# Patient Record
Sex: Male | Born: 1955 | Hispanic: No | Smoking: Never smoker
Health system: Southern US, Community
[De-identification: ages and names within clinical notes are randomized; demographics above are authoritative.]

## PROBLEM LIST (undated history)

## (undated) DIAGNOSIS — E119 Type 2 diabetes mellitus without complications: Secondary | ICD-10-CM

## (undated) DIAGNOSIS — I10 Essential (primary) hypertension: Secondary | ICD-10-CM

## (undated) HISTORY — PX: HERNIA REPAIR: SHX51

---

## 2010-11-06 ENCOUNTER — Emergency Department: Payer: Self-pay | Admitting: Unknown Physician Specialty

## 2010-11-23 ENCOUNTER — Ambulatory Visit: Payer: Self-pay | Admitting: Family Medicine

## 2011-07-05 ENCOUNTER — Emergency Department: Payer: Self-pay | Admitting: *Deleted

## 2011-07-05 LAB — RAPID INFLUENZA A&B ANTIGENS

## 2011-07-07 ENCOUNTER — Emergency Department: Payer: Self-pay | Admitting: *Deleted

## 2011-07-07 LAB — CBC
HCT: 40.7 % (ref 40.0–52.0)
HGB: 14 g/dL (ref 13.0–18.0)
MCH: 29 pg (ref 26.0–34.0)
MCHC: 34.4 g/dL (ref 32.0–36.0)
MCV: 84 fL (ref 80–100)
Platelet: 203 10*3/uL (ref 150–440)
RBC: 4.82 10*6/uL (ref 4.40–5.90)
RDW: 13.1 % (ref 11.5–14.5)
WBC: 8.7 10*3/uL (ref 3.8–10.6)

## 2011-07-07 LAB — COMPREHENSIVE METABOLIC PANEL
Alkaline Phosphatase: 56 U/L (ref 50–136)
Anion Gap: 8 (ref 7–16)
Bilirubin,Total: 1.1 mg/dL — ABNORMAL HIGH (ref 0.2–1.0)
Calcium, Total: 9.2 mg/dL (ref 8.5–10.1)
Chloride: 96 mmol/L — ABNORMAL LOW (ref 98–107)
Co2: 31 mmol/L (ref 21–32)
Creatinine: 0.96 mg/dL (ref 0.60–1.30)
EGFR (African American): >60
Potassium: 4.6 mmol/L (ref 3.5–5.1)
SGOT(AST): 18 U/L (ref 15–37)
Sodium: 135 mmol/L — ABNORMAL LOW (ref 136–145)

## 2011-07-07 LAB — LIPASE, BLOOD: Lipase: 107 U/L (ref 73–393)

## 2011-07-08 ENCOUNTER — Emergency Department: Payer: Self-pay | Admitting: Emergency Medicine

## 2011-07-08 LAB — LIPASE, BLOOD: Lipase: 179 U/L (ref 73–393)

## 2011-07-08 LAB — COMPREHENSIVE METABOLIC PANEL
Alkaline Phosphatase: 52 U/L (ref 50–136)
Bilirubin,Total: 0.8 mg/dL (ref 0.2–1.0)
Calcium, Total: 9 mg/dL (ref 8.5–10.1)
Chloride: 100 mmol/L (ref 98–107)
Co2: 29 mmol/L (ref 21–32)
Osmolality: 288 (ref 275–301)
SGPT (ALT): 25 U/L
Sodium: 140 mmol/L (ref 136–145)

## 2011-07-08 LAB — CBC
HCT: 40.3 % (ref 40.0–52.0)
HGB: 13.8 g/dL (ref 13.0–18.0)
MCHC: 34.2 g/dL (ref 32.0–36.0)
MCV: 85 fL (ref 80–100)
RBC: 4.77 10*6/uL (ref 4.40–5.90)
WBC: 5.6 10*3/uL (ref 3.8–10.6)

## 2011-07-08 LAB — TROPONIN I: Troponin-I: <0.02 ng/mL

## 2011-07-25 ENCOUNTER — Emergency Department: Payer: Self-pay | Admitting: Emergency Medicine

## 2011-12-04 ENCOUNTER — Emergency Department: Payer: Self-pay | Admitting: Emergency Medicine

## 2011-12-06 ENCOUNTER — Emergency Department: Payer: Self-pay | Admitting: Emergency Medicine

## 2012-06-24 HISTORY — PX: BACK SURGERY: SHX140

## 2012-10-09 ENCOUNTER — Emergency Department: Payer: Self-pay | Admitting: Emergency Medicine

## 2012-10-09 LAB — COMPREHENSIVE METABOLIC PANEL
Alkaline Phosphatase: 70 U/L (ref 50–136)
Bilirubin,Total: 0.9 mg/dL (ref 0.2–1.0)
Calcium, Total: 9.3 mg/dL (ref 8.5–10.1)
Chloride: 102 mmol/L (ref 98–107)
Co2: 27 mmol/L (ref 21–32)
Creatinine: 0.95 mg/dL (ref 0.60–1.30)
Glucose: 131 mg/dL — ABNORMAL HIGH (ref 65–99)
Potassium: 4.2 mmol/L (ref 3.5–5.1)
SGPT (ALT): 24 U/L (ref 12–78)
Sodium: 136 mmol/L (ref 136–145)

## 2012-10-09 LAB — CBC
HCT: 45.9 % (ref 40.0–52.0)
HGB: 15.6 g/dL (ref 13.0–18.0)
MCH: 28.7 pg (ref 26.0–34.0)
MCHC: 34 g/dL (ref 32.0–36.0)
MCV: 84 fL (ref 80–100)
WBC: 10.3 10*3/uL (ref 3.8–10.6)

## 2012-10-09 LAB — URINALYSIS, COMPLETE
Bilirubin,UR: NEGATIVE
Glucose,UR: NEGATIVE mg/dL (ref 0–75)
Ketone: NEGATIVE
RBC,UR: 1 /HPF (ref 0–5)
Specific Gravity: 1.027 (ref 1.003–1.030)
WBC UR: <1 /HPF (ref 0–5)

## 2012-12-13 ENCOUNTER — Emergency Department: Payer: Self-pay | Admitting: Emergency Medicine

## 2012-12-13 LAB — CBC
HCT: 40 % (ref 40.0–52.0)
HGB: 13.7 g/dL (ref 13.0–18.0)
MCV: 83 fL (ref 80–100)
Platelet: 238 10*3/uL (ref 150–440)
RDW: 12.9 % (ref 11.5–14.5)
WBC: 7.1 10*3/uL (ref 3.8–10.6)

## 2012-12-13 LAB — COMPREHENSIVE METABOLIC PANEL
Albumin: 3.7 g/dL (ref 3.4–5.0)
Alkaline Phosphatase: 71 U/L (ref 50–136)
Anion Gap: 7 (ref 7–16)
BUN: 25 mg/dL — ABNORMAL HIGH (ref 7–18)
Bilirubin,Total: 0.5 mg/dL (ref 0.2–1.0)
Chloride: 102 mmol/L (ref 98–107)
Creatinine: 0.91 mg/dL (ref 0.60–1.30)
EGFR (Non-African Amer.): >60
Glucose: 243 mg/dL — ABNORMAL HIGH (ref 65–99)
Osmolality: 290 (ref 275–301)
Potassium: 3.9 mmol/L (ref 3.5–5.1)
SGOT(AST): 15 U/L (ref 15–37)
SGPT (ALT): 18 U/L (ref 12–78)
Total Protein: 7.6 g/dL (ref 6.4–8.2)

## 2012-12-13 LAB — URINALYSIS, COMPLETE
Bacteria: NONE SEEN
Bilirubin,UR: NEGATIVE
Blood: NEGATIVE
Glucose,UR: >=500 mg/dL (ref 0–75)
Nitrite: NEGATIVE
Ph: 5 (ref 4.5–8.0)
Protein: 30
RBC,UR: 3 /HPF (ref 0–5)
Specific Gravity: 1.026 (ref 1.003–1.030)
Squamous Epithelial: NONE SEEN
WBC UR: 1 /HPF (ref 0–5)

## 2012-12-13 LAB — LIPASE, BLOOD: Lipase: 216 U/L (ref 73–393)

## 2012-12-31 ENCOUNTER — Ambulatory Visit: Payer: Self-pay | Admitting: Surgery

## 2013-01-13 ENCOUNTER — Ambulatory Visit: Payer: Self-pay | Admitting: Surgery

## 2013-04-20 ENCOUNTER — Ambulatory Visit: Payer: Self-pay | Admitting: Family Medicine

## 2013-05-08 ENCOUNTER — Emergency Department (HOSPITAL_COMMUNITY)
Admission: EM | Admit: 2013-05-08 | Discharge: 2013-05-08 | Disposition: A | Discharge status: Home / Self Care | Payer: No Typology Code available for payment source | Attending: Emergency Medicine | Admitting: Emergency Medicine

## 2013-05-08 ENCOUNTER — Emergency Department (HOSPITAL_COMMUNITY): Payer: No Typology Code available for payment source

## 2013-05-08 ENCOUNTER — Encounter (HOSPITAL_COMMUNITY): Payer: Self-pay | Admitting: Emergency Medicine

## 2013-05-08 DIAGNOSIS — M51379 Other intervertebral disc degeneration, lumbosacral region without mention of lumbar back pain or lower extremity pain: Secondary | ICD-10-CM | POA: Insufficient documentation

## 2013-05-08 DIAGNOSIS — Y92410 Unspecified street and highway as the place of occurrence of the external cause: Secondary | ICD-10-CM | POA: Insufficient documentation

## 2013-05-08 DIAGNOSIS — M5136 Other intervertebral disc degeneration, lumbar region: Secondary | ICD-10-CM

## 2013-05-08 DIAGNOSIS — E119 Type 2 diabetes mellitus without complications: Secondary | ICD-10-CM | POA: Insufficient documentation

## 2013-05-08 DIAGNOSIS — S4980XA Other specified injuries of shoulder and upper arm, unspecified arm, initial encounter: Secondary | ICD-10-CM | POA: Insufficient documentation

## 2013-05-08 DIAGNOSIS — S46909A Unspecified injury of unspecified muscle, fascia and tendon at shoulder and upper arm level, unspecified arm, initial encounter: Secondary | ICD-10-CM | POA: Insufficient documentation

## 2013-05-08 DIAGNOSIS — M5030 Other cervical disc degeneration, unspecified cervical region: Secondary | ICD-10-CM

## 2013-05-08 DIAGNOSIS — M5137 Other intervertebral disc degeneration, lumbosacral region: Secondary | ICD-10-CM | POA: Insufficient documentation

## 2013-05-08 DIAGNOSIS — Y9389 Activity, other specified: Secondary | ICD-10-CM | POA: Insufficient documentation

## 2013-05-08 DIAGNOSIS — M51369 Other intervertebral disc degeneration, lumbar region without mention of lumbar back pain or lower extremity pain: Secondary | ICD-10-CM

## 2013-05-08 DIAGNOSIS — Z791 Long term (current) use of non-steroidal anti-inflammatories (NSAID): Secondary | ICD-10-CM | POA: Insufficient documentation

## 2013-05-08 HISTORY — DX: Type 2 diabetes mellitus without complications: E11.9

## 2013-05-08 MED ORDER — OXYCODONE-ACETAMINOPHEN 5-325 MG PO TABS
2.0000 | ORAL_TABLET | Freq: Once | ORAL | Status: AC
  Administered 2013-05-08: 2 via ORAL
  Filled 2013-05-08: qty 2

## 2013-05-08 MED ORDER — OXYCODONE-ACETAMINOPHEN 5-325 MG PO TABS: 1.0000 | ORAL_TABLET | Freq: Four times a day (QID) | ORAL | Status: DC | PRN

## 2013-05-08 MED ORDER — NAPROXEN 500 MG PO TABS: 500.0000 mg | ORAL_TABLET | Freq: Two times a day (BID) | ORAL | Status: DC
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
## 2013-05-08 NOTE — ED Notes (Signed)
Pt at xray. Family at bedside

## 2013-08-04 ENCOUNTER — Ambulatory Visit: Payer: Self-pay | Admitting: Family Medicine

## 2013-08-17 ENCOUNTER — Ambulatory Visit: Payer: Self-pay | Admitting: Neurosurgery

## 2013-11-05 DIAGNOSIS — S32009A Unspecified fracture of unspecified lumbar vertebra, initial encounter for closed fracture: Secondary | ICD-10-CM | POA: Insufficient documentation

## 2014-07-07 ENCOUNTER — Ambulatory Visit: Payer: Self-pay | Admitting: Family Medicine

## 2014-07-08 ENCOUNTER — Emergency Department: Payer: Self-pay | Admitting: Emergency Medicine

## 2014-07-08 LAB — COMPREHENSIVE METABOLIC PANEL
ALK PHOS: 68 U/L
AST: 13 U/L — AB (ref 15–37)
Albumin: 3.9 g/dL (ref 3.4–5.0)
Anion Gap: 9 (ref 7–16)
BILIRUBIN TOTAL: 1.1 mg/dL — AB (ref 0.2–1.0)
BUN: 26 mg/dL — AB (ref 7–18)
CREATININE: 0.94 mg/dL (ref 0.60–1.30)
Calcium, Total: 9.2 mg/dL (ref 8.5–10.1)
Chloride: 102 mmol/L (ref 98–107)
Co2: 28 mmol/L (ref 21–32)
EGFR (African American): >60
EGFR (Non-African Amer.): >60
Glucose: 252 mg/dL — ABNORMAL HIGH (ref 65–99)
Osmolality: 291 (ref 275–301)
Potassium: 4.1 mmol/L (ref 3.5–5.1)
SGPT (ALT): 17 U/L
Sodium: 139 mmol/L (ref 136–145)
Total Protein: 7.9 g/dL (ref 6.4–8.2)

## 2014-07-08 LAB — CBC WITH DIFFERENTIAL/PLATELET
BASOS ABS: 0 10*3/uL (ref 0.0–0.1)
Basophil %: 0.6 %
Eosinophil #: 0.2 10*3/uL (ref 0.0–0.7)
Eosinophil %: 2.6 %
HCT: 40.6 % (ref 40.0–52.0)
HGB: 13.6 g/dL (ref 13.0–18.0)
Lymphocyte #: 1.1 10*3/uL (ref 1.0–3.6)
Lymphocyte %: 17.3 %
MCH: 28.5 pg (ref 26.0–34.0)
MCHC: 33.4 g/dL (ref 32.0–36.0)
MCV: 85 fL (ref 80–100)
MONOS PCT: 7.6 %
Monocyte #: 0.5 x10 3/mm (ref 0.2–1.0)
Neutrophil #: 4.7 10*3/uL (ref 1.4–6.5)
Neutrophil %: 71.9 %
PLATELETS: 190 10*3/uL (ref 150–440)
RBC: 4.77 10*6/uL (ref 4.40–5.90)
RDW: 14.5 % (ref 11.5–14.5)
WBC: 6.5 10*3/uL (ref 3.8–10.6)

## 2014-07-08 LAB — URIC ACID: URIC ACID: 3.7 mg/dL (ref 3.5–7.2)

## 2014-07-08 LAB — SEDIMENTATION RATE: Erythrocyte Sed Rate: 55 mm/hr — ABNORMAL HIGH (ref 0–20)

## 2014-10-14 NOTE — Op Note (Signed)
PATIENT NAME:  Daniel Powell, Daniel Powell MR#:  [id number] DATE OF BIRTH:  March 07, 1956  DATE OF PROCEDURE:  01/13/2013  ATTENDING PHYSICIAN: Daniel Gave A. Warrick Llera, MD  ASSISTANT: Daniel Goldmann III, MD  PREOPERATIVE DIAGNOSES: Left inguinal hernia, umbilical hernia.  POSTOPERATIVE DIAGNOSES: Left indirect inguinal hernia, umbilical hernia.  PROCEDURE PERFORMED:  1. Robotic left inguinal hernia repair with mesh, transabdominal preperitoneal.  2. Umbilical hernia repair.   ESTIMATED BLOOD LOSS: 10 mL.  COMPLICATIONS: None.   SPECIMENS: None.   ANESTHESIA: General.   INDICATION FOR SURGERY: Daniel Powell is a pleasant 59 year old male with a long history of a left inguinal hernia which has currently gotten worse. Due to the size of it, I thought that he would benefit from robotic-assisted laparoscopic inguinal hernia repair.   DETAILS OF PROCEDURE: As follows: Informed consent was obtained. Daniel Powell was brought to the operating room suite. He was placed upon the operating room table. He was induced, endotracheal tube was placed, general anesthesia was administered. A timeout was then performed correctly identifying the patient's name, operative site and procedure to be performed. A supraumbilical incision was made. This was deepened down to the fascia. The fascia was incised. The peritoneum was entered. I did not go through the previous hernia. I later would return to fix this. I did place the laparoscopic 8 mm trocar through the umbilical incision. I then, under direct visualization, placed two 8 mm robotic trocars, on the left approximately 1 handbreadth left of the umbilicus and 1 handbreadth to the right of the umbilicus. I then placed a more superior 10 mm trocar on the left side. I then docked the robot and made an incision in the peritoneum above the hernia. There was a large indirect hernia with the large sac. I then carefully dissected the peritoneum off the abdominal wall and dissected out the indirect  sac. Once it was reduced, I dissected out Cooper's and then repaired the hernia defect with a single piece of 6 x 4 cm polypropylene mesh, and this was tacked medially to Cooper's ligament, superior to the abdominal wall to cover all 3 defects. I closed the peritoneal flap over the mesh and tacked those in place. I then removed the trocars and undocked the robot. I then went around Daniel Powell's umbilicus and isolated the umbilical hernia defect. It was quite small, and it did appear to be scarred down. It did not warrant mesh, and therefore I closed the umbilical defect as well as my umbilical port site with figure-of-eight 0 Vicryl. I then closed the left upper quadrant umbilical port site fascia with a single 0 Vicryl figure-of-eight. I then closed all incisions with 4-0 Monocryl deep dermal sutures and placed Dermabond over all incisions. The patient was then awoken, extubated and brought to the postanesthesia care unit. There were no immediate complications. Needle, sponge and instrument counts were correct at the end of the procedure.   ____________________________ Daniel Powell. Daniel Sing, MD cal:OSi D: 01/13/2013 12:07:31 ET T: 01/13/2013 13:39:20 ET JOB#: [id number]  cc: Daniel Gave A. Sang Blount, MD, <Dictator> Floyde Parkins MD ELECTRONICALLY SIGNED 01/16/2013 11:36

## 2014-10-21 ENCOUNTER — Other Ambulatory Visit: Payer: Self-pay | Admitting: Family Medicine

## 2014-10-21 DIAGNOSIS — R519 Headache, unspecified: Secondary | ICD-10-CM

## 2014-10-21 DIAGNOSIS — R51 Headache: Principal | ICD-10-CM

## 2014-10-28 ENCOUNTER — Ambulatory Visit
Admission: RE | Admit: 2014-10-28 | Discharge: 2014-10-28 | Disposition: A | Discharge status: Home / Self Care | Payer: BLUE CROSS/BLUE SHIELD | Source: Ambulatory Visit | Attending: Family Medicine | Admitting: Family Medicine

## 2014-10-28 ENCOUNTER — Encounter (INDEPENDENT_AMBULATORY_CARE_PROVIDER_SITE_OTHER): Payer: Self-pay

## 2014-10-28 DIAGNOSIS — R519 Headache, unspecified: Secondary | ICD-10-CM

## 2014-10-28 DIAGNOSIS — R51 Headache: Secondary | ICD-10-CM | POA: Diagnosis not present

## 2014-10-28 MED ORDER — GADOBENATE DIMEGLUMINE 529 MG/ML IV SOLN
15.0000 mL | Freq: Once | INTRAVENOUS | Status: AC | PRN
  Administered 2014-10-28: 14 mL via INTRAVENOUS

## 2014-11-25 ENCOUNTER — Ambulatory Visit: Payer: Self-pay

## 2014-11-30 ENCOUNTER — Encounter (INDEPENDENT_AMBULATORY_CARE_PROVIDER_SITE_OTHER): Payer: Self-pay | Admitting: Ophthalmology

## 2015-02-21 ENCOUNTER — Ambulatory Visit (INDEPENDENT_AMBULATORY_CARE_PROVIDER_SITE_OTHER): Payer: BLUE CROSS/BLUE SHIELD | Admitting: Family Medicine

## 2015-02-21 ENCOUNTER — Encounter: Payer: Self-pay | Admitting: Family Medicine

## 2015-02-21 VITALS — BP 128/66 | HR 74 | Temp 97.5°F | Resp 16 | Ht 66.0 in | Wt 155.5 lb

## 2015-02-21 DIAGNOSIS — L739 Follicular disorder, unspecified: Secondary | ICD-10-CM | POA: Diagnosis not present

## 2015-02-21 MED ORDER — CEPHALEXIN 500 MG PO CAPS: 500.0000 mg | ORAL_CAPSULE | Freq: Four times a day (QID) | ORAL | Status: DC

## 2015-02-21 NOTE — Progress Notes (Signed)
Name: Daniel Powell   MRN: [id number]    DOB: [birth year]   Date:02/21/2015       Progress Note  Subjective  Chief Complaint  Chief Complaint  Patient presents with  . Rash    patient presents with rash on the back of his scalp, on top of his scalp, bilateral arms. patient states the area is painful, itchy and it burns.  patient has tried some otc liquid and creams.  . Medication Refill    for diabetes and cholesterol. patient is requesting a 90 day supply.    Rash This is a new problem. The current episode started in the past 7 days. The affected locations include the head and scalp. The rash is characterized by blistering and redness. Pertinent negatives include no fever. Past treatments include nothing.    Past Medical History  Diagnosis Date  . Diabetes mellitus without complication     History reviewed. No pertinent past surgical history.  History reviewed. No pertinent family history.  Social History   Social History  . Marital Status: Married    Spouse Name: N/A  . Number of Children: N/A  . Years of Education: N/A   Occupational History  . Not on file.   Social History Main Topics  . Smoking status: Never Smoker   . Smokeless tobacco: Not on file  . Alcohol Use: No  . Drug Use: No  . Sexual Activity: Not on file   Other Topics Concern  . Not on file   Social History Narrative     Current outpatient prescriptions:  .  glimepiride (AMARYL) 4 MG tablet, Take 4 mg by mouth., Disp: , Rfl:  .  metFORMIN (GLUMETZA) 1000 MG (MOD) 24 hr tablet, Take 1,000 mg by mouth., Disp: , Rfl:  .  naproxen (NAPROSYN) 500 MG tablet, , Disp: , Rfl:  .  oxyCODONE (OXY IR/ROXICODONE) 5 MG immediate release tablet, Take 5 mg by mouth., Disp: , Rfl:  .  oxyCODONE-acetaminophen (PERCOCET/ROXICET) 5-325 MG per tablet, , Disp: , Rfl:  .  simvastatin (ZOCOR) 40 MG tablet, Take 40 mg by mouth., Disp: , Rfl:   Allergies  Allergen Reactions  . Gadolinium Derivatives Itching    Pt  began itching across ribs and back and neck s/p Multihance injection    Review of Systems  Constitutional: Negative for fever.  Skin: Positive for itching and rash.    Objective  Filed Vitals:   02/21/15 1231  BP: 128/66  Pulse: 74  Temp: 97.5 F (36.4 C)  TempSrc: Oral  Resp: 16  Height: 5\' 6"  (1.676 m)  Weight: 155 lb 8 oz (70.534 kg)  SpO2: 97%    Physical Exam  Constitutional: He is well-developed, well-nourished, and in no distress.  Skin: Rash noted. Rash is pustular and vesicular.  Non-tender indurated, pustular lesions over the occipital scalp. Some lesions are scabbed over, some have dried pus.  Nursing note and vitals reviewed.  Assessment & Plan  1. Folliculitis  - cephALEXin (KEFLEX) 500 MG capsule; Take 1 capsule (500 mg total) by mouth 4 (four) times daily.  Dispense: 40 capsule; Refill: 0   [name] [facility] 02/21/2015 1:30 PM

## 2015-02-28 ENCOUNTER — Ambulatory Visit: Payer: BLUE CROSS/BLUE SHIELD | Admitting: Family Medicine

## 2015-03-01 ENCOUNTER — Encounter: Payer: Self-pay | Admitting: Family Medicine

## 2015-03-01 ENCOUNTER — Ambulatory Visit: Payer: BLUE CROSS/BLUE SHIELD | Admitting: Family Medicine

## 2015-03-01 ENCOUNTER — Ambulatory Visit (INDEPENDENT_AMBULATORY_CARE_PROVIDER_SITE_OTHER): Payer: BLUE CROSS/BLUE SHIELD | Admitting: Family Medicine

## 2015-03-01 VITALS — BP 130/68 | HR 74 | Temp 97.6°F | Resp 18 | Ht 66.0 in | Wt 156.1 lb

## 2015-03-01 DIAGNOSIS — Z23 Encounter for immunization: Secondary | ICD-10-CM | POA: Diagnosis not present

## 2015-03-01 DIAGNOSIS — E1165 Type 2 diabetes mellitus with hyperglycemia: Secondary | ICD-10-CM | POA: Diagnosis not present

## 2015-03-01 DIAGNOSIS — E785 Hyperlipidemia, unspecified: Secondary | ICD-10-CM | POA: Insufficient documentation

## 2015-03-01 DIAGNOSIS — IMO0002 Reserved for concepts with insufficient information to code with codable children: Secondary | ICD-10-CM | POA: Insufficient documentation

## 2015-03-01 DIAGNOSIS — R51 Headache: Secondary | ICD-10-CM

## 2015-03-01 DIAGNOSIS — R519 Headache, unspecified: Secondary | ICD-10-CM | POA: Insufficient documentation

## 2015-03-01 DIAGNOSIS — M546 Pain in thoracic spine: Secondary | ICD-10-CM | POA: Insufficient documentation

## 2015-03-01 MED ORDER — SIMVASTATIN 40 MG PO TABS: 40.0000 mg | ORAL_TABLET | Freq: Every day | ORAL | Status: DC

## 2015-03-01 MED ORDER — GLIMEPIRIDE 4 MG PO TABS: 4.0000 mg | ORAL_TABLET | Freq: Every day | ORAL | Status: DC

## 2015-03-01 MED ORDER — LINAGLIPTIN 5 MG PO TABS: 5.0000 mg | ORAL_TABLET | Freq: Every day | ORAL | Status: DC

## 2015-03-01 MED ORDER — METFORMIN HCL 1000 MG PO TABS: 1000.0000 mg | ORAL_TABLET | Freq: Two times a day (BID) | ORAL | Status: DC

## 2015-03-01 NOTE — Progress Notes (Signed)
Name: Daniel Powell   MRN: [id number]    DOB: January 16, 1956   Date:03/01/2015       Progress Note  Subjective  Chief Complaint  Chief Complaint  Patient presents with  . Follow-up    1 wk  . Diabetes    Diabetes He presents for his follow-up diabetic visit. He has type 2 diabetes mellitus. His disease course has been stable. Pertinent negatives for diabetes include no weight loss. Current diabetic treatment includes oral agent (dual therapy). He is following a diabetic diet.  Hyperlipidemia This is a chronic problem. The problem is uncontrolled. Recent lipid tests were reviewed and are normal (elevated TG). Pertinent negatives include no leg pain, myalgias or shortness of breath. Current antihyperlipidemic treatment includes statins.  Back Pain This is a chronic problem. The pain is present in the lumbar spine and thoracic spine. The pain does not radiate. The pain is at a severity of 6/10. The pain is the same all the time. The symptoms are aggravated by sitting. Associated symptoms include numbness (numbness and tingling in his legs.). Pertinent negatives include no fever, leg pain or weight loss.  Headache Pt. Has persistent headaches and wishes to be referred to neurology.patient also claims to have 'dementia.' Of note, previous workup for headaches have included a normal MRI of the brain.    Past Medical History  Diagnosis Date  . Diabetes mellitus without complication     History reviewed. No pertinent past surgical history.  History reviewed. No pertinent family history.  Social History   Social History  . Marital Status: Married    Spouse Name: N/A  . Number of Children: N/A  . Years of Education: N/A   Occupational History  . Not on file.   Social History Main Topics  . Smoking status: Never Smoker   . Smokeless tobacco: Not on file  . Alcohol Use: No  . Drug Use: No  . Sexual Activity: Not on file   Other Topics Concern  . Not on file   Social History  Narrative     Current outpatient prescriptions:  .  cephALEXin (KEFLEX) 500 MG capsule, Take 1 capsule (500 mg total) by mouth 4 (four) times daily., Disp: 40 capsule, Rfl: 0 .  glimepiride (AMARYL) 4 MG tablet, Take 4 mg by mouth., Disp: , Rfl:  .  metFORMIN (GLUMETZA) 1000 MG (MOD) 24 hr tablet, Take 1,000 mg by mouth., Disp: , Rfl:  .  naproxen (NAPROSYN) 500 MG tablet, , Disp: , Rfl:  .  oxyCODONE (OXY IR/ROXICODONE) 5 MG immediate release tablet, Take 5 mg by mouth., Disp: , Rfl:  .  oxyCODONE-acetaminophen (PERCOCET/ROXICET) 5-325 MG per tablet, , Disp: , Rfl:  .  simvastatin (ZOCOR) 40 MG tablet, Take 40 mg by mouth., Disp: , Rfl:   Allergies  Allergen Reactions  . Gadolinium Derivatives Itching    Pt began itching across ribs and back and neck s/p Multihance injection     Review of Systems  Constitutional: Negative for fever, chills, weight loss and malaise/fatigue.  Respiratory: Negative for shortness of breath.   Musculoskeletal: Positive for back pain. Negative for myalgias.  Neurological: Positive for numbness (numbness and tingling in his legs.).   Objective  Filed Vitals:   03/01/15 1540  BP: 130/68  Pulse: 74  Temp: 97.6 F (36.4 C)  TempSrc: Oral  Resp: 18  Height: 5\' 6"  (1.676 m)  Weight: 156 lb 1.6 oz (70.806 kg)  SpO2: 98%    Physical Exam  Constitutional: He is oriented to person, place, and time and well-developed, well-nourished, and in no distress.  Cardiovascular: Normal rate and regular rhythm.   Pulmonary/Chest: Effort normal and breath sounds normal.  Musculoskeletal:       Thoracic back: He exhibits pain. He exhibits no spasm.       Lumbar back: He exhibits no pain.  Neurological: He is alert and oriented to person, place, and time.  Nursing note and vitals reviewed.   Assessment & Plan  1. Need for immunization against influenza Vaccine against influenza provided.  2. Diabetes mellitus type 2, uncontrolled  - HgB A1c -  glimepiride (AMARYL) 4 MG tablet; Take 1 tablet (4 mg total) by mouth daily with breakfast.  Dispense: 90 tablet; Refill: 0 - linagliptin (TRADJENTA) 5 MG TABS tablet; Take 1 tablet (5 mg total) by mouth daily.  Dispense: 90 tablet; Refill: 0 - metFORMIN (GLUCOPHAGE) 1000 MG tablet; Take 1 tablet (1,000 mg total) by mouth 2 (two) times daily with a meal.  Dispense: 180 tablet; Refill: 1  3. Dyslipidemia  - Lipid Profile - COMPLETE METABOLIC PANEL WITH GFR - simvastatin (ZOCOR) 40 MG tablet; Take 1 tablet (40 mg total) by mouth daily at 6 PM.  Dispense: 90 tablet; Refill: 0  4. Bilateral thoracic back pain Patient is requesting a referral to neurosurgery for further evaluation of thoracic low back pain.  - Ambulatory referral to Neurosurgery  5. Persistent headaches Referral to neurology for evaluation of persistent headaches.  - Ambulatory referral to Neurology  Services of Arabic interpreter were utilized during this office visit. Arabic interpreter ID [phone].     Daenerys Buttram [facility] 03/01/2015 4:07 PM

## 2015-03-03 LAB — COMPREHENSIVE METABOLIC PANEL
A/G RATIO: 1.5 (ref 1.1–2.5)
ALK PHOS: 66 IU/L (ref 39–117)
ALT: 15 IU/L (ref 0–44)
AST: 16 IU/L (ref 0–40)
Albumin: 4.3 g/dL (ref 3.5–5.5)
BUN/Creatinine Ratio: 21 — ABNORMAL HIGH (ref 9–20)
BUN: 19 mg/dL (ref 6–24)
Bilirubin Total: 0.5 mg/dL (ref 0.0–1.2)
CO2: 26 mmol/L (ref 18–29)
Calcium: 9.7 mg/dL (ref 8.7–10.2)
Chloride: 100 mmol/L (ref 97–108)
Creatinine, Ser: 0.9 mg/dL (ref 0.76–1.27)
GFR calc Af Amer: 109 mL/min/{1.73_m2} (ref >59)
GFR, EST NON AFRICAN AMERICAN: 94 mL/min/{1.73_m2} (ref >59)
GLOBULIN, TOTAL: 2.9 g/dL (ref 1.5–4.5)
Glucose: 234 mg/dL — ABNORMAL HIGH (ref 65–99)
Potassium: 4.8 mmol/L (ref 3.5–5.2)
SODIUM: 143 mmol/L (ref 134–144)
Total Protein: 7.2 g/dL (ref 6.0–8.5)

## 2015-03-03 LAB — LIPID PANEL
CHOL/HDL RATIO: 6.3 ratio — AB (ref 0.0–5.0)
Cholesterol, Total: 190 mg/dL (ref 100–199)
HDL: 30 mg/dL — AB (ref >39)
Triglycerides: 510 mg/dL — ABNORMAL HIGH (ref 0–149)

## 2015-03-03 LAB — HEMOGLOBIN A1C
Est. average glucose Bld gHb Est-mCnc: 192 mg/dL
HEMOGLOBIN A1C: 8.3 % — AB (ref 4.8–5.6)

## 2015-04-14 IMAGING — CR DG THORACIC SPINE 2-3V
1 series · 4 of 4 positions shown · non-contrast
Comparison: none

REASON FOR EXAM: low back pain
COMMENTS:

PROCEDURE:     KDR - KDXR THORACIC AP AND LATERAL  - April 20, 2013 [DATE]
RESULT:     Two-view thoracic spine

[Series 1: ap · 0.17mm/px · 4 of 4 slices shown]
[im 1/4]
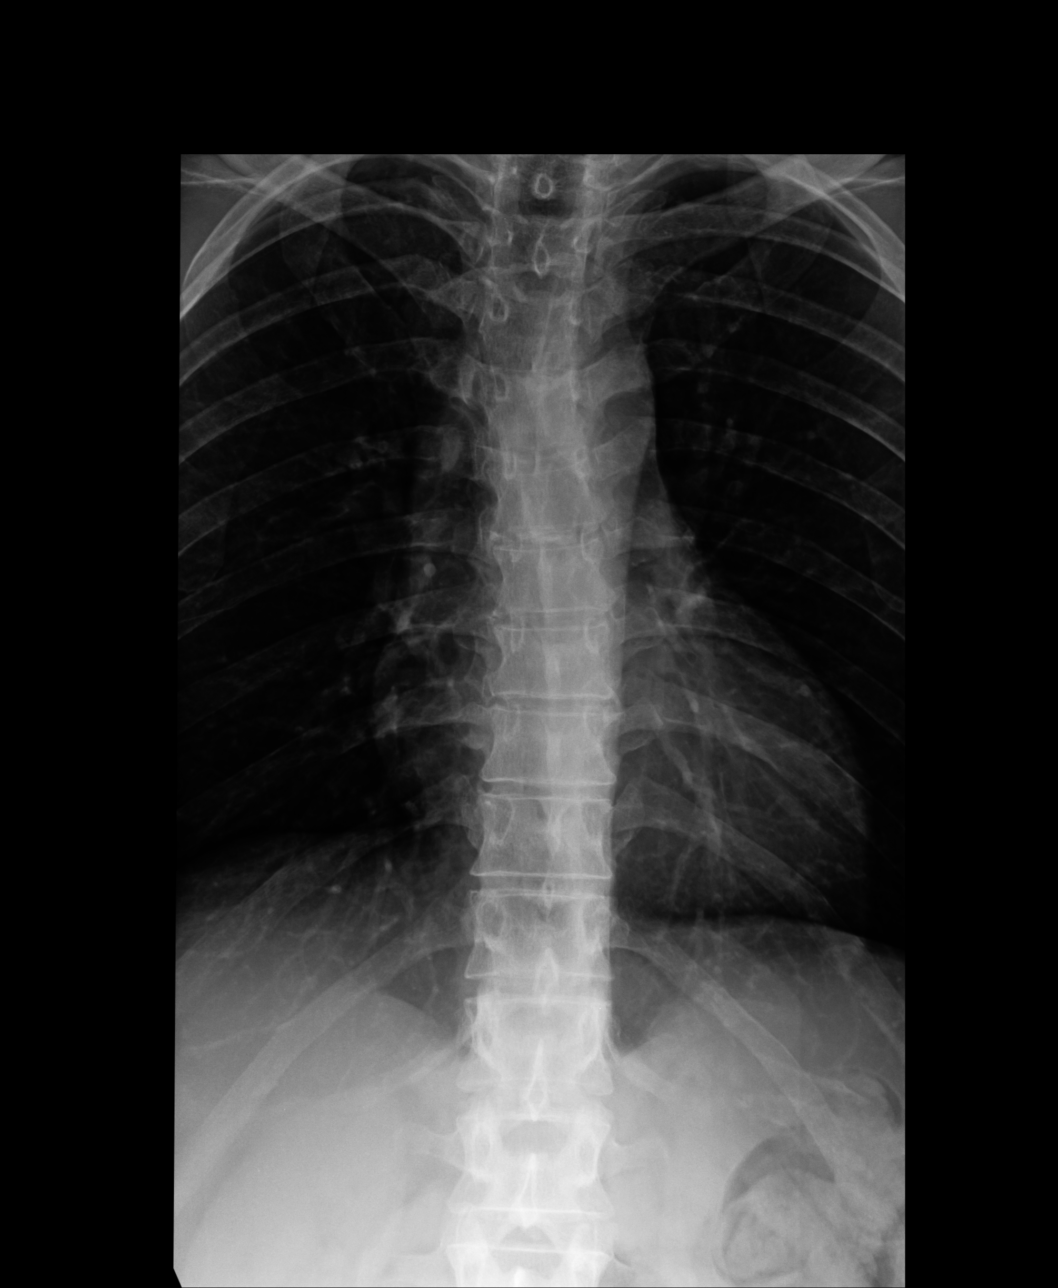
[im 2/4]
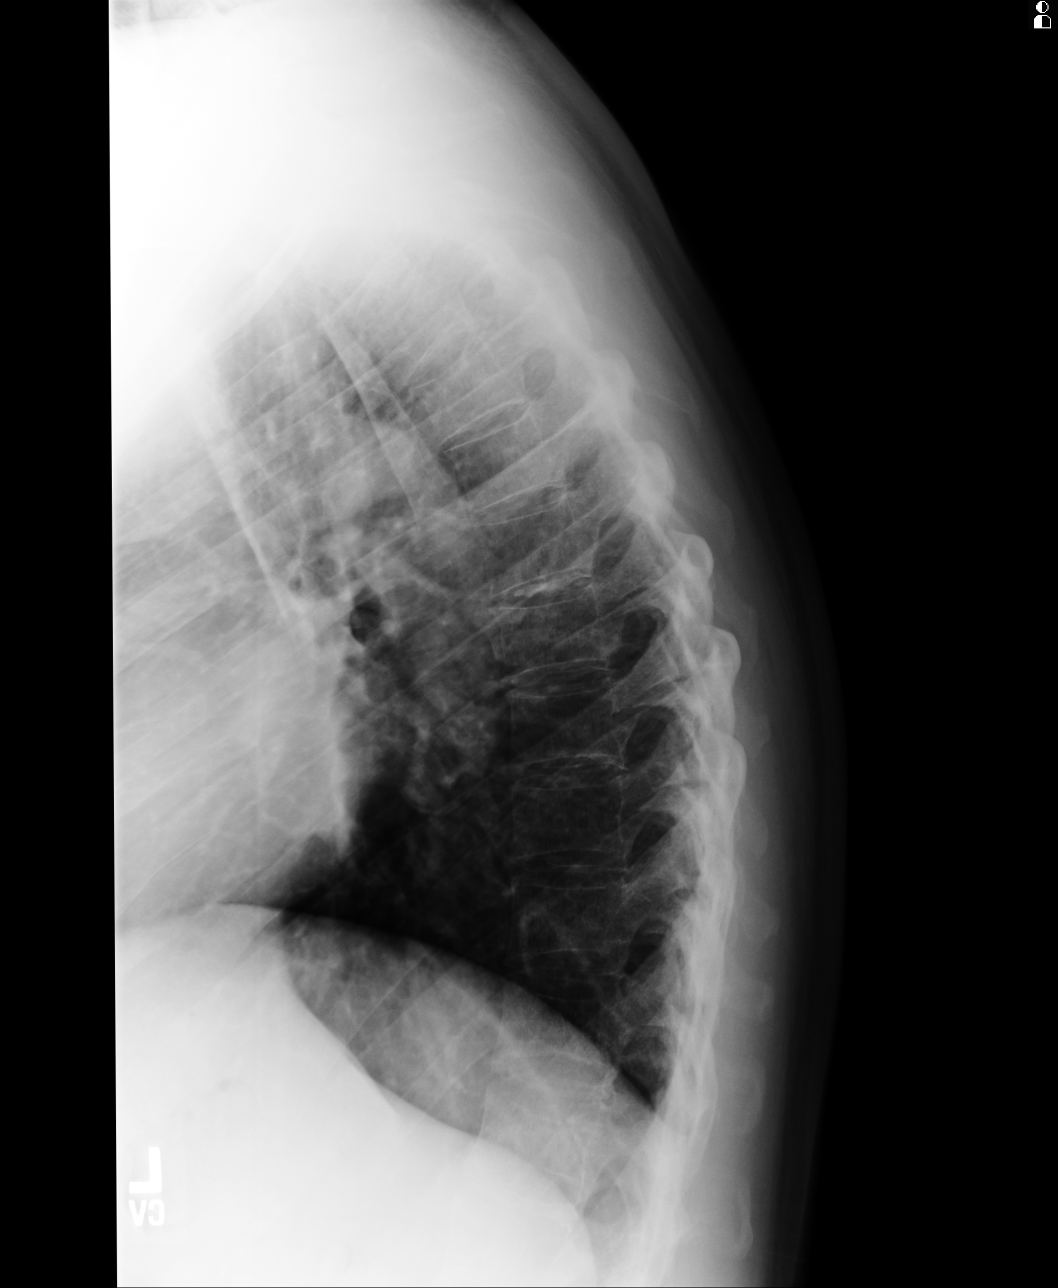
[im 3/4]
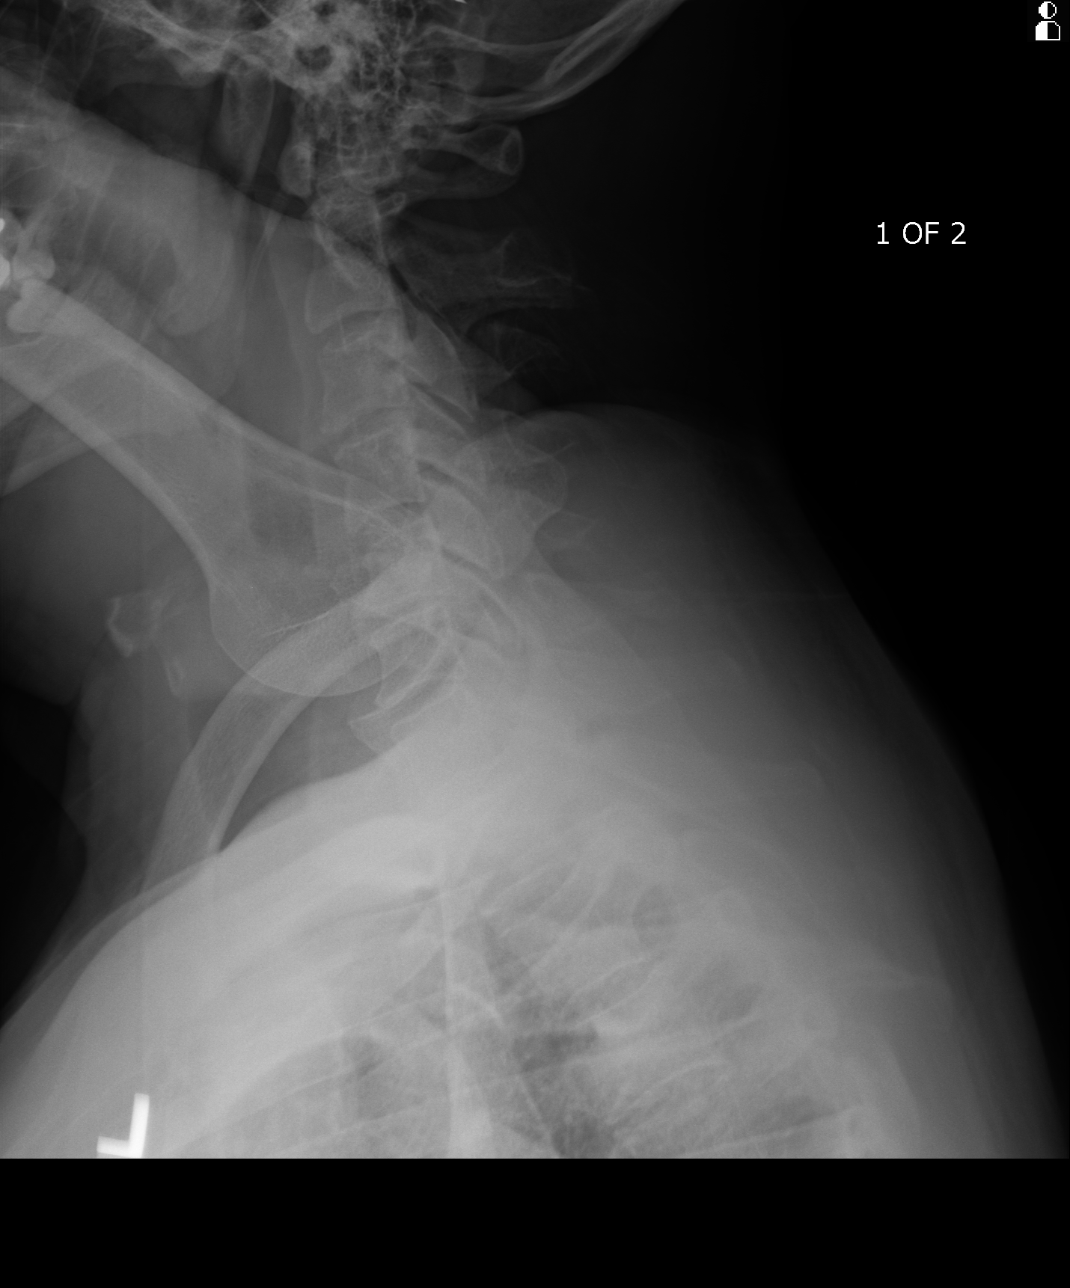
[im 4/4]
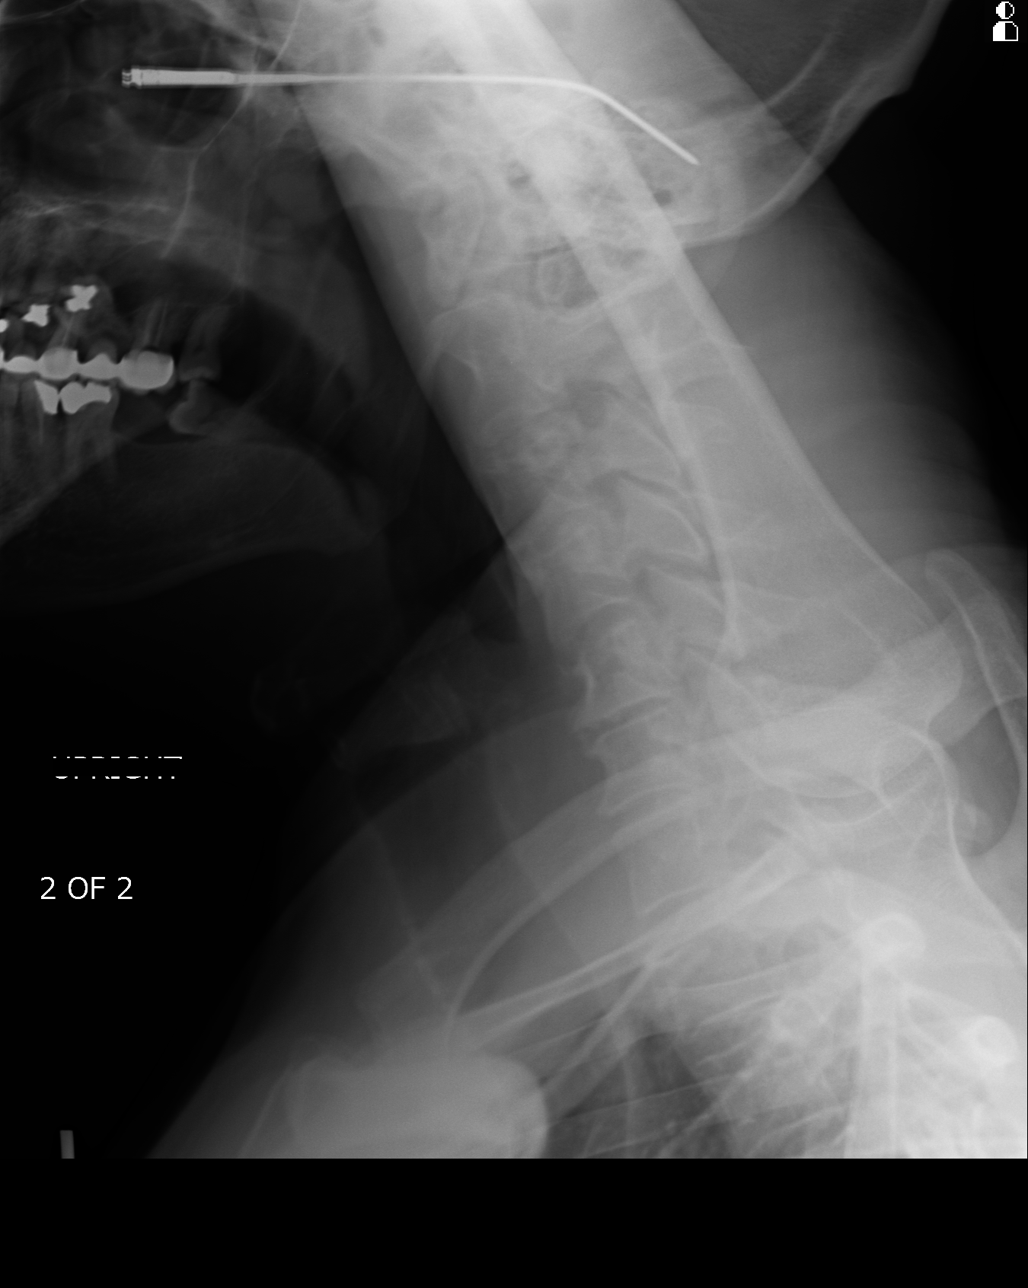

[4 of 4 positions shown; findings below may reference images not displayed]

FINDINGS: There is no evidence of acute fracture, dislocation, nor
malalignment. If there is persistent clinical concern further evaluation
with MRI is recommended.
IMPRESSION: No evidence of acute osseous abnormalities.

## 2015-06-21 ENCOUNTER — Telehealth: Payer: Self-pay | Admitting: Internal Medicine

## 2015-06-21 NOTE — Telephone Encounter (Signed)
Pt needs refills on simvastatin and Tradjenta to be sent to [facility]. Call pt at [phone]

## 2015-06-22 ENCOUNTER — Other Ambulatory Visit: Payer: Self-pay | Admitting: Family Medicine

## 2015-06-23 NOTE — Telephone Encounter (Signed)
Left voicemail asking patient to call and schedule medication refill appointment

## 2015-06-28 ENCOUNTER — Telehealth: Payer: Self-pay

## 2015-06-28 DIAGNOSIS — E1165 Type 2 diabetes mellitus with hyperglycemia: Principal | ICD-10-CM

## 2015-06-28 DIAGNOSIS — IMO0001 Reserved for inherently not codable concepts without codable children: Secondary | ICD-10-CM

## 2015-06-28 MED ORDER — LINAGLIPTIN 5 MG PO TABS: 5.0000 mg | ORAL_TABLET | Freq: Every day | ORAL | Status: DC

## 2015-06-28 NOTE — Telephone Encounter (Signed)
Tradjenta 5 mg has been refilled and sent to [facility]

## 2015-07-27 ENCOUNTER — Encounter: Payer: Self-pay | Admitting: Family Medicine

## 2015-07-27 ENCOUNTER — Ambulatory Visit (INDEPENDENT_AMBULATORY_CARE_PROVIDER_SITE_OTHER): Payer: Self-pay | Admitting: Family Medicine

## 2015-07-27 VITALS — BP 122/74 | HR 83 | Temp 97.9°F | Resp 16 | Ht 66.0 in | Wt 159.1 lb

## 2015-07-27 DIAGNOSIS — E1165 Type 2 diabetes mellitus with hyperglycemia: Secondary | ICD-10-CM

## 2015-07-27 DIAGNOSIS — L739 Follicular disorder, unspecified: Secondary | ICD-10-CM

## 2015-07-27 DIAGNOSIS — E781 Pure hyperglyceridemia: Secondary | ICD-10-CM | POA: Insufficient documentation

## 2015-07-27 DIAGNOSIS — IMO0001 Reserved for inherently not codable concepts without codable children: Secondary | ICD-10-CM

## 2015-07-27 DIAGNOSIS — E785 Hyperlipidemia, unspecified: Secondary | ICD-10-CM

## 2015-07-27 LAB — POCT GLYCOSYLATED HEMOGLOBIN (HGB A1C): HEMOGLOBIN A1C: 8.1

## 2015-07-27 LAB — POCT UA - MICROALBUMIN: Microalbumin Ur, POC: NEGATIVE mg/L

## 2015-07-27 LAB — GLUCOSE, POCT (MANUAL RESULT ENTRY): POC GLUCOSE: 171 mg/dL — AB (ref 70–99)

## 2015-07-27 MED ORDER — METFORMIN HCL 1000 MG PO TABS: 1000.0000 mg | ORAL_TABLET | Freq: Two times a day (BID) | ORAL | Status: DC

## 2015-07-27 MED ORDER — LINAGLIPTIN 5 MG PO TABS: 5.0000 mg | ORAL_TABLET | Freq: Every day | ORAL | Status: DC

## 2015-07-27 MED ORDER — CEPHALEXIN 500 MG PO CAPS: 500.0000 mg | ORAL_CAPSULE | Freq: Three times a day (TID) | ORAL | Status: DC

## 2015-07-27 MED ORDER — GLIMEPIRIDE 4 MG PO TABS: 4.0000 mg | ORAL_TABLET | Freq: Every day | ORAL | Status: DC

## 2015-07-27 MED ORDER — SIMVASTATIN 40 MG PO TABS: 40.0000 mg | ORAL_TABLET | Freq: Every day | ORAL | Status: DC

## 2015-07-27 NOTE — Progress Notes (Signed)
Name: Daniel Powell   MRN: [id number]    DOB: 1956/05/22   Date:07/27/2015       Progress Note  Subjective  Chief Complaint  Chief Complaint  Patient presents with  . Medication Refill    5 month follow-up  . Diabetes    Checks glucose 2x day high-255, low-171  . Hyperlipidemia  . Bite    all on head states they come and go.  onest 8+ years.  Patient describes as sore and itchy.    Diabetes He presents for his follow-up diabetic visit. He has type 2 diabetes mellitus. His disease course has been stable. Associated symptoms include chest pain (chest pain from coughing). Pertinent negatives for diabetes include no fatigue, no foot paresthesias, no polydipsia, no polyuria and no weight loss. Pertinent negatives for diabetic complications include no heart disease. Current diabetic treatment includes oral agent (triple therapy). He is following a diabetic diet. He rarely (does not do any exercise.) participates in exercise. He monitors blood glucose at home 1-2 x per day (once a day to once every 2 days). His dinner blood glucose is taken after 8 pm. His dinner blood glucose range is generally 180-200 mg/dl. An ACE inhibitor/angiotensin II receptor blocker is not being taken.  Hyperlipidemia This is a chronic problem. The problem is uncontrolled. Recent lipid tests were reviewed and are high (Elevated Triglycerides). Exacerbating diseases include diabetes. Associated symptoms include chest pain (chest pain from coughing). Pertinent negatives include no leg pain, myalgias or shortness of breath. Current antihyperlipidemic treatment includes statins. There are no compliance problems.  Risk factors for coronary artery disease include dyslipidemia, male sex and diabetes mellitus.  Rash This is a recurrent problem. The current episode started more than 1 month ago. The affected locations include the scalp. The rash is characterized by blistering and draining. Pertinent negatives include no fatigue, fever or  shortness of breath.    Past Medical History  Diagnosis Date  . Diabetes mellitus without complication ([place])     No past surgical history on file.  No family history on file.  Social History   Social History  . Marital Status: Married    Spouse Name: N/A  . Number of Children: N/A  . Years of Education: N/A   Occupational History  . Not on file.   Social History Main Topics  . Smoking status: Never Smoker   . Smokeless tobacco: Not on file  . Alcohol Use: No  . Drug Use: No  . Sexual Activity: Not on file   Other Topics Concern  . Not on file   Social History Narrative     Current outpatient prescriptions:  .  glimepiride (AMARYL) 4 MG tablet, Take 1 tablet (4 mg total) by mouth daily with breakfast., Disp: 90 tablet, Rfl: 0 .  linagliptin (TRADJENTA) 5 MG TABS tablet, Take 1 tablet (5 mg total) by mouth daily., Disp: 90 tablet, Rfl: 0 .  metFORMIN (GLUCOPHAGE) 1000 MG tablet, Take 1 tablet (1,000 mg total) by mouth 2 (two) times daily with a meal., Disp: 180 tablet, Rfl: 1 .  naproxen (NAPROSYN) 500 MG tablet, , Disp: , Rfl:  .  simvastatin (ZOCOR) 40 MG tablet, Take 1 tablet (40 mg total) by mouth daily at 6 PM., Disp: 90 tablet, Rfl: 0  Allergies  Allergen Reactions  . Gadolinium Derivatives Itching    Pt began itching across ribs and back and neck s/p Multihance injection     Review of Systems  Constitutional: Negative for  fever, chills, weight loss and fatigue.  Respiratory: Negative for shortness of breath.   Cardiovascular: Positive for chest pain (chest pain from coughing).  Musculoskeletal: Negative for myalgias.  Skin: Positive for itching and rash.  Endo/Heme/Allergies: Negative for polydipsia.    Objective  Filed Vitals:   07/27/15 1101  BP: 122/74  Pulse: 83  Temp: 97.9 F (36.6 C)  TempSrc: Oral  Resp: 16  Height: 5\' 6"  (1.676 m)  Weight: 159 lb 1.6 oz (72.167 kg)  SpO2: 97%    Physical Exam  Constitutional: He is oriented to  person, place, and time and well-developed, well-nourished, and in no distress.  Cardiovascular: Normal rate and regular rhythm.   Pulmonary/Chest: Effort normal and breath sounds normal.  Abdominal: Soft. Bowel sounds are normal.  Neurological: He is alert and oriented to person, place, and time.  Skin: Skin is warm and dry. Rash noted. Rash is maculopapular and pustular. There is erythema.  Macular, pruritic, erythematous scattered lesions on the scalp, no drainage visible.  Nursing note and vitals reviewed.     Recent Results (from the past 2160 hour(s))  POCT HgB A1C     Status: Abnormal   Collection Time: 07/27/15 11:11 AM  Result Value Ref Range   Hemoglobin A1C 8.1      Assessment & Plan  1. Uncontrolled type 2 diabetes mellitus without complication, without long-term current use of insulin (HCC)  - POCT UA - Microalbumin - POCT HgB A1C - POCT Glucose (CBG) - glimepiride (AMARYL) 4 MG tablet; Take 1 tablet (4 mg total) by mouth daily with breakfast.  Dispense: 90 tablet; Refill: 0 - metFORMIN (GLUCOPHAGE) 1000 MG tablet; Take 1 tablet (1,000 mg total) by mouth 2 (two) times daily with a meal.  Dispense: 180 tablet; Refill: 1 - linagliptin (TRADJENTA) 5 MG TABS tablet; Take 1 tablet (5 mg total) by mouth daily.  Dispense: 90 tablet; Refill: 0  2. Folliculitis  - cephALEXin (KEFLEX) 500 MG capsule; Take 1 capsule (500 mg total) by mouth 3 (three) times daily.  Dispense: 30 capsule; Refill: 0  3. Dyslipidemia  - Lipid Profile - Comprehensive Metabolic Panel (CMET) - simvastatin (ZOCOR) 40 MG tablet; Take 1 tablet (40 mg total) by mouth daily at 6 PM.  Dispense: 90 tablet; Refill: 0  4. Hypertriglyceridemia Repeat triglycerides and consider starting on EPA therapy. - Lipid Profile  Interpreter ID [id number].   [name] [facility] 07/27/2015 11:16 AM

## 2015-08-15 DIAGNOSIS — Z79899 Other long term (current) drug therapy: Secondary | ICD-10-CM | POA: Insufficient documentation

## 2015-08-15 DIAGNOSIS — J209 Acute bronchitis, unspecified: Secondary | ICD-10-CM | POA: Insufficient documentation

## 2015-08-15 DIAGNOSIS — E119 Type 2 diabetes mellitus without complications: Secondary | ICD-10-CM | POA: Insufficient documentation

## 2015-08-15 DIAGNOSIS — Z7984 Long term (current) use of oral hypoglycemic drugs: Secondary | ICD-10-CM | POA: Insufficient documentation

## 2015-08-15 DIAGNOSIS — Z792 Long term (current) use of antibiotics: Secondary | ICD-10-CM | POA: Insufficient documentation

## 2015-08-15 LAB — CBC WITH DIFFERENTIAL/PLATELET
BASOS ABS: 0 10*3/uL (ref 0–0.1)
BASOS PCT: 1 %
EOS ABS: 0.2 10*3/uL (ref 0–0.7)
Eosinophils Relative: 3 %
HCT: 40.6 % (ref 40.0–52.0)
Hemoglobin: 13.9 g/dL (ref 13.0–18.0)
Lymphocytes Relative: 23 %
Lymphs Abs: 1.6 10*3/uL (ref 1.0–3.6)
MCH: 28.1 pg (ref 26.0–34.0)
MCHC: 34.3 g/dL (ref 32.0–36.0)
MCV: 81.9 fL (ref 80.0–100.0)
MONO ABS: 0.6 10*3/uL (ref 0.2–1.0)
MONOS PCT: 9 %
NEUTROS ABS: 4.5 10*3/uL (ref 1.4–6.5)
NEUTROS PCT: 64 %
Platelets: 217 10*3/uL (ref 150–440)
RBC: 4.96 MIL/uL (ref 4.40–5.90)
RDW: 14.1 % (ref 11.5–14.5)
WBC: 6.9 10*3/uL (ref 3.8–10.6)

## 2015-08-15 NOTE — ED Notes (Signed)
Pt arrived to ED with c/o cough with shortness of breath. Pt reports cough started 3 weeks ago. Pt c/o "burning with coughing started tonight".  [employer], [employer] used.

## 2015-08-16 ENCOUNTER — Emergency Department
Admission: EM | Admit: 2015-08-16 | Discharge: 2015-08-16 | Disposition: A | Discharge status: Home / Self Care | Payer: Self-pay | Attending: Emergency Medicine | Admitting: Emergency Medicine

## 2015-08-16 ENCOUNTER — Emergency Department: Payer: Self-pay

## 2015-08-16 DIAGNOSIS — R059 Cough, unspecified: Secondary | ICD-10-CM

## 2015-08-16 DIAGNOSIS — J40 Bronchitis, not specified as acute or chronic: Secondary | ICD-10-CM

## 2015-08-16 DIAGNOSIS — R05 Cough: Secondary | ICD-10-CM

## 2015-08-16 LAB — COMPREHENSIVE METABOLIC PANEL
ALK PHOS: 55 U/L (ref 38–126)
ALT: 16 U/L — AB (ref 17–63)
ANION GAP: 8 (ref 5–15)
AST: 17 U/L (ref 15–41)
Albumin: 4.5 g/dL (ref 3.5–5.0)
BUN: 23 mg/dL — ABNORMAL HIGH (ref 6–20)
CALCIUM: 9.5 mg/dL (ref 8.9–10.3)
CO2: 29 mmol/L (ref 22–32)
CREATININE: 0.92 mg/dL (ref 0.61–1.24)
Chloride: 101 mmol/L (ref 101–111)
Glucose, Bld: 188 mg/dL — ABNORMAL HIGH (ref 65–99)
Potassium: 4.1 mmol/L (ref 3.5–5.1)
Sodium: 138 mmol/L (ref 135–145)
TOTAL PROTEIN: 7.7 g/dL (ref 6.5–8.1)
Total Bilirubin: 0.8 mg/dL (ref 0.3–1.2)

## 2015-08-16 LAB — TROPONIN I

## 2015-08-16 MED ORDER — PREDNISONE 20 MG PO TABS: 60.0000 mg | ORAL_TABLET | Freq: Every day | ORAL | Status: DC

## 2015-08-16 MED ORDER — BENZONATATE 100 MG PO CAPS: 100.0000 mg | ORAL_CAPSULE | Freq: Four times a day (QID) | ORAL | Status: DC | PRN

## 2015-08-16 MED ORDER — AZITHROMYCIN 250 MG PO TABS: ORAL_TABLET | ORAL | Status: AC

## 2015-08-16 MED ORDER — BENZONATATE 100 MG PO CAPS
100.0000 mg | ORAL_CAPSULE | Freq: Once | ORAL | Status: AC
  Administered 2015-08-16: 100 mg via ORAL
  Filled 2015-08-16: qty 1

## 2015-08-16 MED ORDER — IPRATROPIUM-ALBUTEROL 0.5-2.5 (3) MG/3ML IN SOLN
3.0000 mL | Freq: Once | RESPIRATORY_TRACT | Status: AC
  Administered 2015-08-16: 3 mL via RESPIRATORY_TRACT
  Filled 2015-08-16: qty 3

## 2015-08-16 MED ORDER — PREDNISONE 20 MG PO TABS
60.0000 mg | ORAL_TABLET | Freq: Once | ORAL | Status: AC
  Administered 2015-08-16: 60 mg via ORAL
  Filled 2015-08-16: qty 3

## 2015-08-16 MED ORDER — HYDROCOD POLST-CPM POLST ER 10-8 MG/5ML PO SUER
5.0000 mL | Freq: Once | ORAL | Status: DC
  Filled 2015-08-16: qty 5

## 2015-08-16 MED ORDER — ALBUTEROL SULFATE HFA 108 (90 BASE) MCG/ACT IN AERS: 2.0000 | INHALATION_SPRAY | Freq: Four times a day (QID) | RESPIRATORY_TRACT | Status: DC | PRN

## 2015-08-16 NOTE — ED Notes (Signed)
MD at bedside. 

## 2015-08-16 NOTE — ED Notes (Signed)
Assessment done via interpreter

## 2015-08-16 NOTE — ED Notes (Signed)
MD at bedside using interpreter  

## 2015-08-16 NOTE — ED Notes (Signed)
Waiting for interpreter via [place]

## 2015-08-16 NOTE — Discharge Instructions (Signed)
Cough, Adult °Coughing is a reflex that clears your throat and your airways. Coughing helps to heal and protect your lungs. It is normal to cough occasionally, but a cough that happens with other symptoms or lasts a long time may be a sign of a condition that needs treatment. A cough may last only 2-3 weeks (acute), or it may last longer than 8 weeks (chronic). °CAUSES °Coughing is commonly caused by: °· Breathing in substances that irritate your lungs. °· A viral or bacterial respiratory infection. °· Allergies. °· Asthma. °· Postnasal drip. °· Smoking. °· Acid backing up from the stomach into the esophagus (gastroesophageal reflux). °· Certain medicines. °· Chronic lung problems, including COPD (or rarely, lung cancer). °· Other medical conditions such as heart failure. °HOME CARE INSTRUCTIONS  °Pay attention to any changes in your symptoms. Take these actions to help with your discomfort: °· Take medicines only as told by your health care provider. °· If you were prescribed an antibiotic medicine, take it as told by your health care provider. Do not stop taking the antibiotic even if you start to feel better. °· Talk with your health care provider before you take a cough suppressant medicine. °· Drink enough fluid to keep your urine clear or pale yellow. °· If the air is dry, use a cold steam vaporizer or humidifier in your bedroom or your home to help loosen secretions. °· Avoid anything that causes you to cough at work or at home. °· If your cough is worse at night, try sleeping in a semi-upright position. °· Avoid cigarette smoke. If you smoke, quit smoking. If you need help quitting, ask your health care provider. °· Avoid caffeine. °· Avoid alcohol. °· Rest as needed. °SEEK MEDICAL CARE IF:  °· You have new symptoms. °· You cough up pus. °· Your cough does not get better after 2-3 weeks, or your cough gets worse. °· You cannot control your cough with suppressant medicines and you are losing sleep. °· You  develop pain that is getting worse or pain that is not controlled with pain medicines. °· You have a fever. °· You have unexplained weight loss. °· You have night sweats. °SEEK IMMEDIATE MEDICAL CARE IF: °· You cough up blood. °· You have difficulty breathing. °· Your heartbeat is very fast. °  °This information is not intended to replace advice given to you by your health care provider. Make sure you discuss any questions you have with your health care provider. °  °Document Released: 12/07/2010 Document Revised: 03/01/2015 Document Reviewed: 08/17/2014 °Elsevier Interactive Patient Education ©2016 Elsevier Inc[employer] ° °Upper Respiratory Infection, Adult °Most upper respiratory infections (URIs) are a viral infection of the air passages leading to the lungs. A URI affects the nose, throat, and upper air passages. The most common type of URI is nasopharyngitis and is typically referred to as "the common cold." °URIs run their course and usually go away on their own. Most of the time, a URI does not require medical attention, but sometimes a bacterial infection in the upper airways can follow a viral infection. This is called a secondary infection. Sinus and middle ear infections are common types of secondary upper respiratory infections. °Bacterial pneumonia can also complicate a URI. A URI can worsen asthma and chronic obstructive pulmonary disease (COPD). Sometimes, these complications can require emergency medical care and may be life threatening.  °CAUSES °Almost all URIs are caused by viruses. A virus is a type of germ and can spread from one   person to another.  °RISKS FACTORS °You may be at risk for a URI if:  °· You smoke.   °· You have chronic heart or lung disease. °· You have a weakened defense (immune) system.   °· You are very young or very old.   °· You have nasal allergies or asthma. °· You work in crowded or poorly ventilated areas. °· You work in health care facilities or schools. °SIGNS AND SYMPTOMS    °Symptoms typically develop 2-3 days after you come in contact with a cold virus. Most viral URIs last 7-10 days. However, viral URIs from the influenza virus (flu virus) can last 14-18 days and are typically more severe. Symptoms may include:  °· Runny or stuffy (congested) nose.   °· Sneezing.   °· Cough.   °· Sore throat.   °· Headache.   °· Fatigue.   °· Fever.   °· Loss of appetite.   °· Pain in your forehead, behind your eyes, and over your cheekbones (sinus pain). °· Muscle aches.   °DIAGNOSIS  °Your health care provider may diagnose a URI by: °· Physical exam. °· Tests to check that your symptoms are not due to another condition such as: °¨ Strep throat. °¨ Sinusitis. °¨ Pneumonia. °¨ Asthma. °TREATMENT  °A URI goes away on its own with time. It cannot be cured with medicines, but medicines may be prescribed or recommended to relieve symptoms. Medicines may help: °· Reduce your fever. °· Reduce your cough. °· Relieve nasal congestion. °HOME CARE INSTRUCTIONS  °· Take medicines only as directed by your health care provider.   °· Gargle warm saltwater or take cough drops to comfort your throat as directed by your health care provider. °· Use a warm mist humidifier or inhale steam from a shower to increase air moisture. This may make it easier to breathe. °· Drink enough fluid to keep your urine clear or pale yellow.   °· Eat soups and other clear broths and maintain good nutrition.   °· Rest as needed.   °· Return to work when your temperature has returned to normal or as your health care provider advises. You may need to stay home longer to avoid infecting others. You can also use a face mask and careful hand washing to prevent spread of the virus. °· Increase the usage of your inhaler if you have asthma.   °· Do not use any tobacco products, including cigarettes, chewing tobacco, or electronic cigarettes. If you need help quitting, ask your health care provider. °PREVENTION  °The best way to protect  yourself from getting a cold is to practice good hygiene.  °· Avoid oral or hand contact with people with cold symptoms.   °· Wash your hands often if contact occurs.   °There is no clear evidence that vitamin C, vitamin E, echinacea, or exercise reduces the chance of developing a cold. However, it is always recommended to get plenty of rest, exercise, and practice good nutrition.  °SEEK MEDICAL CARE IF:  °· You are getting worse rather than better.   °· Your symptoms are not controlled by medicine.   °· You have chills. °· You have worsening shortness of breath. °· You have brown or red mucus. °· You have yellow or brown nasal discharge. °· You have pain in your face, especially when you bend forward. °· You have a fever. °· You have swollen neck glands. °· You have pain while swallowing. °· You have white areas in the back of your throat. °SEEK IMMEDIATE MEDICAL CARE IF:  °· You have severe or persistent: °¨ Headache. °¨ Ear pain. °¨   Sinus pain. °¨ Chest pain. °· You have chronic lung disease and any of the following: °¨ Wheezing. °¨ Prolonged cough. °¨ Coughing up blood. °¨ A change in your usual mucus. °· You have a stiff neck. °· You have changes in your: °¨ Vision. °¨ Hearing. °¨ Thinking. °¨ Mood. °MAKE SURE YOU:  °· Understand these instructions. °· Will watch your condition. °· Will get help right away if you are not doing well or get worse. °  °This information is not intended to replace advice given to you by your health care provider. Make sure you discuss any questions you have with your health care provider. °  °Document Released: 12/04/2000 Document Revised: 10/25/2014 Document Reviewed: 09/15/2013 °Elsevier Interactive Patient Education ©2016 Elsevier Inc. ° °

## 2015-08-16 NOTE — ED Provider Notes (Signed)
[facility] Emergency Department Provider Note  ____________________________________________  Time seen: Approximately 0055 AM  I have reviewed the triage vital signs and the nursing notes.   HISTORY  Chief Complaint Cough  Patient Arabic and history obtained through language line  HPI Daniel Powell is a 60 y.o. male who comes into the hospital stay with a severe cough. The patient reports that the cough started 3 weeks ago. Within the last few days it seems to be getting worse. The patient reports that he didn't have any strong symptoms initially. Every time he coughs he has some chest tightness and pain with difficulty breathing. He has been taking some over-the-counter medicine but he does not know the name of it. He reports it has not been helping. The symptoms started with the patient's daughter and they went to his brother and now he has the symptoms. The patient's daughter and brother recovered but he is still having the symptoms. He does not know he's had any fevers but he's had some occasional warm sensation in his mouth and chest. He is asking for breathing treatment at this time. He does not smoke and he's never had asthma before. He reports that the cough is dry and nonproductive but he occasionally has a bitter taste in the back of his throat.   Past Medical History  Diagnosis Date  . Diabetes mellitus without complication [facility])     Patient Active Problem List   Diagnosis Date Noted  . Hypertriglyceridemia 07/27/2015  . Need for immunization against influenza 03/01/2015  . Diabetes mellitus type 2, uncontrolled ([place]) 03/01/2015  . Dyslipidemia 03/01/2015  . Back pain, thoracic 03/01/2015  . Persistent headaches 03/01/2015  . Folliculitis [id number]  . Fracture of lumbar spine ([place]) 11/05/2013    History reviewed. No pertinent past surgical history.  Current Outpatient Rx  Name  Route  Sig  Dispense  Refill  . cephALEXin (KEFLEX) 500 MG  capsule   Oral   Take 1 capsule (500 mg total) by mouth 3 (three) times daily.   30 capsule   0   . glimepiride (AMARYL) 4 MG tablet   Oral   Take 1 tablet (4 mg total) by mouth daily with breakfast.   90 tablet   0   . linagliptin (TRADJENTA) 5 MG TABS tablet   Oral   Take 1 tablet (5 mg total) by mouth daily.   90 tablet   0   . metFORMIN (GLUCOPHAGE) 1000 MG tablet   Oral   Take 1 tablet (1,000 mg total) by mouth 2 (two) times daily with a meal.   180 tablet   1   . naproxen (NAPROSYN) 500 MG tablet               . simvastatin (ZOCOR) 40 MG tablet   Oral   Take 1 tablet (40 mg total) by mouth daily at 6 PM.   90 tablet   0     Allergies Gadolinium derivatives  History reviewed. No pertinent family history.  Social History Social History  Substance Use Topics  . Smoking status: Never Smoker   . Smokeless tobacco: None  . Alcohol Use: No    Review of Systems Constitutional: No fever/chills Eyes: No visual changes. ENT: No sore throat. Cardiovascular:  chest pain. Respiratory: Cough and shortness of breath Gastrointestinal: No abdominal pain.  No nausea, no vomiting.  No diarrhea.  No constipation. Genitourinary: Negative for dysuria. Musculoskeletal: Negative for back pain. Skin: Negative for  rash. Neurological: Negative for headaches, focal weakness or numbness.  10-point ROS otherwise negative.  ____________________________________________   PHYSICAL EXAM:  VITAL SIGNS: ED Triage Vitals  Enc Vitals Group     BP 08/15/15 2324 162/62 mmHg     Pulse Rate 08/15/15 2324 90     Resp 08/15/15 2324 17     Temp 08/15/15 2324 97.9 F (36.6 C)     Temp Source 08/15/15 2324 Oral     SpO2 08/15/15 2324 98 %     Weight 08/15/15 2324 150 lb (68.04 kg)     Height 08/15/15 2324 5\' 7"  (1.702 m)     Head Cir --      Peak Flow --      Pain Score --      Pain Loc --      Pain Edu? --      Excl. in [place]? --     Constitutional: Alert and oriented.  Well appearing and in moderate distress. Eyes: Conjunctivae are normal. PERRL. EOMI. Head: Atraumatic. Nose: No congestion/rhinnorhea. Mouth/Throat: Mucous membranes are moist.  Oropharynx non-erythematous. Cardiovascular: Normal rate, regular rhythm. Grossly normal heart sounds.  Good peripheral circulation. Respiratory: Normal respiratory effort.  No retractions. Occasional expiratory wheezes Gastrointestinal: Soft and nontender. No distention. Positive bowel sounds Musculoskeletal: No lower extremity tenderness nor edema.   Neurologic:  Normal speech and language.  Skin:  Skin is warm, dry and intact.  Psychiatric: Mood and affect are normal.   ____________________________________________   LABS (all labs ordered are listed, but only abnormal results are displayed)  Labs Reviewed  COMPREHENSIVE METABOLIC PANEL - Abnormal; Notable for the following:    Glucose, Bld 188 (*)    BUN 23 (*)    ALT 16 (*)    All other components within normal limits  CBC WITH DIFFERENTIAL/PLATELET  TROPONIN I   ____________________________________________  EKG  ED ECG REPORT I, Loney Hering, the attending physician, personally viewed and interpreted this ECG.   Date: 08/15/2015  EKG Time: 2347  Rate: 90  Rhythm: normal sinus rhythm  Axis: normal  Intervals:none  ST&T Change: none  ____________________________________________  RADIOLOGY  CXR: Borderline cardiomegaly, minimal right basilar atelectasis noted. ____________________________________________   PROCEDURES  Procedure(s) performed: None  Critical Care performed: No  ____________________________________________   INITIAL IMPRESSION / ASSESSMENT AND PLAN / ED COURSE  Pertinent labs & imaging results that were available during my care of the patient were reviewed by me and considered in my medical decision making (see chart for details).  The patient is 60 year old male who comes into the hospital today with some  cough. The patient has been having this cough for multiple weeks but it has not been getting better. I will give the patient a DuoNeb treatment as well as some prednisone and Tussionex. I will reassess the patient once he's had the treatment and I received the results of the chest x-ray.  After 2 breathing treatments and prednisone as well as benzonatate the patient's cough was improved. He will be discharged home with some prednisone and albuterol as well as a Z-Pak. The patient will follow up with his primary care physician. The patient has no further concerns or complaints at this time. ____________________________________________   FINAL CLINICAL IMPRESSION(S) / ED DIAGNOSES  Final diagnoses:  Bronchitis  Cough      Loney Hering, MD 08/16/15 [phone]

## 2016-01-17 ENCOUNTER — Emergency Department
Admission: EM | Admit: 2016-01-17 | Discharge: 2016-01-17 | Disposition: A | Discharge status: Home / Self Care | Payer: Self-pay | Attending: Emergency Medicine | Admitting: Emergency Medicine

## 2016-01-17 DIAGNOSIS — E1165 Type 2 diabetes mellitus with hyperglycemia: Secondary | ICD-10-CM | POA: Insufficient documentation

## 2016-01-17 DIAGNOSIS — L0201 Cutaneous abscess of face: Secondary | ICD-10-CM | POA: Insufficient documentation

## 2016-01-17 LAB — GLUCOSE, CAPILLARY: GLUCOSE-CAPILLARY: 326 mg/dL — AB (ref 65–99)

## 2016-01-17 MED ORDER — CEPHALEXIN 500 MG PO CAPS: 500.0000 mg | ORAL_CAPSULE | Freq: Two times a day (BID) | ORAL | 0 refills | Status: AC

## 2016-01-17 MED ORDER — IBUPROFEN 800 MG PO TABS
800.0000 mg | ORAL_TABLET | Freq: Once | ORAL | Status: AC
  Administered 2016-01-17: 800 mg via ORAL
  Filled 2016-01-17: qty 1

## 2016-01-17 MED ORDER — METFORMIN HCL 1000 MG PO TABS: 1000.0000 mg | ORAL_TABLET | Freq: Two times a day (BID) | ORAL | 0 refills | Status: DC

## 2016-01-17 MED ORDER — CEPHALEXIN 500 MG PO CAPS
500.0000 mg | ORAL_CAPSULE | Freq: Once | ORAL | Status: AC
  Administered 2016-01-17: 500 mg via ORAL
  Filled 2016-01-17: qty 1

## 2016-01-17 MED ORDER — METFORMIN HCL 500 MG PO TABS
1000.0000 mg | ORAL_TABLET | Freq: Once | ORAL | Status: AC
  Administered 2016-01-17: 1000 mg via ORAL
  Filled 2016-01-17: qty 2

## 2016-01-17 MED ORDER — SIMVASTATIN 40 MG PO TABS: 40.0000 mg | ORAL_TABLET | Freq: Every day | ORAL | 0 refills | Status: DC

## 2016-01-17 MED ORDER — GLIMEPIRIDE 2 MG PO TABS
4.0000 mg | ORAL_TABLET | Freq: Every day | ORAL | Status: DC
  Administered 2016-01-17: 4 mg via ORAL
  Filled 2016-01-17: qty 2

## 2016-01-17 MED ORDER — GLIMEPIRIDE 4 MG PO TABS: 4.0000 mg | ORAL_TABLET | Freq: Every day | ORAL | 0 refills | Status: DC

## 2016-01-17 NOTE — ED Notes (Signed)
Pt. Family Verbalizes understanding of d/c instructions, prescriptions, and follow-up. VS stable.  Pt. In NAD at time of d/c and denies concerns. Pt. Ambulatory Out of the unit with family displaying a steady gait

## 2016-01-17 NOTE — ED Notes (Signed)
Called pharmacy again requesting pt. Medication.

## 2016-01-17 NOTE — ED Triage Notes (Addendum)
Pt in with co left nasal pain with no injury x 3 days, now radiates to left side of face and head. Hx of the same and states was given antibiotics to abscess to nose. Pt speaks arabic has family as Optometrist.

## 2016-01-17 NOTE — ED Provider Notes (Signed)
[facility] Emergency Department Provider Note  ____________________________________________  Time seen: 4:30AM  I have reviewed the triage vital signs and the nursing notes.   HISTORY  Chief Complaint Facial Pain      HPI Daniel Powell is a 60 y.o. male with history of diabetes mellitus presents to the emergency department with "bump in his left nostril and left facial discomfort as well as "bump on his scalp". Patient denies any fever patient admits to previous facial abscess with spontaneous resolution. Patient also admits to being out of his metformin glimepiride and simvastatin for approximately 3 months and is requesting a refill at this time     Past medical history Diabetes mellitus type 2 Hypercholesterolemia There are no active problems to display for this patient.   Past surgical history None  Allergies No known drug allergies No family history on file.  Social History Social History  Substance Use Topics  . Smoking status: Not on file  . Smokeless tobacco: Not on file  . Alcohol use Not on file    Review of Systems  Constitutional: Negative for fever. Eyes: Negative for visual changes. ENT: Negative for sore throat. Cardiovascular: Negative for chest pain. Respiratory: Negative for shortness of breath. Gastrointestinal: Negative for abdominal pain, vomiting and diarrhea. Genitourinary: Negative for dysuria. Musculoskeletal: Negative for back pain. Skin: Positive for abscess. Neurological: Negative for headaches, focal weakness or numbness.   10-point ROS otherwise negative.  ____________________________________________   PHYSICAL EXAM:  VITAL SIGNS: ED Triage Vitals  Enc Vitals Group     BP 01/17/16 0315 (!) 160/73     Pulse Rate 01/17/16 0315 87     Resp 01/17/16 0315 18     Temp 01/17/16 0315 98.2 F (36.8 C)     Temp Source 01/17/16 0315 Oral     SpO2 01/17/16 0315 96 %     Weight 01/17/16 0315 160 lb (72.6  kg)     Height 01/17/16 0315 5\' 8"  (1.727 m)     Head Circumference --      Peak Flow --      Pain Score 01/17/16 0317 10     Pain Loc --      Pain Edu? --      Excl. in [place]? --     Constitutional: Alert and oriented. Well appearing and in no distress. Eyes: Conjunctivae are normal. PERRL. Normal extraocular movements. ENT   Head: Normocephalic and atraumatic.   Nose: No congestion/rhinnorhea.   Mouth/Throat: Mucous membranes are moist.   Neck: No stridor. Hematological/Lymphatic/Immunilogical: No cervical lymphadenopathy. Cardiovascular: Normal rate, regular rhythm. Normal and symmetric distal pulses are present in all extremities. No murmurs, rubs, or gallops. Respiratory: Normal respiratory effort without tachypnea nor retractions. Breath sounds are clear and equal bilaterally. No wheezes/rales/rhonchi. Gastrointestinal: Soft and nontender. No distention. There is no CVA tenderness. Genitourinary: deferred Musculoskeletal: Nontender with normal range of motion in all extremities. No joint effusions.  No lower extremity tenderness nor edema. Neurologic:  Normal speech and language. No gross focal neurologic deficits are appreciated. Speech is normal.  Skin:  Left naris draining pustule noted with surrounding erythema, subcentimeter pustule noted on the patient's scalp Psychiatric: Mood and affect are normal. Speech and behavior are normal. Patient exhibits appropriate insight and judgment.  ____________________________________________    LABS (pertinent positives/negatives)  Labs Reviewed  GLUCOSE, CAPILLARY - Abnormal; Notable for the following:       Result Value   Glucose-Capillary 326 (*)    All other components  within normal limits       Procedures    INITIAL IMPRESSION / ASSESSMENT AND PLAN / ED COURSE  Pertinent labs & imaging results that were available during my care of the patient were reviewed by me and considered in my medical decision making  (see chart for details).  Patient given Keflex 500 mg, number per eye 4 mg and metformin 1000 g in the emergency department will be prescribed same for home.  ____________________________________________   FINAL CLINICAL IMPRESSION(S) / ED DIAGNOSES  Final diagnoses:  Abscess of face  Type 2 diabetes mellitus with hyperglycemia, without long-term current use of insulin ([place])      Gregor Hams, MD 01/17/16 [phone]

## 2016-01-17 NOTE — ED Notes (Signed)
MD notified of pt. CBG.

## 2016-01-17 NOTE — ED Notes (Signed)
Called Pharmacy to send ordered medication, sending.

## 2016-01-17 NOTE — ED Notes (Signed)
MD at bedside. 

## 2016-06-26 ENCOUNTER — Encounter: Payer: Self-pay | Admitting: *Deleted

## 2016-06-26 ENCOUNTER — Emergency Department: Payer: Self-pay

## 2016-06-26 ENCOUNTER — Emergency Department
Admission: EM | Admit: 2016-06-26 | Discharge: 2016-06-26 | Disposition: A | Discharge status: Home / Self Care | Payer: Self-pay | Attending: Emergency Medicine | Admitting: Emergency Medicine

## 2016-06-26 DIAGNOSIS — G8929 Other chronic pain: Secondary | ICD-10-CM | POA: Insufficient documentation

## 2016-06-26 DIAGNOSIS — M545 Low back pain: Secondary | ICD-10-CM | POA: Insufficient documentation

## 2016-06-26 DIAGNOSIS — T464X5A Adverse effect of angiotensin-converting-enzyme inhibitors, initial encounter: Secondary | ICD-10-CM

## 2016-06-26 DIAGNOSIS — Z79899 Other long term (current) drug therapy: Secondary | ICD-10-CM | POA: Insufficient documentation

## 2016-06-26 DIAGNOSIS — R05 Cough: Secondary | ICD-10-CM | POA: Insufficient documentation

## 2016-06-26 DIAGNOSIS — E119 Type 2 diabetes mellitus without complications: Secondary | ICD-10-CM | POA: Insufficient documentation

## 2016-06-26 DIAGNOSIS — Z7984 Long term (current) use of oral hypoglycemic drugs: Secondary | ICD-10-CM | POA: Insufficient documentation

## 2016-06-26 DIAGNOSIS — Z791 Long term (current) use of non-steroidal anti-inflammatories (NSAID): Secondary | ICD-10-CM | POA: Insufficient documentation

## 2016-06-26 LAB — BASIC METABOLIC PANEL
ANION GAP: 6 (ref 5–15)
BUN: 26 mg/dL — ABNORMAL HIGH (ref 6–20)
CHLORIDE: 100 mmol/L — AB (ref 101–111)
CO2: 30 mmol/L (ref 22–32)
CREATININE: 0.92 mg/dL (ref 0.61–1.24)
Calcium: 9.3 mg/dL (ref 8.9–10.3)
GFR calc non Af Amer: >60 mL/min (ref >60)
Glucose, Bld: 294 mg/dL — ABNORMAL HIGH (ref 65–99)
POTASSIUM: 3.9 mmol/L (ref 3.5–5.1)
SODIUM: 136 mmol/L (ref 135–145)

## 2016-06-26 LAB — CBC
HCT: 39.3 % — ABNORMAL LOW (ref 40.0–52.0)
HEMOGLOBIN: 13.7 g/dL (ref 13.0–18.0)
MCH: 28.9 pg (ref 26.0–34.0)
MCHC: 34.9 g/dL (ref 32.0–36.0)
MCV: 82.8 fL (ref 80.0–100.0)
PLATELETS: 178 10*3/uL (ref 150–440)
RBC: 4.74 MIL/uL (ref 4.40–5.90)
RDW: 13.8 % (ref 11.5–14.5)
WBC: 6.7 10*3/uL (ref 3.8–10.6)

## 2016-06-26 LAB — TROPONIN I: Troponin I: <0.03 ng/mL (ref <0.03)

## 2016-06-26 MED ORDER — LOSARTAN POTASSIUM-HCTZ 50-12.5 MG PO TABS: 1.0000 | ORAL_TABLET | Freq: Every day | ORAL | 0 refills | Status: DC

## 2016-06-26 MED ORDER — HYDROCOD POLST-CPM POLST ER 10-8 MG/5ML PO SUER
ORAL | Status: AC
  Administered 2016-06-26: 5 mL via ORAL
  Filled 2016-06-26: qty 5

## 2016-06-26 MED ORDER — HYDROCOD POLST-CPM POLST ER 10-8 MG/5ML PO SUER
5.0000 mL | Freq: Once | ORAL | Status: AC
  Administered 2016-06-26: 5 mL via ORAL

## 2016-06-26 NOTE — ED Triage Notes (Addendum)
Pt reports he has a cough for 4 weeks.  Nonsmoker. Pt reports chest pain at times.  Pt reports it hurts to take a deep breath.  Pt also reports low back pain.  Hx back problems.  Denies urinary sx.  Pt alert.
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
## 2017-01-01 ENCOUNTER — Encounter: Payer: Self-pay | Admitting: Family Medicine

## 2017-01-01 ENCOUNTER — Ambulatory Visit (INDEPENDENT_AMBULATORY_CARE_PROVIDER_SITE_OTHER): Payer: Medicaid Other | Admitting: Family Medicine

## 2017-01-01 VITALS — BP 139/75 | HR 83 | Temp 98.0°F | Resp 17 | Ht 68.0 in | Wt 162.3 lb

## 2017-01-01 DIAGNOSIS — IMO0001 Reserved for inherently not codable concepts without codable children: Secondary | ICD-10-CM

## 2017-01-01 DIAGNOSIS — L739 Follicular disorder, unspecified: Secondary | ICD-10-CM | POA: Diagnosis not present

## 2017-01-01 DIAGNOSIS — E1165 Type 2 diabetes mellitus with hyperglycemia: Secondary | ICD-10-CM | POA: Diagnosis not present

## 2017-01-01 DIAGNOSIS — E781 Pure hyperglyceridemia: Secondary | ICD-10-CM

## 2017-01-01 LAB — GLUCOSE, POCT (MANUAL RESULT ENTRY): POC Glucose: 175 mg/dl — AB (ref 70–99)

## 2017-01-01 LAB — POCT GLYCOSYLATED HEMOGLOBIN (HGB A1C): Hemoglobin A1C: 8.6

## 2017-01-01 MED ORDER — GLIMEPIRIDE 4 MG PO TABS: 4.0000 mg | ORAL_TABLET | Freq: Two times a day (BID) | ORAL | 0 refills | Status: DC

## 2017-01-01 MED ORDER — MUPIROCIN 2 % EX OINT: 1.0000 "application " | TOPICAL_OINTMENT | Freq: Two times a day (BID) | CUTANEOUS | 0 refills | Status: DC

## 2017-01-01 MED ORDER — DOXYCYCLINE HYCLATE 100 MG PO TABS: 100.0000 mg | ORAL_TABLET | Freq: Two times a day (BID) | ORAL | 0 refills | Status: AC

## 2017-01-01 NOTE — Progress Notes (Signed)
Name: Daniel Powell   MRN: [id number]    DOB: Mar 20, 1956   Date:01/01/2017       Progress Note  Subjective  Chief Complaint  Chief Complaint  Patient presents with  . Follow-up    bump on back of head x2 weeks    Diabetes  He presents for his follow-up diabetic visit. He has type 2 diabetes mellitus. His disease course has been worsening. Pertinent negatives for diabetic complications include no CVA or heart disease. Current diabetic treatment includes oral agent (dual therapy). An ACE inhibitor/angiotensin II receptor blocker is being taken.  Hyperlipidemia  This is a chronic problem. The problem is uncontrolled. Recent lipid tests were reviewed and are high. Pertinent negatives include no leg pain, myalgias or shortness of breath. Current antihyperlipidemic treatment includes statins.    Pt. Presents to evaluate 'warts' on his head, noticed 2 weeks ago, these are described as itchy, scratchy, some have scant bleeding, initial lesion resolved and now more have appeared. He has not applied any cream or treatment. He had similar lesions on his knees and neck before.   Past Medical History:  Diagnosis Date  . Diabetes mellitus without complication ([place])     History reviewed. No pertinent surgical history.  History reviewed. No pertinent family history.  Social History   Social History  . Marital status: Legally Separated    Spouse name: N/A  . Number of children: N/A  . Years of education: N/A   Occupational History  . Not on file.   Social History Main Topics  . Smoking status: Never Smoker  . Smokeless tobacco: Never Used  . Alcohol use No  . Drug use: No  . Sexual activity: Not on file   Other Topics Concern  . Not on file   Social History Narrative  . No narrative on file     Current Outpatient Prescriptions:  .  glimepiride (AMARYL) 4 MG tablet, Take 1 tablet (4 mg total) by mouth daily with breakfast., Disp: 90 tablet, Rfl: 0 .  linagliptin (TRADJENTA) 5 MG  TABS tablet, Take 1 tablet (5 mg total) by mouth daily., Disp: 90 tablet, Rfl: 0 .  losartan-hydrochlorothiazide (HYZAAR) 50-12.5 MG tablet, Take 1 tablet by mouth daily., Disp: 30 tablet, Rfl: 0 .  metFORMIN (GLUCOPHAGE) 1000 MG tablet, Take 1 tablet (1,000 mg total) by mouth 2 (two) times daily with a meal., Disp: 180 tablet, Rfl: 1 .  naproxen (NAPROSYN) 500 MG tablet, , Disp: , Rfl:  .  simvastatin (ZOCOR) 40 MG tablet, Take 1 tablet (40 mg total) by mouth daily at 6 PM., Disp: 90 tablet, Rfl: 0 .  albuterol (PROVENTIL HFA;VENTOLIN HFA) 108 (90 Base) MCG/ACT inhaler, Inhale 2 puffs into the lungs every 6 (six) hours as needed. (Patient not taking: Reported on 01/01/2017), Disp: 1 Inhaler, Rfl: 0 .  benzonatate (TESSALON PERLES) 100 MG capsule, Take 1 capsule (100 mg total) by mouth every 6 (six) hours as needed for cough. (Patient not taking: Reported on 01/01/2017), Disp: 15 capsule, Rfl: 0 .  cephALEXin (KEFLEX) 500 MG capsule, Take 1 capsule (500 mg total) by mouth 3 (three) times daily. (Patient not taking: Reported on 01/01/2017), Disp: 30 capsule, Rfl: 0 .  predniSONE (DELTASONE) 20 MG tablet, Take 3 tablets (60 mg total) by mouth daily. (Patient not taking: Reported on 01/01/2017), Disp: 12 tablet, Rfl: 0  Allergies  Allergen Reactions  . Gadolinium Derivatives Itching    Pt began itching across ribs and back and neck s/p  Multihance injection     Review of Systems  Respiratory: Negative for shortness of breath.   Musculoskeletal: Negative for myalgias.     Objective  Vitals:   01/01/17 1355  BP: 139/75  Pulse: 83  Resp: 17  Temp: 98 F (36.7 C)  TempSrc: Oral  SpO2: 96%  Weight: 162 lb 4.8 oz (73.6 kg)  Height: 5\' 8"  (1.727 m)    Physical Exam  Constitutional: He is oriented to person, place, and time and well-developed, well-nourished, and in no distress.  HENT:  Head:    Multiple papulo-pustular erythematous pruritic lesions on the scalp, some with dried blood  visible,   Cardiovascular: Normal rate, regular rhythm and normal heart sounds.   No murmur heard. Pulmonary/Chest: Effort normal and breath sounds normal. He has no wheezes.  Neurological: He is alert and oriented to person, place, and time.  Psychiatric: Mood, memory, affect and judgment normal.  Nursing note and vitals reviewed.    Recent Results (from the past 2160 hour(s))  POCT Glucose (CBG)     Status: Abnormal   Collection Time: 01/01/17  2:03 PM  Result Value Ref Range   POC Glucose 175 (A) 70 - 99 mg/dl  POCT HgB A1C     Status: Abnormal   Collection Time: 01/01/17  2:04 PM  Result Value Ref Range   Hemoglobin A1C 8.6      Assessment & Plan  1. Folliculitis By history and exam, will start on doxycycline for treatment, may apply mupirocin ointment for topical relief - doxycycline (VIBRA-TABS) 100 MG tablet; Take 1 tablet (100 mg total) by mouth 2 (two) times daily.  Dispense: 14 tablet; Refill: 0 - mupirocin ointment (BACTROBAN) 2 %; Apply 1 application topically 2 (two) times daily.  Dispense: 22 g; Refill: 0  2. Uncontrolled type 2 diabetes mellitus without complication, without long-term current use of insulin ([facility]) A1c is 8.6%, patient has been seen at [facility] clinic, records are not available. We'll increase glimepiride to 4 mg twice a day, continue metformin and Levaquin and glipizide and unchanged, reassess in 3 months - POCT HgB A1C - POCT Glucose (CBG) - glimepiride (AMARYL) 4 MG tablet; Take 1 tablet (4 mg total) by mouth 2 (two) times daily after a meal.  Dispense: 180 tablet; Refill: 0 - Urine Microalbumin w/creat. ratio  3. Hypertriglyceridemia On statin, obtain FLP and adjust as appropriate - Lipid panel - COMPLETE METABOLIC PANEL WITH GFR  Note: Total face-to-face time 40 minutes, greater than 50% was spent in counseling and coordination of care with the patient. This included interpretation services as patient does not speak fluent English and Arabic  is his first language, interpreter ID [id number], management of diabetes and high cholesterol along with treatment of folliculitis. Kumari Sculley [facility] 01/01/2017 2:14 PM

## 2017-01-02 LAB — COMPLETE METABOLIC PANEL WITH GFR
ALBUMIN: 4.5 g/dL (ref 3.6–5.1)
ALK PHOS: 57 U/L (ref 40–115)
ALT: 15 U/L (ref 9–46)
AST: 15 U/L (ref 10–35)
BILIRUBIN TOTAL: 0.9 mg/dL (ref 0.2–1.2)
BUN: 18 mg/dL (ref 7–25)
CALCIUM: 9.3 mg/dL (ref 8.6–10.3)
CO2: 27 mmol/L (ref 20–31)
CREATININE: 0.86 mg/dL (ref 0.70–1.33)
Chloride: 98 mmol/L (ref 98–110)
GFR, Est African American: >89 mL/min (ref >=60)
GFR, Est Non African American: >89 mL/min (ref >=60)
GLUCOSE: 160 mg/dL — AB (ref 65–99)
POTASSIUM: 3.9 mmol/L (ref 3.5–5.3)
SODIUM: 137 mmol/L (ref 135–146)
TOTAL PROTEIN: 7.2 g/dL (ref 6.1–8.1)

## 2017-01-02 LAB — MICROALBUMIN / CREATININE URINE RATIO
Creatinine, Urine: 188 mg/dL (ref 20–370)
MICROALB UR: 47.9 mg/dL
MICROALB/CREAT RATIO: 255 ug/mg{creat} — AB (ref <30)

## 2017-01-02 LAB — LIPID PANEL
CHOL/HDL RATIO: 4.4 ratio (ref <5.0)
CHOLESTEROL: 145 mg/dL (ref <200)
HDL: 33 mg/dL — ABNORMAL LOW (ref >40)
LDL Cholesterol: 64 mg/dL (ref <100)
Triglycerides: 239 mg/dL — ABNORMAL HIGH (ref <150)
VLDL: 48 mg/dL — ABNORMAL HIGH (ref <30)

## 2017-01-15 ENCOUNTER — Ambulatory Visit (INDEPENDENT_AMBULATORY_CARE_PROVIDER_SITE_OTHER): Payer: Medicaid Other | Admitting: Family Medicine

## 2017-01-21 ENCOUNTER — Ambulatory Visit (INDEPENDENT_AMBULATORY_CARE_PROVIDER_SITE_OTHER): Payer: Medicaid Other | Admitting: Family Medicine

## 2017-01-21 ENCOUNTER — Encounter: Payer: Self-pay | Admitting: Family Medicine

## 2017-01-21 VITALS — BP 136/75 | HR 104 | Temp 97.6°F | Resp 17 | Ht 68.0 in | Wt 164.0 lb

## 2017-01-21 DIAGNOSIS — Z1331 Encounter for screening for depression: Secondary | ICD-10-CM

## 2017-01-21 DIAGNOSIS — Z1389 Encounter for screening for other disorder: Secondary | ICD-10-CM

## 2017-01-21 DIAGNOSIS — R413 Other amnesia: Secondary | ICD-10-CM | POA: Diagnosis not present

## 2017-01-21 LAB — TSH: TSH: 1.15 m[IU]/L (ref 0.40–4.50)

## 2017-01-21 NOTE — Progress Notes (Signed)
Name: Daniel Powell   MRN: [id number]    DOB: 15-Jun-1956   Date:01/21/2017       Progress Note  Subjective  Chief Complaint  Chief Complaint  Patient presents with  . Follow-up    Memory    HPI  Pt. Presents with concerns about memory impairment.  The main concern here is that he forgets where he put an item afterwards putting it there, also concerned about driving where he intends to drive to one place but ends up at another. He also has some difficulty with remembering names of family members who are young but not old.  He lives with his son and brother, he does not work.  He is otherwise able to take care of himself with bathing dressing and cooking etc.     Past Medical History:  Diagnosis Date  . Diabetes mellitus without complication ([place])     History reviewed. No pertinent surgical history.  History reviewed. No pertinent family history.  Social History   Social History  . Marital status: Legally Separated    Spouse name: N/A  . Number of children: N/A  . Years of education: N/A   Occupational History  . Not on file.   Social History Main Topics  . Smoking status: Never Smoker  . Smokeless tobacco: Never Used  . Alcohol use No  . Drug use: No  . Sexual activity: Not on file   Other Topics Concern  . Not on file   Social History Narrative  . No narrative on file     Current Outpatient Prescriptions:  .  albuterol (PROVENTIL HFA;VENTOLIN HFA) 108 (90 Base) MCG/ACT inhaler, Inhale 2 puffs into the lungs every 6 (six) hours as needed., Disp: 1 Inhaler, Rfl: 0 .  benzonatate (TESSALON PERLES) 100 MG capsule, Take 1 capsule (100 mg total) by mouth every 6 (six) hours as needed for cough., Disp: 15 capsule, Rfl: 0 .  cephALEXin (KEFLEX) 500 MG capsule, Take 1 capsule (500 mg total) by mouth 3 (three) times daily., Disp: 30 capsule, Rfl: 0 .  gabapentin (NEURONTIN) 300 MG capsule, Take 300 mg by mouth at bedtime., Disp: , Rfl:  .  glimepiride (AMARYL) 4 MG  tablet, Take 1 tablet (4 mg total) by mouth daily., Disp: 90 tablet, Rfl: 0 .  glimepiride (AMARYL) 4 MG tablet, Take 1 tablet (4 mg total) by mouth 2 (two) times daily after a meal., Disp: 180 tablet, Rfl: 0 .  linagliptin (TRADJENTA) 5 MG TABS tablet, Take 1 tablet (5 mg total) by mouth daily., Disp: 90 tablet, Rfl: 0 .  losartan-hydrochlorothiazide (HYZAAR) 50-12.5 MG tablet, Take 1 tablet by mouth daily., Disp: 30 tablet, Rfl: 0 .  metFORMIN (GLUCOPHAGE) 1000 MG tablet, Take 1 tablet (1,000 mg total) by mouth 2 (two) times daily with a meal., Disp: 180 tablet, Rfl: 1 .  metFORMIN (GLUCOPHAGE) 1000 MG tablet, Take 1 tablet (1,000 mg total) by mouth 2 (two) times daily with a meal., Disp: 180 tablet, Rfl: 0 .  mupirocin ointment (BACTROBAN) 2 %, Apply 1 application topically 2 (two) times daily., Disp: 22 g, Rfl: 0 .  naproxen (NAPROSYN) 500 MG tablet, , Disp: , Rfl:  .  predniSONE (DELTASONE) 20 MG tablet, Take 3 tablets (60 mg total) by mouth daily., Disp: 12 tablet, Rfl: 0 .  simvastatin (ZOCOR) 40 MG tablet, Take 1 tablet (40 mg total) by mouth daily at 6 PM., Disp: 90 tablet, Rfl: 0 .  simvastatin (ZOCOR) 40 MG tablet, Take  1 tablet (40 mg total) by mouth at bedtime., Disp: 30 tablet, Rfl: 0 .  tamsulosin (FLOMAX) 0.4 MG CAPS capsule, Take 0.4 mg by mouth at bedtime., Disp: , Rfl:   Allergies  Allergen Reactions  . Gadolinium Derivatives Itching    Pt began itching across ribs and back and neck s/p Multihance injection     ROS  Please see history of present illness for complete description of ROS  Objective  Vitals:   01/21/17 1445  BP: 136/75  Pulse: (!) 104  Resp: 17  Temp: 97.6 F (36.4 C)  TempSrc: Oral  SpO2: 96%  Weight: 164 lb (74.4 kg)  Height: 5\' 8"  (1.727 m)    Physical Exam  Constitutional: He is well-developed, well-nourished, and in no distress.  Neurological: He is alert.  Psychiatric: Mood, memory, affect and judgment normal.  Nursing note and vitals  reviewed.    Assessment & Plan  1. Memory impairment 6 CIT was unable to be completed because patient could not remember the address trace as part of testing, will be referred to neurology for further management, obtain TSH and B12 - Ambulatory referral to Neurology - TSH - B12  2. Positive depression screening Positive PHQ- 2, likely consistent with depression, referred to psychiatry - Ambulatory referral to Psychiatry  Note: Total face-to-face time: 40 minutes, greater than 50% was spent in counseling and coronation of care the patient, this included history taking, administering 6 CIT, interpretation services and follow-up counseling. Interpreter name: Phillips Odor [facility] 01/21/2017 3:50 PM

## 2017-01-22 LAB — VITAMIN B12: VITAMIN B 12: 268 pg/mL (ref 200–1100)

## 2017-03-17 ENCOUNTER — Encounter: Payer: Self-pay | Admitting: Diagnostic Neuroimaging

## 2017-03-17 ENCOUNTER — Ambulatory Visit (INDEPENDENT_AMBULATORY_CARE_PROVIDER_SITE_OTHER): Payer: Medicaid Other | Admitting: Diagnostic Neuroimaging

## 2017-03-17 VITALS — BP 143/75 | HR 74 | Ht 68.0 in | Wt 162.2 lb

## 2017-03-17 DIAGNOSIS — R413 Other amnesia: Secondary | ICD-10-CM | POA: Diagnosis not present

## 2017-03-17 DIAGNOSIS — F4310 Post-traumatic stress disorder, unspecified: Secondary | ICD-10-CM | POA: Diagnosis not present

## 2017-03-17 DIAGNOSIS — F331 Major depressive disorder, recurrent, moderate: Secondary | ICD-10-CM | POA: Diagnosis not present

## 2017-03-17 NOTE — Progress Notes (Signed)
[facility]  PATIENT: Daniel Powell DOB: 06-09-1956  REFERRING CLINICIAN: Trena Platt, MD HISTORY FROM: patient and son and chart review (via son and also interpreter) REASON FOR VISIT: new consult    HISTORICAL  CHIEF COMPLAINT:  Chief Complaint  Patient presents with  . NP Manuella Ghazi  . Memory Loss    With son and interpreter (arabic).      HISTORY OF PRESENT ILLNESS:   61 year old male here for evaluation of memory loss.  Patient was born in [place], but moved to [place] at a young age. His entire family lives there. He had normal birth and development, education and up ringing. 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 Apparently he was dealing with some people who were demanding money and repayment of loans, ultimately leading to a threat to his life and his family's lives. He moved to the [place] to live with his son approximately 7 years ago. He was in fairly good health for the first 1-2 years. However in the last 5 years she has had progressive decline in cognitive ability, memory, motivation, mood, activity. He has some significant depression symptoms. Patient's son feels that his depression and mood are affecting his cognitive ability. Now patient having more problems the last 1-2 years. Patient's son not able to leave them alone. Patient is not taking his medications properly.    REVIEW OF SYSTEMS: Full 14 system review of systems performed and negative with exception of: Weight loss loss of vision eye pain memory loss dizziness not asleep restless leg sleepiness skin sensitivity.  ALLERGIES: Allergies  Allergen Reactions  . Gadolinium Derivatives Itching    Pt began itching across ribs and back and neck s/p Multihance injection    HOME MEDICATIONS: Outpatient Medications Prior to Visit  Medication Sig Dispense Refill  . albuterol  (PROVENTIL HFA;VENTOLIN HFA) 108 (90 Base) MCG/ACT inhaler Inhale 2 puffs into the lungs every 6 (six) hours as needed. 1 Inhaler 0  . benzonatate (TESSALON PERLES) 100 MG capsule Take 1 capsule (100 mg total) by mouth every 6 (six) hours as needed for cough. 15 capsule 0  . cephALEXin (KEFLEX) 500 MG capsule Take 1 capsule (500 mg total) by mouth 3 (three) times daily. 30 capsule 0  . gabapentin (NEURONTIN) 300 MG capsule Take 300 mg by mouth at bedtime.    Marland Kitchen glimepiride (AMARYL) 4 MG tablet Take 1 tablet (4 mg total) by mouth daily. 90 tablet 0  . glimepiride (AMARYL) 4 MG tablet Take 1 tablet (4 mg total) by mouth 2 (two) times daily after a meal. 180 tablet 0  . linagliptin (TRADJENTA) 5 MG TABS tablet Take 1 tablet (5 mg total) by mouth daily. 90 tablet 0  . losartan-hydrochlorothiazide (HYZAAR) 50-12.5 MG tablet Take 1 tablet by mouth daily. 30 tablet 0  . metFORMIN (GLUCOPHAGE) 1000 MG tablet Take 1 tablet (1,000 mg total) by mouth 2 (two) times daily with a meal. 180 tablet 1  . metFORMIN (GLUCOPHAGE) 1000 MG tablet Take 1 tablet (1,000 mg total) by mouth 2 (two) times daily with a meal. 180 tablet 0  . mupirocin ointment (BACTROBAN) 2 % Apply 1 application topically 2 (two) times daily. 22 g 0  . naproxen (NAPROSYN) 500 MG tablet     . predniSONE (DELTASONE) 20 MG tablet Take 3 tablets (60 mg total) by mouth daily. 12 tablet  0  . simvastatin (ZOCOR) 40 MG tablet Take 1 tablet (40 mg total) by mouth daily at 6 PM. 90 tablet 0  . simvastatin (ZOCOR) 40 MG tablet Take 1 tablet (40 mg total) by mouth at bedtime. 30 tablet 0  . tamsulosin (FLOMAX) 0.4 MG CAPS capsule Take 0.4 mg by mouth at bedtime.     No facility-administered medications prior to visit.     PAST MEDICAL HISTORY: Past Medical History:  Diagnosis Date  . Diabetes mellitus without complication ([place])     PAST SURGICAL HISTORY: Past Surgical History:  Procedure Laterality Date  . BACK SURGERY  2014    FAMILY  HISTORY: Family History  Problem Relation Age of Onset  . Heart disease Father     SOCIAL HISTORY:  Social History   Social History  . Marital status: Legally Separated    Spouse name: N/A  . Number of children: N/A  . Years of education: N/A   Occupational History  . Not on file.   Social History Main Topics  . Smoking status: Never Smoker  . Smokeless tobacco: Never Used  . Alcohol use No  . Drug use: No  . Sexual activity: Not on file   Other Topics Concern  . Not on file   Social History Narrative   Lives at home with son.  Does not work.  Has significant other.       PHYSICAL EXAM  GENERAL EXAM/CONSTITUTIONAL: Vitals:  Vitals:   03/17/17 1040  BP: (!) 143/75  Pulse: 74  Weight: 162 lb 3.2 oz (73.6 kg)  Height: 5\' 8"  (1.727 m)     Body mass index is 24.66 kg/m.  No exam data present  Patient is in no distress; well developed, nourished and groomed; neck is supple  CARDIOVASCULAR:  Examination of carotid arteries is normal; no carotid bruits  Regular rate and rhythm, no murmurs  Examination of peripheral vascular system by observation and palpation is normal  EYES:  Ophthalmoscopic exam of optic discs and posterior segments is normal; no papilledema or hemorrhages  MUSCULOSKELETAL:  Gait, strength, tone, movements noted in Neurologic exam below  NEUROLOGIC: MENTAL STATUS:  MMSE - Mini Mental State Exam 03/17/2017  Orientation to time 0  Orientation to Place 0  Registration 3  Attention/ Calculation 0  Recall 1  Language- name 2 objects 2  Language- repeat 0  Language- follow 3 step command 3  Language- read & follow direction 0  Language-read & follow direction-comments can read english  Write a sentence 0  Copy design 0  Total score 9    LIMITED INTERACTION AND RESPONSES; SAYS "I DONT KNOW" TO MOST OF THE QUESTIONS  awake, alert, oriented to person only  DECR recent and remote memory intact; DOES NOT [place] attention and concentration  DECR FLUENCY, DECR COMPREHENSION, naming intact  DECR fund of knowledge  CRANIAL NERVE:   2nd - no papilledema on fundoscopic exam  2nd, 3rd, 4th, 6th - pupils equal and reactive to light, visual fields full to confrontation, extraocular muscles intact, no nystagmus  5th - facial sensation symmetric  7th - facial strength symmetric  8th - hearing intact  9th - palate elevates symmetrically, uvula midline  11th - shoulder shrug symmetric  12th - tongue protrusion midline  MOTOR:   normal bulk and tone, full strength in the BUE, BLE  LIMITED IN BLE DUE TO LOW BACK PAIN  SENSORY:   normal and symmetric to  light touch, temperature, vibration  COORDINATION:   finger-nose-finger, fine finger movements normal  REFLEXES:   deep tendon reflexes TRACE and symmetric  GAIT/STATION:   narrow based gait; ANTALGIC GAIT    DIAGNOSTIC DATA (LABS, IMAGING, TESTING) - I reviewed patient records, labs, notes, testing and imaging myself where available.  Lab Results  Component Value Date   WBC 6.7 06/26/2016   HGB 13.7 06/26/2016   HCT 39.3 (L) 06/26/2016   MCV 82.8 06/26/2016   PLT 178 06/26/2016      Component Value Date/Time   NA 137 01/01/2017 1447   NA 143 03/02/2015 1220   NA 139 07/08/2014 1348   K 3.9 01/01/2017 1447   K 4.1 07/08/2014 1348   CL 98 01/01/2017 1447   CL 102 07/08/2014 1348   CO2 27 01/01/2017 1447   CO2 28 07/08/2014 1348   GLUCOSE 160 (H) 01/01/2017 1447   GLUCOSE 252 (H) 07/08/2014 1348   BUN 18 01/01/2017 1447   BUN 19 03/02/2015 1220   BUN 26 (H) 07/08/2014 1348   CREATININE 0.86 01/01/2017 1447   CALCIUM 9.3 01/01/2017 1447   CALCIUM 9.2 07/08/2014 1348   PROT 7.2 01/01/2017 1447   PROT 7.2 03/02/2015 1220   PROT 7.9 07/08/2014 1348   ALBUMIN 4.5 01/01/2017 1447   ALBUMIN 4.3 03/02/2015 1220   ALBUMIN 3.9 07/08/2014 1348   AST 15 01/01/2017 1447   AST 13 (L) 07/08/2014 1348   ALT 15  01/01/2017 1447   ALT 17 07/08/2014 1348   ALKPHOS 57 01/01/2017 1447   ALKPHOS 68 07/08/2014 1348   BILITOT 0.9 01/01/2017 1447   BILITOT 0.5 03/02/2015 1220   BILITOT 1.1 (H) 07/08/2014 1348   GFRNONAA >89 01/01/2017 1447   GFRAA >89 01/01/2017 1447   Lab Results  Component Value Date   CHOL 145 01/01/2017   HDL 33 (L) 01/01/2017   LDLCALC 64 01/01/2017   TRIG 239 (H) 01/01/2017   CHOLHDL 4.4 01/01/2017   Lab Results  Component Value Date   HGBA1C 8.6 01/01/2017   Lab Results  Component Value Date   VITAMINB12 268 01/21/2017   Lab Results  Component Value Date   TSH 1.15 01/21/2017     10/27/16 MRI BRAIN  - Normal appearance of the brain itself. - Sinus inflammatory disease diffusely.    ASSESSMENT AND PLAN  62 y.o. year old male here with Significant psychosocial stress, having to flee his business, family and country in 2011, now with progressive short-term memory loss and confusion. 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 Will proceed with MRI of the brain to rule out other secondary causes. Recommend follow-up with psychiatry, psychology for evaluation and treatment.   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 Moderate episode of recurrent major depressive disorder ([place])   2. PTSD (post-traumatic stress disorder)   3. Memory loss      PLAN:  - check MRI brain to rule out stroke, mass or other cause of memory loss - agree with referral to psychiatry / psychology for depression / PTSD - brain healthy activities reviewed  Orders Placed This Encounter  Procedures  . MR BRAIN WO CONTRAST   Return in about 6 months (around 09/14/2017).    Penni Bombard, MD 0/27/2536, 64:40 AM Certified in Neurology, Neurophysiology and Neuroimaging  [facility] [address], [place], [place] [address] [phone]

## 2017-03-17 NOTE — Patient Instructions (Signed)
Thank you for coming to see [place] at [facility]. I hope we have been able to provide you high quality care today.  You may receive a patient satisfaction survey over the next few weeks. We would appreciate your feedback and comments so that we may continue to improve ourselves and the health of our patients.  - check MRI brain scan  - stay active (physically and mentally)   ~~~~~~~~~~~~~~~~~~~~~~~~~~~~~~~~~~~~~~~~~~~~~~~~~~~~~~~~~~~~~~~~~  DR. Dela Sweeny'S GUIDE TO HAPPY AND HEALTHY LIVING These are some of my general health and wellness recommendations. Some of them may apply to you better than others. Please use common sense as you try these suggestions and feel free to ask me any questions.   ACTIVITY/FITNESS Mental, social, emotional and physical stimulation are very important for brain and body health. Try learning a new activity (arts, music, language, sports, games).  Keep moving your body to the best of your abilities. You can do this at home, inside or outside, the park, community center, gym or anywhere you like. Consider a physical therapist or personal trainer to get started. Consider the app Sworkit. Fitness trackers such as smart-watches, smart-phones or Fitbits can help as well.   NUTRITION Eat more plants: colorful vegetables, nuts, seeds and berries.  Eat less sugar, salt, preservatives and processed foods.  Avoid toxins such as cigarettes and alcohol.  Drink water when you are thirsty. Warm water with a slice of lemon is an excellent morning drink to start the day.  Consider these websites for more information The Nutrition Source (https://www.henry-hernandez.biz/) Precision Nutrition (WindowBlog.ch)   RELAXATION Consider practicing mindfulness meditation or other relaxation techniques such as deep breathing, prayer, yoga, tai chi, massage. See website mindful.org or the apps Headspace or Calm to help get  started.   SLEEP Try to get at least 7-8+ hours sleep per day. Regular exercise and reduced caffeine will help you sleep better. Practice good sleep hygeine techniques. See website sleep.org for more information.   PLANNING Prepare estate planning, living will, healthcare POA documents. Sometimes this is best planned with the help of an attorney. Theconversationproject.org and agingwithdignity.org are excellent resources.

## 2017-03-22 ENCOUNTER — Ambulatory Visit
Admission: RE | Admit: 2017-03-22 | Discharge: 2017-03-22 | Disposition: A | Discharge status: Home / Self Care | Payer: Medicaid Other | Source: Ambulatory Visit | Attending: Diagnostic Neuroimaging | Admitting: Diagnostic Neuroimaging

## 2017-03-22 DIAGNOSIS — R413 Other amnesia: Secondary | ICD-10-CM | POA: Diagnosis present

## 2017-03-24 ENCOUNTER — Telehealth: Payer: Self-pay | Admitting: Diagnostic Neuroimaging

## 2017-03-24 NOTE — Telephone Encounter (Signed)
Pt called in said he was seen at [facility] hospital and wants to make an appt with Dr Leta Baptist. I had a difficult time understanding him as there is a language barrier. Please call

## 2017-03-24 NOTE — Telephone Encounter (Signed)
Pt's son called back again. Pt said he needs some paperwork filled out. He said they are wanting to take the pt back home to the [place] but his passport has expired. The son has decided he will drop off this paperwork today, it be addressed to [employer].

## 2017-03-25 NOTE — Telephone Encounter (Signed)
Attempted to reach patient's son to discuss papers received. Person took office number , stated he would have son call back.

## 2017-03-26 NOTE — Telephone Encounter (Signed)
Received call back from patient's son, Melrose Nakayama on [place]. This RN informed him of patient's MRI brain result of unremarkable imaging. Then advised him that Dr Leta Baptist reviewed the [employer] he brought in. Advised son that Dr Wynonia Hazard stated he cannot complete the papers, has only seen patient once. Advised PCP, Dr Manuella Ghazi or psychiatrist needs to complete papers. Son expressed concern, stated he is trying to get a passport to get his father back home to his family. This RN advised he contact PCP to discuss and this RN would discuss once more with Dr Leta Baptist. Melrose Nakayama agreed to contact Dr Manuella Ghazi, verbalized understanding.

## 2017-03-31 ENCOUNTER — Telehealth: Payer: Self-pay | Admitting: Family Medicine

## 2017-03-31 NOTE — Telephone Encounter (Signed)
Pts son called stating he would like a call back to be advised about his dad.

## 2017-04-01 NOTE — Telephone Encounter (Signed)
Called pt's soon back, he states that they have an appt set up for tomorrow. Will address concerns at that time.

## 2017-04-02 ENCOUNTER — Ambulatory Visit
Admission: RE | Admit: 2017-04-02 | Discharge: 2017-04-02 | Disposition: A | Discharge status: Home / Self Care | Payer: Medicaid Other | Source: Ambulatory Visit | Attending: Family Medicine | Admitting: Family Medicine

## 2017-04-02 ENCOUNTER — Encounter: Payer: Self-pay | Admitting: Family Medicine

## 2017-04-02 ENCOUNTER — Ambulatory Visit (INDEPENDENT_AMBULATORY_CARE_PROVIDER_SITE_OTHER): Payer: Medicaid Other | Admitting: Family Medicine

## 2017-04-02 VITALS — BP 140/72 | HR 100 | Temp 98.1°F | Resp 16 | Ht 68.0 in | Wt 159.3 lb

## 2017-04-02 DIAGNOSIS — R05 Cough: Secondary | ICD-10-CM | POA: Insufficient documentation

## 2017-04-02 DIAGNOSIS — F331 Major depressive disorder, recurrent, moderate: Secondary | ICD-10-CM

## 2017-04-02 DIAGNOSIS — R058 Other specified cough: Secondary | ICD-10-CM

## 2017-04-02 DIAGNOSIS — I10 Essential (primary) hypertension: Secondary | ICD-10-CM | POA: Diagnosis not present

## 2017-04-02 LAB — COMPLETE METABOLIC PANEL WITH GFR
AG Ratio: 1.5 (calc) (ref 1.0–2.5)
ALT: 17 U/L (ref 9–46)
AST: 17 U/L (ref 10–35)
Albumin: 4.5 g/dL (ref 3.6–5.1)
Alkaline phosphatase (APISO): 64 U/L (ref 40–115)
BUN: 20 mg/dL (ref 7–25)
CALCIUM: 9.6 mg/dL (ref 8.6–10.3)
CO2: 28 mmol/L (ref 20–32)
CREATININE: 1.11 mg/dL (ref 0.70–1.33)
Chloride: 96 mmol/L — ABNORMAL LOW (ref 98–110)
GFR, EST AFRICAN AMERICAN: 84 mL/min/{1.73_m2} (ref >=60)
GFR, EST NON AFRICAN AMERICAN: 72 mL/min/{1.73_m2} (ref >=60)
GLUCOSE: 243 mg/dL — AB (ref 65–139)
Globulin: 3.1 g/dL (calc) (ref 1.9–3.7)
Potassium: 4.1 mmol/L (ref 3.5–5.3)
Sodium: 136 mmol/L (ref 135–146)
TOTAL PROTEIN: 7.6 g/dL (ref 6.1–8.1)
Total Bilirubin: 1.1 mg/dL (ref 0.2–1.2)

## 2017-04-02 LAB — CBC WITH DIFFERENTIAL/PLATELET
BASOS PCT: 0.6 %
Basophils Absolute: 41 cells/uL (ref 0–200)
Eosinophils Absolute: 211 cells/uL (ref 15–500)
Eosinophils Relative: 3.1 %
HCT: 41.4 % (ref 38.5–50.0)
Hemoglobin: 14.2 g/dL (ref 13.2–17.1)
Lymphs Abs: 1530 cells/uL (ref 850–3900)
MCH: 28.3 pg (ref 27.0–33.0)
MCHC: 34.3 g/dL (ref 32.0–36.0)
MCV: 82.6 fL (ref 80.0–100.0)
MONOS PCT: 8.2 %
MPV: 9.1 fL (ref 7.5–12.5)
NEUTROS PCT: 65.6 %
Neutro Abs: 4461 cells/uL (ref 1500–7800)
PLATELETS: 208 10*3/uL (ref 140–400)
RBC: 5.01 10*6/uL (ref 4.20–5.80)
RDW: 13 % (ref 11.0–15.0)
TOTAL LYMPHOCYTE: 22.5 %
WBC: 6.8 10*3/uL (ref 3.8–10.8)
WBCMIX: 558 {cells}/uL (ref 200–950)

## 2017-04-02 NOTE — Progress Notes (Signed)
Name: Daniel Powell   MRN: [id number]    DOB: 24-Feb-1956   Date:04/02/2017       Progress Note  Subjective  Chief Complaint  Chief Complaint  Patient presents with  . Hypertension    headache for days    Hypertension  This is a recurrent problem. The problem has been gradually worsening since onset. The problem is uncontrolled. Associated symptoms include headaches and malaise/fatigue. Pertinent negatives include no blurred vision, chest pain, palpitations or shortness of breath. (Sore throat) Risk factors for coronary artery disease include diabetes mellitus and dyslipidemia. Past treatments include angiotensin blockers and diuretics.   Patient presents with complaints of not feeling well, headaches, blood pressure has been elevated, also has occasional productive cough and   experiencing sore throat.  Patient also brings in an application form [place] Department of citizenship and immigration services, form is to seek exception from testing for [place] history and civics based on patient's complaints of memory loss for which he was referred to neurology and was diagnosed with depression and PTSD.  Past Medical History:  Diagnosis Date  . Diabetes mellitus without complication [facility])     Past Surgical History:  Procedure Laterality Date  . BACK SURGERY  2014    Family History  Problem Relation Age of Onset  . Heart disease Father     Social History   Social History  . Marital status: Legally Separated    Spouse name: N/A  . Number of children: N/A  . Years of education: N/A   Occupational History  . Not on file.   Social History Main Topics  . Smoking status: Never Smoker  . Smokeless tobacco: Never Used  . Alcohol use No  . Drug use: No  . Sexual activity: Not on file   Other Topics Concern  . Not on file   Social History Narrative   Lives at home with son.  Does not work.  Has significant other.       Current Outpatient Prescriptions:  .  albuterol (PROVENTIL  HFA;VENTOLIN HFA) 108 (90 Base) MCG/ACT inhaler, Inhale 2 puffs into the lungs every 6 (six) hours as needed., Disp: 1 Inhaler, Rfl: 0 .  benzonatate (TESSALON PERLES) 100 MG capsule, Take 1 capsule (100 mg total) by mouth every 6 (six) hours as needed for cough., Disp: 15 capsule, Rfl: 0 .  cephALEXin (KEFLEX) 500 MG capsule, Take 1 capsule (500 mg total) by mouth 3 (three) times daily., Disp: 30 capsule, Rfl: 0 .  gabapentin (NEURONTIN) 300 MG capsule, Take 300 mg by mouth at bedtime., Disp: , Rfl:  .  glimepiride (AMARYL) 4 MG tablet, Take 1 tablet (4 mg total) by mouth daily., Disp: 90 tablet, Rfl: 0 .  glimepiride (AMARYL) 4 MG tablet, Take 1 tablet (4 mg total) by mouth 2 (two) times daily after a meal., Disp: 180 tablet, Rfl: 0 .  linagliptin (TRADJENTA) 5 MG TABS tablet, Take 1 tablet (5 mg total) by mouth daily., Disp: 90 tablet, Rfl: 0 .  losartan-hydrochlorothiazide (HYZAAR) 50-12.5 MG tablet, Take 1 tablet by mouth daily., Disp: 30 tablet, Rfl: 0 .  metFORMIN (GLUCOPHAGE) 1000 MG tablet, Take 1 tablet (1,000 mg total) by mouth 2 (two) times daily with a meal., Disp: 180 tablet, Rfl: 1 .  metFORMIN (GLUCOPHAGE) 1000 MG tablet, Take 1 tablet (1,000 mg total) by mouth 2 (two) times daily with a meal., Disp: 180 tablet, Rfl: 0 .  mupirocin ointment (BACTROBAN) 2 %, Apply 1 application topically 2 (  two) times daily., Disp: 22 g, Rfl: 0 .  naproxen (NAPROSYN) 500 MG tablet, , Disp: , Rfl:  .  predniSONE (DELTASONE) 20 MG tablet, Take 3 tablets (60 mg total) by mouth daily., Disp: 12 tablet, Rfl: 0 .  simvastatin (ZOCOR) 40 MG tablet, Take 1 tablet (40 mg total) by mouth daily at 6 PM., Disp: 90 tablet, Rfl: 0 .  simvastatin (ZOCOR) 40 MG tablet, Take 1 tablet (40 mg total) by mouth at bedtime., Disp: 30 tablet, Rfl: 0 .  tamsulosin (FLOMAX) 0.4 MG CAPS capsule, Take 0.4 mg by mouth at bedtime., Disp: , Rfl:   Allergies  Allergen Reactions  . Gadolinium Derivatives Itching    Pt began  itching across ribs and back and neck s/p Multihance injection     Review of Systems  Constitutional: Positive for malaise/fatigue. Negative for chills and fever.  HENT: Positive for sore throat. Negative for sinus pain.   Eyes: Negative for blurred vision.  Respiratory: Positive for cough. Negative for hemoptysis and shortness of breath.   Cardiovascular: Negative for chest pain and palpitations.  Gastrointestinal: Negative for abdominal pain, nausea and vomiting.  Neurological: Positive for dizziness and headaches.     Objective  Vitals:   04/02/17 1442  BP: (!) 160/82  Pulse: 100  Resp: 16  Temp: (!) 87.3 F (30.7 C)  TempSrc: Oral  SpO2: 97%  Weight: 159 lb 4.8 oz (72.3 kg)  Height: 5\' 8"  (1.727 m)    Physical Exam  Constitutional: He is well-developed, well-nourished, and in no distress.  HENT:  Head: Normocephalic and atraumatic.  Mouth/Throat: Oropharynx is clear and moist. No posterior oropharyngeal edema or posterior oropharyngeal erythema.  Neck: Neck supple.  Cardiovascular: Normal rate, regular rhythm and normal heart sounds.   No murmur heard. Pulmonary/Chest: Effort normal and breath sounds normal. No respiratory distress. He has no wheezes. He has no rales.  Musculoskeletal: He exhibits no edema.  Psychiatric: Mood, memory, affect and judgment normal.  Nursing note and vitals reviewed.      Recent Results (from the past 2160 hour(s))  TSH     Status: None   Collection Time: 01/21/17  4:21 PM  Result Value Ref Range   TSH 1.15 0.40 - 4.50 mIU/L  B12     Status: None   Collection Time: 01/21/17  4:21 PM  Result Value Ref Range   Vitamin B-12 268 200 - 1,100 pg/mL     Assessment & Plan  1. Essential hypertension Patient has been reportedly taking a blood pressure medication prescribed by a different community clinic, he is not likely taking losartan/HCTZ which is on his medication list. Blood pressure is improved on manual repeat, advised to  follow up in one day, must bring all medications with him and will assess any changes in treatment  2. Productive cough  rule out pneumonia by obtaining chest x-ray, obtain CBC. - CBC with Differential/Platelet - COMPLETE METABOLIC PANEL WITH GFR - DG Chest 2 View; Future  3. Moderate episode of recurrent major depressive disorder [facility]) Reviewed most from neurology, he was advised to be referred to psychology/psychiatry for treatment of depression. After reviewing the form, would recommend that the patient be referred to psychiatry for management and the physician can then decide on filling out the form if appropriate.  Interpreter ID#: [id number] Note: Total face-to-face time, 40 minutes, greater than 50% was spent in counseling and coordination of care with the patient. This included reviewing history using interpretation services, obtaining physical exam,  ordering and explaining workup and reviewing the form.   Fines Kimberlin [facility] 04/02/2017 3:06 PM

## 2017-04-03 ENCOUNTER — Encounter: Payer: Self-pay | Admitting: Family Medicine

## 2017-04-03 ENCOUNTER — Ambulatory Visit (INDEPENDENT_AMBULATORY_CARE_PROVIDER_SITE_OTHER): Payer: Medicaid Other | Admitting: Family Medicine

## 2017-04-03 VITALS — BP 120/70 | HR 92 | Temp 98.1°F | Resp 16 | Ht 68.0 in | Wt 160.8 lb

## 2017-04-03 DIAGNOSIS — I10 Essential (primary) hypertension: Secondary | ICD-10-CM | POA: Diagnosis not present

## 2017-04-03 DIAGNOSIS — F331 Major depressive disorder, recurrent, moderate: Secondary | ICD-10-CM

## 2017-04-03 DIAGNOSIS — E1142 Type 2 diabetes mellitus with diabetic polyneuropathy: Secondary | ICD-10-CM | POA: Diagnosis not present

## 2017-04-03 DIAGNOSIS — E1165 Type 2 diabetes mellitus with hyperglycemia: Secondary | ICD-10-CM | POA: Diagnosis not present

## 2017-04-03 DIAGNOSIS — E785 Hyperlipidemia, unspecified: Secondary | ICD-10-CM

## 2017-04-03 LAB — GLUCOSE, POCT (MANUAL RESULT ENTRY): POC Glucose: 293 mg/dl — AB (ref 70–99)

## 2017-04-03 MED ORDER — GLIMEPIRIDE 4 MG PO TABS: 4.0000 mg | ORAL_TABLET | Freq: Two times a day (BID) | ORAL | 0 refills | Status: DC

## 2017-04-03 MED ORDER — SIMVASTATIN 40 MG PO TABS: 40.0000 mg | ORAL_TABLET | Freq: Every day | ORAL | 0 refills | Status: DC

## 2017-04-03 MED ORDER — GABAPENTIN 300 MG PO CAPS: 300.0000 mg | ORAL_CAPSULE | Freq: Every day | ORAL | 0 refills | Status: DC

## 2017-04-03 MED ORDER — GLIMEPIRIDE 2 MG PO TABS: 2.0000 mg | ORAL_TABLET | Freq: Two times a day (BID) | ORAL | 0 refills | Status: DC

## 2017-04-03 MED ORDER — METFORMIN HCL 1000 MG PO TABS: 1000.0000 mg | ORAL_TABLET | Freq: Two times a day (BID) | ORAL | 1 refills | Status: DC

## 2017-04-03 MED ORDER — LOSARTAN POTASSIUM-HCTZ 50-12.5 MG PO TABS: 1.0000 | ORAL_TABLET | Freq: Every day | ORAL | 0 refills | Status: DC

## 2017-04-03 NOTE — Progress Notes (Signed)
Name: Daniel Powell   MRN: [id number]    DOB: 02-Aug-1955   Date:04/03/2017       Progress Note  Subjective  Chief Complaint  Chief Complaint  Patient presents with  . Follow-up    Diabetes  He presents for his follow-up diabetic visit. He has type 2 diabetes mellitus. His disease course has been stable. Pertinent negatives for hypoglycemia include no dizziness, headaches, nervousness/anxiousness or sweats. Associated symptoms include foot paresthesias and polydipsia. Pertinent negatives for diabetes include no blurred vision, no chest pain, no fatigue and no polyuria (he is on Tamsulosin and has difficulty urinating if he goes off Tamsulosin). There are no diabetic complications. Pertinent negatives for diabetic complications include no CVA or heart disease. Current diabetic treatment includes oral agent (dual therapy). His weight is stable. He is following a generally healthy diet. He rarely participates in exercise. He monitors blood glucose at home 1-2 x per week. His breakfast blood glucose range is generally 140-180 mg/dl. An ACE inhibitor/angiotensin II receptor blocker is being taken.  Hyperlipidemia  This is a chronic problem. The problem is uncontrolled. Recent lipid tests were reviewed and are high (elevated triglycerides). Pertinent negatives include no chest pain, leg pain, myalgias or shortness of breath. Current antihyperlipidemic treatment includes statins. Risk factors for coronary artery disease include diabetes mellitus, dyslipidemia, male sex and stress.  Hypertension  This is a chronic problem. The problem is unchanged. The problem is controlled. Pertinent negatives include no blurred vision, chest pain, headaches, palpitations, shortness of breath or sweats. Past treatments include angiotensin blockers and diuretics. There is no history of CVA.      Past Medical History:  Diagnosis Date  . Diabetes mellitus without complication [facility])     Past Surgical History:   Procedure Laterality Date  . BACK SURGERY  2014    Family History  Problem Relation Age of Onset  . Heart disease Father     Social History   Social History  . Marital status: Legally Separated    Spouse name: N/A  . Number of children: N/A  . Years of education: N/A   Occupational History  . Not on file.   Social History Main Topics  . Smoking status: Never Smoker  . Smokeless tobacco: Never Used  . Alcohol use No  . Drug use: No  . Sexual activity: Not on file   Other Topics Concern  . Not on file   Social History Narrative   Lives at home with son.  Does not work.  Has significant other.       Current Outpatient Prescriptions:  .  albuterol (PROVENTIL HFA;VENTOLIN HFA) 108 (90 Base) MCG/ACT inhaler, Inhale 2 puffs into the lungs every 6 (six) hours as needed., Disp: 1 Inhaler, Rfl: 0 .  benzonatate (TESSALON PERLES) 100 MG capsule, Take 1 capsule (100 mg total) by mouth every 6 (six) hours as needed for cough., Disp: 15 capsule, Rfl: 0 .  cephALEXin (KEFLEX) 500 MG capsule, Take 1 capsule (500 mg total) by mouth 3 (three) times daily., Disp: 30 capsule, Rfl: 0 .  gabapentin (NEURONTIN) 300 MG capsule, Take 300 mg by mouth at bedtime., Disp: , Rfl:  .  glimepiride (AMARYL) 4 MG tablet, Take 1 tablet (4 mg total) by mouth daily., Disp: 90 tablet, Rfl: 0 .  glimepiride (AMARYL) 4 MG tablet, Take 1 tablet (4 mg total) by mouth 2 (two) times daily after a meal., Disp: 180 tablet, Rfl: 0 .  linagliptin (TRADJENTA) 5 MG TABS  tablet, Take 1 tablet (5 mg total) by mouth daily., Disp: 90 tablet, Rfl: 0 .  losartan-hydrochlorothiazide (HYZAAR) 50-12.5 MG tablet, Take 1 tablet by mouth daily., Disp: 30 tablet, Rfl: 0 .  metFORMIN (GLUCOPHAGE) 1000 MG tablet, Take 1 tablet (1,000 mg total) by mouth 2 (two) times daily with a meal., Disp: 180 tablet, Rfl: 1 .  metFORMIN (GLUCOPHAGE) 1000 MG tablet, Take 1 tablet (1,000 mg total) by mouth 2 (two) times daily with a meal., Disp: 180  tablet, Rfl: 0 .  mupirocin ointment (BACTROBAN) 2 %, Apply 1 application topically 2 (two) times daily., Disp: 22 g, Rfl: 0 .  naproxen (NAPROSYN) 500 MG tablet, , Disp: , Rfl:  .  predniSONE (DELTASONE) 20 MG tablet, Take 3 tablets (60 mg total) by mouth daily., Disp: 12 tablet, Rfl: 0 .  simvastatin (ZOCOR) 40 MG tablet, Take 1 tablet (40 mg total) by mouth daily at 6 PM., Disp: 90 tablet, Rfl: 0 .  simvastatin (ZOCOR) 40 MG tablet, Take 1 tablet (40 mg total) by mouth at bedtime., Disp: 30 tablet, Rfl: 0 .  tamsulosin (FLOMAX) 0.4 MG CAPS capsule, Take 0.4 mg by mouth at bedtime., Disp: , Rfl:   Allergies  Allergen Reactions  . Gadolinium Derivatives Itching    Pt began itching across ribs and back and neck s/p Multihance injection     Review of Systems  Constitutional: Negative for fatigue.  Eyes: Negative for blurred vision.  Respiratory: Negative for shortness of breath.   Cardiovascular: Negative for chest pain and palpitations.  Musculoskeletal: Negative for myalgias.  Neurological: Negative for dizziness and headaches.  Endo/Heme/Allergies: Positive for polydipsia.  Psychiatric/Behavioral: The patient is not nervous/anxious.      Objective  Vitals:   04/03/17 1012  BP: 120/70  Pulse: 92  Resp: 16  Temp: 98.1 F (36.7 C)  TempSrc: Oral  SpO2: 96%  Weight: 160 lb 12.8 oz (72.9 kg)  Height: 5\' 8"  (1.727 m)    Physical Exam  Constitutional: He is oriented to person, place, and time and well-developed, well-nourished, and in no distress.  HENT:  Head: Normocephalic and atraumatic.  Cardiovascular: Normal rate, regular rhythm and normal heart sounds.   No murmur heard. Pulmonary/Chest: Effort normal and breath sounds normal. He has no wheezes.  Abdominal: Soft. Bowel sounds are normal. There is no tenderness.  Musculoskeletal: He exhibits no edema.  Neurological: He is alert and oriented to person, place, and time.  Psychiatric: Mood, memory, affect and  judgment normal.  Nursing note and vitals reviewed.    Assessment & Plan  1. Poorly controlled type 2 diabetes mellitus with peripheral neuropathy (HCC) Point-of-care A1c is 8.4%, no change in pharmacotherapy for now, reviewed and reconciled medications. - POCT glycosylated hemoglobin (Hb A1C) - glimepiride (AMARYL) 4 MG tablet; Take 1 tablet (4 mg total) by mouth 2 (two) times daily after a meal.  Dispense: 180 tablet; Refill: 0 - metFORMIN (GLUCOPHAGE) 1000 MG tablet; Take 1 tablet (1,000 mg total) by mouth 2 (two) times daily with a meal.  Dispense: 180 tablet; Refill: 1 - gabapentin (NEURONTIN) 300 MG capsule; Take 1 capsule (300 mg total) by mouth at bedtime.  Dispense: 90 capsule; Refill: 0 - glimepiride (AMARYL) 2 MG tablet; Take 1 tablet (2 mg total) by mouth 2 (two) times daily.  Dispense: 180 tablet; Refill: 0 - POCT Glucose (CBG)  2. Dyslipidemia Continue on statin, obtain FLP - Lipid panel - simvastatin (ZOCOR) 40 MG tablet; Take 1 tablet (40 mg total)  by mouth daily at 6 PM.  Dispense: 90 tablet; Refill: 0  3. HTN (hypertension), benign BP is now much better controlled, continue losartan-HCTZ - losartan-hydrochlorothiazide (HYZAAR) 50-12.5 MG tablet; Take 1 tablet by mouth daily.  Dispense: 90 tablet; Refill: 0  4. Moderate episode of recurrent major depressive disorder [facility]) Review of neurology notes, referral to psychiatry for management of major depression and posttraumatic stress disorder - Ambulatory referral to Psychiatry Interpreter ID #: [id number]  [place] 04/03/2017 10:16 AM

## 2017-04-04 LAB — POCT GLYCOSYLATED HEMOGLOBIN (HGB A1C): Hemoglobin A1C: 8.4

## 2017-04-07 ENCOUNTER — Ambulatory Visit: Payer: Medicaid Other | Admitting: Family Medicine

## 2017-04-07 ENCOUNTER — Telehealth: Payer: Self-pay

## 2017-04-07 NOTE — Telephone Encounter (Signed)
-----   Message from Roselee Nova, MD sent at 04/03/2017  9:25 AM EDT ----- Chest x-ray shows no acute cardiopulmonary abnormality or disease

## 2017-04-07 NOTE — Telephone Encounter (Signed)
Used interpreter services to call pt. No answer. Had interpreter LM for pt to call back. Will also try again with interpreter.

## 2017-04-09 NOTE — Telephone Encounter (Signed)
Called pt using interpreter services, no answer. Unable to leave message as no voicemail is set up.

## 2017-04-10 ENCOUNTER — Encounter: Payer: Self-pay | Admitting: Family Medicine

## 2017-04-10 ENCOUNTER — Ambulatory Visit (INDEPENDENT_AMBULATORY_CARE_PROVIDER_SITE_OTHER): Payer: Medicaid Other | Admitting: Family Medicine

## 2017-04-10 VITALS — BP 126/87 | HR 81 | Temp 97.7°F | Resp 14 | Ht 68.0 in | Wt 160.0 lb

## 2017-04-10 DIAGNOSIS — R059 Cough, unspecified: Secondary | ICD-10-CM

## 2017-04-10 DIAGNOSIS — R05 Cough: Secondary | ICD-10-CM | POA: Diagnosis not present

## 2017-04-10 LAB — LIPID PANEL
Cholesterol: 171 mg/dL (ref <200)
HDL: 31 mg/dL — ABNORMAL LOW (ref >40)
NON-HDL CHOLESTEROL (CALC): 140 mg/dL — AB (ref <130)
Total CHOL/HDL Ratio: 5.5 (calc) — ABNORMAL HIGH (ref <5.0)
Triglycerides: 402 mg/dL — ABNORMAL HIGH (ref <150)

## 2017-04-10 MED ORDER — OMEPRAZOLE 20 MG PO CPDR: 20.0000 mg | DELAYED_RELEASE_CAPSULE | Freq: Every day | ORAL | 0 refills | Status: DC

## 2017-04-10 NOTE — Progress Notes (Signed)
Name: Daniel Powell   MRN: [id number]    DOB: [birth year]   Date:04/10/2017       Progress Note  Subjective  Chief Complaint  Chief Complaint  Patient presents with  . Gastroesophageal Reflux    salty taste  . Hemorrhoids  . Migraine    hard to focus.   . Labs Only    recheck lipid panel    Cough  This is a recurrent problem. The current episode started more than 1 month ago. The problem has been unchanged. The cough is productive of sputum (mainly dry but sometimes with mucus). Associated symptoms include headaches and heartburn. Pertinent negatives include no chills, fever, sore throat, shortness of breath, weight loss or wheezing. He has tried nothing for the symptoms. There is no history of asthma, COPD, environmental allergies or pneumonia.     Past Medical History:  Diagnosis Date  . Diabetes mellitus without complication [facility])     Past Surgical History:  Procedure Laterality Date  . BACK SURGERY  2014    Family History  Problem Relation Age of Onset  . Heart disease Father     Social History   Social History  . Marital status: Legally Separated    Spouse name: N/A  . Number of children: N/A  . Years of education: N/A   Occupational History  . Not on file.   Social History Main Topics  . Smoking status: Never Smoker  . Smokeless tobacco: Never Used  . Alcohol use No  . Drug use: No  . Sexual activity: Not on file   Other Topics Concern  . Not on file   Social History Narrative   Lives at home with son.  Does not work.  Has significant other.       Current Outpatient Prescriptions:  .  albuterol (PROVENTIL HFA;VENTOLIN HFA) 108 (90 Base) MCG/ACT inhaler, Inhale 2 puffs into the lungs every 6 (six) hours as needed., Disp: 1 Inhaler, Rfl: 0 .  benzonatate (TESSALON PERLES) 100 MG capsule, Take 1 capsule (100 mg total) by mouth every 6 (six) hours as needed for cough., Disp: 15 capsule, Rfl: 0 .  cephALEXin (KEFLEX) 500 MG capsule, Take 1 capsule (500  mg total) by mouth 3 (three) times daily., Disp: 30 capsule, Rfl: 0 .  gabapentin (NEURONTIN) 300 MG capsule, Take 1 capsule (300 mg total) by mouth at bedtime., Disp: 90 capsule, Rfl: 0 .  glimepiride (AMARYL) 2 MG tablet, Take 1 tablet (2 mg total) by mouth 2 (two) times daily., Disp: 180 tablet, Rfl: 0 .  glimepiride (AMARYL) 4 MG tablet, Take 1 tablet (4 mg total) by mouth 2 (two) times daily after a meal., Disp: 180 tablet, Rfl: 0 .  losartan-hydrochlorothiazide (HYZAAR) 50-12.5 MG tablet, Take 1 tablet by mouth daily., Disp: 90 tablet, Rfl: 0 .  metFORMIN (GLUCOPHAGE) 1000 MG tablet, Take 1 tablet (1,000 mg total) by mouth 2 (two) times daily with a meal., Disp: 180 tablet, Rfl: 0 .  metFORMIN (GLUCOPHAGE) 1000 MG tablet, Take 1 tablet (1,000 mg total) by mouth 2 (two) times daily with a meal., Disp: 180 tablet, Rfl: 1 .  mupirocin ointment (BACTROBAN) 2 %, Apply 1 application topically 2 (two) times daily., Disp: 22 g, Rfl: 0 .  naproxen (NAPROSYN) 500 MG tablet, , Disp: , Rfl:  .  simvastatin (ZOCOR) 40 MG tablet, Take 1 tablet (40 mg total) by mouth daily at 6 PM., Disp: 90 tablet, Rfl: 0 .  tamsulosin (FLOMAX) 0.4 MG  CAPS capsule, Take 0.4 mg by mouth at bedtime., Disp: , Rfl:  .  glimepiride (AMARYL) 4 MG tablet, Take 1 tablet (4 mg total) by mouth daily. (Patient not taking: Reported on 04/10/2017), Disp: 90 tablet, Rfl: 0 .  simvastatin (ZOCOR) 40 MG tablet, Take 1 tablet (40 mg total) by mouth at bedtime. (Patient not taking: Reported on 04/10/2017), Disp: 30 tablet, Rfl: 0  Allergies  Allergen Reactions  . Gadolinium Derivatives Itching    Pt began itching across ribs and back and neck s/p Multihance injection     Review of Systems  Constitutional: Negative for chills, fever and weight loss.  HENT: Negative for sore throat.   Respiratory: Positive for cough. Negative for shortness of breath and wheezing.   Gastrointestinal: Positive for heartburn.  Neurological: Positive for  headaches.  Endo/Heme/Allergies: Negative for environmental allergies.      Objective  Vitals:   04/10/17 1120  BP: 126/87  Pulse: 81  Resp: 14  Temp: 97.7 F (36.5 C)  TempSrc: Oral  SpO2: 98%  Weight: 160 lb (72.6 kg)  Height: 5\' 8"  (1.727 m)    Physical Exam  Constitutional: He is oriented to person, place, and time and well-developed, well-nourished, and in no distress.  HENT:  Mouth/Throat: Posterior oropharyngeal erythema present.  Cardiovascular: Normal rate, regular rhythm and normal heart sounds.   No murmur heard. Pulmonary/Chest: Effort normal and breath sounds normal. He has no wheezes.  Abdominal: Soft. Bowel sounds are normal. There is no tenderness.  Neurological: He is alert and oriented to person, place, and time.  Psychiatric: Mood, memory, affect and judgment normal.  Nursing note and vitals reviewed.    Recent Results (from the past 2160 hour(s))  TSH     Status: None   Collection Time: 01/21/17  4:21 PM  Result Value Ref Range   TSH 1.15 0.40 - 4.50 mIU/L  B12     Status: None   Collection Time: 01/21/17  4:21 PM  Result Value Ref Range   Vitamin B-12 268 200 - 1,100 pg/mL  CBC with Differential/Platelet     Status: None   Collection Time: 04/02/17  3:38 PM  Result Value Ref Range   WBC 6.8 3.8 - 10.8 Thousand/uL   RBC 5.01 4.20 - 5.80 Million/uL   Hemoglobin 14.2 13.2 - 17.1 g/dL   HCT 41.4 38.5 - 50.0 %   MCV 82.6 80.0 - 100.0 fL   MCH 28.3 27.0 - 33.0 pg   MCHC 34.3 32.0 - 36.0 g/dL   RDW 13.0 11.0 - 15.0 %   Platelets 208 140 - 400 Thousand/uL   MPV 9.1 7.5 - 12.5 fL   Neutro Abs 4,461 1,500 - 7,800 cells/uL   Lymphs Abs 1,530 850 - 3,900 cells/uL   WBC mixed population 558 200 - 950 cells/uL   Eosinophils Absolute 211 15 - 500 cells/uL   Basophils Absolute 41 0 - 200 cells/uL   Neutrophils Relative % 65.6 %   Total Lymphocyte 22.5 %   Monocytes Relative 8.2 %   Eosinophils Relative 3.1 %   Basophils Relative 0.6 %  COMPLETE  METABOLIC PANEL WITH GFR     Status: Abnormal   Collection Time: 04/02/17  3:38 PM  Result Value Ref Range   Glucose, Bld 243 (H) 65 - 139 mg/dL    Comment: .        Non-fasting reference interval .    BUN 20 7 - 25 mg/dL   Creat 1.11 0.70 -  1.33 mg/dL    Comment: For patients >63 years of age, the reference limit for Creatinine is approximately 13% higher for people identified as African-American. .    GFR, Est Non African American 72 > OR = 60 mL/min/1.81m2   GFR, Est African American 84 > OR = 60 mL/min/1.90m2   BUN/Creatinine Ratio NOT APPLICABLE 6 - 22 (calc)   Sodium 136 135 - 146 mmol/L   Potassium 4.1 3.5 - 5.3 mmol/L   Chloride 96 (L) 98 - 110 mmol/L   CO2 28 20 - 32 mmol/L   Calcium 9.6 8.6 - 10.3 mg/dL   Total Protein 7.6 6.1 - 8.1 g/dL   Albumin 4.5 3.6 - 5.1 g/dL   Globulin 3.1 1.9 - 3.7 g/dL (calc)   AG Ratio 1.5 1.0 - 2.5 (calc)   Total Bilirubin 1.1 0.2 - 1.2 mg/dL   Alkaline phosphatase (APISO) 64 40 - 115 U/L   AST 17 10 - 35 U/L   ALT 17 9 - 46 U/L  POCT Glucose (CBG)     Status: Abnormal   Collection Time: 04/03/17 11:03 AM  Result Value Ref Range   POC Glucose 293 (A) 70 - 99 mg/dl  POCT glycosylated hemoglobin (Hb A1C)     Status: None   Collection Time: 04/04/17  3:14 PM  Result Value Ref Range   Hemoglobin A1C 8.4      Assessment & Plan  1. Cough in adult Suspect cough is from reflux, start on PPI, reassess in 4 weeks. - omeprazole (PRILOSEC) 20 MG capsule; Take 1 capsule (20 mg total) by mouth daily.  Dispense: 30 capsule; Refill: 0  Interpreter ID#: [id number] Name: Charlett Nose Asad A. [facility] 04/10/2017 11:40 AM

## 2017-04-14 NOTE — Telephone Encounter (Addendum)
Pt's son called said PCP wants Dr Mamie Nick to complete the paperwork. Pt said his father is getting worse. Please call to discuss at [phone]

## 2017-04-15 NOTE — Telephone Encounter (Signed)
Discussed with Dr Leta Baptist. Dr Leta Baptist will call patient's son to discuss.

## 2017-04-16 NOTE — Telephone Encounter (Signed)
I called several times on multiple numbers. I spoke to nephew, but not son. -VRP

## 2017-04-17 NOTE — Telephone Encounter (Signed)
Pt's son returned providers call.  He can be reached at [phone]

## 2017-04-23 ENCOUNTER — Other Ambulatory Visit: Payer: Self-pay | Admitting: Family Medicine

## 2017-04-23 ENCOUNTER — Ambulatory Visit: Payer: Medicaid Other | Admitting: Family Medicine

## 2017-04-23 DIAGNOSIS — E781 Pure hyperglyceridemia: Secondary | ICD-10-CM

## 2017-04-23 DIAGNOSIS — E1165 Type 2 diabetes mellitus with hyperglycemia: Secondary | ICD-10-CM

## 2017-04-23 MED ORDER — ICOSAPENT ETHYL 1 G PO CAPS: 2.0000 | ORAL_CAPSULE | Freq: Two times a day (BID) | ORAL | 0 refills | Status: DC

## 2017-04-23 MED ORDER — ATORVASTATIN CALCIUM 20 MG PO TABS: ORAL_TABLET | ORAL | 0 refills | Status: DC

## 2017-04-23 MED ORDER — SITAGLIPTIN PHOSPHATE 100 MG PO TABS: 100.0000 mg | ORAL_TABLET | Freq: Every day | ORAL | 0 refills | Status: DC

## 2017-04-23 MED ORDER — ATORVASTATIN CALCIUM 20 MG PO TABS: 20.0000 mg | ORAL_TABLET | Freq: Every day | ORAL | 0 refills | Status: DC

## 2017-04-23 MED ORDER — ICOSAPENT ETHYL 1 G PO CAPS: ORAL_CAPSULE | ORAL | 0 refills | Status: DC

## 2017-04-23 NOTE — Progress Notes (Signed)
Patient followed up for elevated triglycerides, his triglyceride level was 402, LDL was not calculated because of triglycerides over 400. DC simvastatin, start on atorvastatin 20 mg daily, started on Vascepa 2g twice daily, for diabetes will add Januvia 100 mg daily for an A1c of 8.4%. Follow-up in 3 months

## 2017-04-24 NOTE — Progress Notes (Signed)
Patient was seen for nurse visit today to adjust his medications. He has elevated triglycerides at 40 2 mg/dL, LDL could not be calculated because of triglycerides over 400. Simvastatin was discontinued, he was started on atorvastatin milligram at bedtime. In addition, he was started on Vascepa 2 g twice a day with meals for elevated triglycerides. For A1c of 8.4% define uncontrolled diabetes, he was started on Januvia milligrams daily, advised to decrease them upright to 4 mg twice a day, recheck A1c in 3 months Medical interpreter ID [phone]

## 2017-04-29 ENCOUNTER — Ambulatory Visit: Payer: Medicaid Other | Admitting: Psychiatry

## 2017-05-07 ENCOUNTER — Telehealth: Payer: Self-pay

## 2017-05-07 NOTE — Telephone Encounter (Signed)
He should find out alternative treatment options that his insurance will cover.

## 2017-05-07 NOTE — Telephone Encounter (Signed)
Pt came by today and stated to me that his insurance will not cover vascepa. Please advise

## 2017-05-12 ENCOUNTER — Other Ambulatory Visit: Payer: Self-pay | Admitting: Family Medicine

## 2017-05-12 DIAGNOSIS — E1142 Type 2 diabetes mellitus with diabetic polyneuropathy: Secondary | ICD-10-CM

## 2017-05-12 DIAGNOSIS — E1165 Type 2 diabetes mellitus with hyperglycemia: Principal | ICD-10-CM

## 2017-05-12 NOTE — Telephone Encounter (Signed)
Spoke to the pharmacist and she stated its cover and he only have to pay $3 for the medicine. Also its bee filled and he will need to pick it up today or it will go back . Called the pt brother and he was informed of my findings and the medication will need to be picked up today or it will go back.

## 2017-05-30 ENCOUNTER — Ambulatory Visit (INDEPENDENT_AMBULATORY_CARE_PROVIDER_SITE_OTHER): Payer: Self-pay | Admitting: Psychiatry

## 2017-05-30 ENCOUNTER — Encounter: Payer: Self-pay | Admitting: Psychiatry

## 2017-05-30 VITALS — BP 130/57 | HR 84 | Wt 156.0 lb

## 2017-05-30 DIAGNOSIS — F09 Unspecified mental disorder due to known physiological condition: Secondary | ICD-10-CM

## 2017-05-30 DIAGNOSIS — Z634 Disappearance and death of family member: Secondary | ICD-10-CM

## 2017-05-30 DIAGNOSIS — F5105 Insomnia due to other mental disorder: Secondary | ICD-10-CM

## 2017-05-30 DIAGNOSIS — F331 Major depressive disorder, recurrent, moderate: Secondary | ICD-10-CM

## 2017-05-30 MED ORDER — FLUOXETINE HCL 20 MG PO CAPS: 20.0000 mg | ORAL_CAPSULE | Freq: Every day | ORAL | 1 refills | Status: DC

## 2017-05-30 MED ORDER — TRAZODONE HCL 50 MG PO TABS: 25.0000 mg | ORAL_TABLET | Freq: Every evening | ORAL | 1 refills | Status: DC | PRN

## 2017-05-30 NOTE — Progress Notes (Signed)
Psychiatric Initial Adult Assessment   Patient Identification: Daniel Powell MRN:  [id number] Date of Evaluation:  05/30/2017 Referral Source: Dr.Syed Manuella Ghazi Chief Complaint: " I am depressed and have memory issues.'  Chief Complaint    Depression     Visit Diagnosis:    ICD-10-CM   1. MDD (major depressive disorder), recurrent episode, moderate (HCC) F33.1 traZODone (DESYREL) 50 MG tablet    FLUoxetine (PROZAC) 20 MG capsule  2. Cognitive disorder F09   3. Insomnia due to mental disorder F51.05   4. Bereavement Z63.4     History of Present Illness: Slade is a 61 year old Arabic male, divorced, lives in [place], has a history of depression as well as memory issues and sleep problems who presented to the clinic today to establish care.  Edvardo presented today with his cousin.  His cousin helped with the evaluation process.  Patient has limited knowledge of English per report.  His cousin assisted with translation to arabic.  Patient reports feeling sad, having low energy, low concentration, low appetite, crying spells as well as sleep issues since the past few years.  Patient reports his symptoms are worsening since the past few months.  Patient denies any suicidality or homicidality.  Patient reports several psychosocial stressors. -Patient had a business in [place] years ago, but was cheated, lost a lot of money and hence had to come to [place] to live with his son. -Patient misses his home, wants to return to [place], however he does not have the legal documents to return.  He was born in [place], had temporary passport to live in [place].  His passport cannot be extended anymore and hence he cannot return to [place] or [place] at this time.  Patient has 4 children who still live in [place].  His mother still lives in [place].  His father passed away in 10/30/16.  Patient could not attend his father's funeral.  He continues to miss his children as well as his mother on a regular  basis.    Pt reports memory problems on a regular basis.  He reports being very forgetful.  Attempted to do an MMSE/MOCA on patient.  Tried the Arabic version of MOCA.  Patient however still had some problems with comprehension and processing the test and hence the test is incomplete.  However, based on evaluation today he does have visual-spatial-executive problems, he did the clock with contour and numbers but could not do the time right, he could not copy a cube.  He did have some naming difficulties. His recall for three words - 3/3 - immediate , after 5 minutes 0/3.  He seemed to have problems with attention and concentration.  He was oriented to place,day, situation, the year , but could not tell the date or the month.   He denied any perceptual disturbances, denied past history of mania.  He denied any anxiety attacks or panic attacks.  He denied any history of trauma other than the cheating that happened to him.  He does deal with grief from his father's death.     Associated Signs/Symptoms: Depression Symptoms:  depressed mood, fatigue, difficulty concentrating, anxiety, decreased appetite, (Hypo) Manic Symptoms:  denies Anxiety Symptoms:  anxiety unspecified Psychotic Symptoms:  denies PTSD Symptoms: Negative  Past Psychiatric History: Reports a history of depression in the past, he reports he could not give the details.  He reports he was started on some kind of medication but he did not could not tell the name.  He denies  a history of admission to mental health facilities in the past.  He denies a history of suicide attempts.  Previous Psychotropic Medications: Yes - unknown   Substance Abuse History in the last 12 months:  No.  Consequences of Substance Abuse: Negative  Past Medical History:  Past Medical History:  Diagnosis Date  . Diabetes mellitus without complication [facility])     Past Surgical History:  Procedure Laterality Date  . BACK SURGERY  2014     Family Psychiatric History: His father had dementia. One of his nephews likely has mental illness.  Family History:  Family History  Problem Relation Age of Onset  . Heart attack Mother   . Diabetes Mother   . Hypertension Mother   . Heart disease Father   . Diabetes Father   . Hypertension Father   . Diabetes Sister   . Hypertension Sister     Social History:   Social History   Socioeconomic History  . Marital status: Legally Separated    Spouse name: None  . Number of children: None  . Years of education: None  . Highest education level: None  Social Needs  . Financial resource strain: None  . Food insecurity - worry: None  . Food insecurity - inability: None  . Transportation needs - medical: None  . Transportation needs - non-medical: None  Occupational History  . None  Tobacco Use  . Smoking status: Never Smoker  . Smokeless tobacco: Never Used  Substance and Sexual Activity  . Alcohol use: No    Alcohol/week: 0.0 oz  . Drug use: No  . Sexual activity: None  Other Topics Concern  . None  Social History Narrative   Lives at home with son.  Does not work.  Has significant other.      Additional Social History: He was born in [place].  He used to live in [place], on temporary [place] passports.  He is divorced.  He has 5 children, all adults.  His son lives here in [place], he lives with his son who supports him since the past 7 years or so.  He has 4 other adult children in [place].  He denies any history of legal problems.  Allergies:   Allergies  Allergen Reactions  . Gadolinium Derivatives Itching    Pt began itching across ribs and back and neck s/p Multihance injection    Metabolic Disorder Labs: Lab Results  Component Value Date   HGBA1C 8.4 04/04/2017   No results found for: PROLACTIN Lab Results  Component Value Date   CHOL 171 04/10/2017   TRIG 402 (H) 04/10/2017   HDL 31 (L) 04/10/2017   CHOLHDL 5.5 (H) 04/10/2017   VLDL 48  (H) 01/01/2017   LDLCALC 64 01/01/2017   LDLCALC Comment 03/02/2015     Current Medications: Current Outpatient Medications  Medication Sig Dispense Refill  . albuterol (PROVENTIL HFA;VENTOLIN HFA) 108 (90 Base) MCG/ACT inhaler Inhale 2 puffs into the lungs every 6 (six) hours as needed. 1 Inhaler 0  . atorvastatin (LIPITOR) 20 MG tablet Take 1 tablet (20 mg total) by mouth daily at 6 PM. Take 1 tablet ( 20 mg total ) by mouth at bedtime. 90 tablet 0  . benzonatate (TESSALON PERLES) 100 MG capsule Take 1 capsule (100 mg total) by mouth every 6 (six) hours as needed for cough. 15 capsule 0  . cephALEXin (KEFLEX) 500 MG capsule Take 1 capsule (500 mg total) by mouth 3 (three) times daily. 30 capsule  0  . FLUoxetine (PROZAC) 20 MG capsule Take 1 capsule (20 mg total) by mouth daily. 30 capsule 1  . gabapentin (NEURONTIN) 300 MG capsule Take 1 capsule (300 mg total) by mouth at bedtime. 90 capsule 0  . glimepiride (AMARYL) 4 MG tablet TAKE 1 TABLET BY MOUTH TWICE DAILY AFTER A MEAL 180 tablet 0  . Icosapent Ethyl (VASCEPA) 1 g CAPS Take 2 capsules by mouth 2 (two) times daily after a meal. 360 capsule 0  . losartan-hydrochlorothiazide (HYZAAR) 50-12.5 MG tablet Take 1 tablet by mouth daily. 90 tablet 0  . metFORMIN (GLUCOPHAGE) 1000 MG tablet Take 1 tablet (1,000 mg total) by mouth 2 (two) times daily with a meal. 180 tablet 0  . metFORMIN (GLUCOPHAGE) 1000 MG tablet Take 1 tablet (1,000 mg total) by mouth 2 (two) times daily with a meal. 180 tablet 1  . mupirocin ointment (BACTROBAN) 2 % Apply 1 application topically 2 (two) times daily. 22 g 0  . naproxen (NAPROSYN) 500 MG tablet     . omeprazole (PRILOSEC) 20 MG capsule Take 1 capsule (20 mg total) by mouth daily. 30 capsule 0  . sitaGLIPtin (JANUVIA) 100 MG tablet Take 1 tablet (100 mg total) by mouth daily. 90 tablet 0  . tamsulosin (FLOMAX) 0.4 MG CAPS capsule Take 0.4 mg by mouth at bedtime.    . traZODone (DESYREL) 50 MG tablet Take  0.5-1 tablets (25-50 mg total) by mouth at bedtime as needed for sleep. 30 tablet 1   No current facility-administered medications for this visit.     Neurologic: Headache: Yes Seizure: No Paresthesias:No  Musculoskeletal: Strength & Muscle Tone: within normal limits Gait & Station: normal Patient leans: N/A  Psychiatric Specialty Exam: Review of Systems  Psychiatric/Behavioral: Positive for depression. The patient is nervous/anxious and has insomnia.   All other systems reviewed and are negative.   Blood pressure (!) 130/57, pulse 84, weight 156 lb (70.8 kg).Body mass index is 23.72 kg/m.  General Appearance: Casual  Eye Contact:  Fair  Speech:  Normal Rate  Volume:  Decreased  Mood:  Dysphoric  Affect:  Constricted  Thought Process:  Goal Directed and Descriptions of Associations: Intact  Orientation:  Other:  day, year, place , situation  Thought Content:  Logical  Suicidal Thoughts:  No  Homicidal Thoughts:  No  Memory:  Immediate;   Fair Recent;   Fair Remote;   Fair  Judgement:  Fair  Insight:  Fair  Psychomotor Activity:  Normal  Concentration:  Concentration: Fair and Attention Span: Fair  Recall:  Poor  Fund of Knowledge:Poor  Language: Fair  Akathisia:  No  Handed:  Right  AIMS (if indicated):  NA  Assets:  Desire for Improvement Social Support  ADL's:  Intact  Cognition: Impaired,  Mild  Sleep: poor    Treatment Plan Summary: Dale Lankin is a 61 year old Arabic male, from [place], who currently lives here in [place] with his son since the past 7 years or so.  Patient presented today for memory problems as well as depressive symptoms.  Patient currently has several psychosocial stressors, history of financial loss, loss of business after being cheated by someone, inability to return to [place] due to not having the legal documents and so on.  Patient lost his father April 2018, could not attend his funeral since he did not have the legal documents to return  to [place], patient hence likely could also be dealing with the grief.  Patient currently denies any suicidal ideation.  Patient denies any perceptual disturbances.  Discussed medication management and plan as noted below. Medication management and Plan see below Plan  Depression Prozac 20 mg p.o. Daily Provided medication education, provided handouts.  For insomnia Trazodone 25-50 mg p.o. nightly as needed  Bereavement He will benefit from grief counseling, but does have trouble finding a therapist due to his language barrier.   For cognitive issues His cognitive problems likely due to his mood symptoms /could be multifactorial since he is on multiple medications as well as has chronic medical issues. Due to patient's language barrier MOCA/MMSE -could only be completed partially.  Gave patient an Arabic version of moca -however he had difficulty comprehending parts of it. However based on evaluation today , patient clearly has problems with recall, attention concentration as well as does have some visual-spatial problems. Patient reports he already has a neurologist who works with him.  He can continue to follow up with neurology as scheduled .Will defer to neurology to rule out dementia.  Reviewed the following labs- TSH - wnl ( 01/21/2017) , Vitamin b12 - borderline low ( 01/21/2017) -he may benefit from vitamin B12 replacement.  Will defer this to his PMD.  He will also benefit from the following labs-folate, vitamin D - will defer to PMD .  Reviewed MRI brain - 03/22/2017 -no acute pathology.  Follow-up in 4 weeks or sooner if needed.   Ursula Alert, MD 12/7/201812:58 PM

## 2017-05-30 NOTE — Patient Instructions (Signed)
Trazodone tablets What is this medicine? TRAZODONE (TRAZ oh done) is used to treat depression. This medicine may be used for other purposes; ask your health care provider or pharmacist if you have questions. COMMON BRAND NAME(S): Desyrel What should I tell my health care provider before I take this medicine? They need to know if you have any of these conditions: -attempted suicide or thinking about it -bipolar disorder -bleeding problems -glaucoma -heart disease, or previous heart attack -irregular heart beat -kidney or liver disease -low levels of sodium in the blood -an unusual or allergic reaction to trazodone, other medicines, foods, dyes or preservatives -pregnant or trying to get pregnant -breast-feeding How should I use this medicine? Take this medicine by mouth with a glass of water. Follow the directions on the prescription label. Take this medicine shortly after a meal or a light snack. Take your medicine at regular intervals. Do not take your medicine more often than directed. Do not stop taking this medicine suddenly except upon the advice of your doctor. Stopping this medicine too quickly may cause serious side effects or your condition may worsen. A special MedGuide will be given to you by the pharmacist with each prescription and refill. Be sure to read this information carefully each time. Talk to your pediatrician regarding the use of this medicine in children. Special care may be needed. Overdosage: If you think you have taken too much of this medicine contact a poison control center or emergency room at once. NOTE: This medicine is only for you. Do not share this medicine with others. What if I miss a dose? If you miss a dose, take it as soon as you can. If it is almost time for your next dose, take only that dose. Do not take double or extra doses. What may interact with this medicine? Do not take this medicine with any of the following medications: -certain medicines  for fungal infections like fluconazole, itraconazole, ketoconazole, posaconazole, voriconazole -cisapride -dofetilide -dronedarone -linezolid -MAOIs like Carbex, Eldepryl, Marplan, Nardil, and Parnate -mesoridazine -methylene blue (injected into a vein) -pimozide -saquinavir -thioridazine -ziprasidone This medicine may also interact with the following medications: -alcohol -antiviral medicines for HIV or AIDS -aspirin and aspirin-like medicines -barbiturates like phenobarbital -certain medicines for blood pressure, heart disease, irregular heart beat -certain medicines for depression, anxiety, or psychotic disturbances -certain medicines for migraine headache like almotriptan, eletriptan, frovatriptan, naratriptan, rizatriptan, sumatriptan, zolmitriptan -certain medicines for seizures like carbamazepine and phenytoin -certain medicines for sleep -certain medicines that treat or prevent blood clots like dalteparin, enoxaparin, warfarin -digoxin -fentanyl -lithium -NSAIDS, medicines for pain and inflammation, like ibuprofen or naproxen -other medicines that prolong the QT interval (cause an abnormal heart rhythm) -rasagiline -supplements like St. John's wort, kava kava, valerian -tramadol -tryptophan This list may not describe all possible interactions. Give your health care provider a list of all the medicines, herbs, non-prescription drugs, or dietary supplements you use. Also tell them if you smoke, drink alcohol, or use illegal drugs. Some items may interact with your medicine. What should I watch for while using this medicine? Tell your doctor if your symptoms do not get better or if they get worse. Visit your doctor or health care professional for regular checks on your progress. Because it may take several weeks to see the full effects of this medicine, it is important to continue your treatment as prescribed by your doctor. Patients and their families should watch out for new  or worsening thoughts of suicide or depression. Also   watch out for sudden changes in feelings such as feeling anxious, agitated, panicky, irritable, hostile, aggressive, impulsive, severely restless, overly excited and hyperactive, or not being able to sleep. If this happens, especially at the beginning of treatment or after a change in dose, call your health care professional. Dennis Bast may get drowsy or dizzy. Do not drive, use machinery, or do anything that needs mental alertness until you know how this medicine affects you. Do not stand or sit up quickly, especially if you are an older patient. This reduces the risk of dizzy or fainting spells. Alcohol may interfere with the effect of this medicine. Avoid alcoholic drinks. This medicine may cause dry eyes and blurred vision. If you wear contact lenses you may feel some discomfort. Lubricating drops may help. See your eye doctor if the problem does not go away or is severe. Your mouth may get dry. Chewing sugarless gum, sucking hard candy and drinking plenty of water may help. Contact your doctor if the problem does not go away or is severe. What side effects may I notice from receiving this medicine? Side effects that you should report to your doctor or health care professional as soon as possible: -allergic reactions like skin rash, itching or hives, swelling of the face, lips, or tongue -elevated mood, decreased need for sleep, racing thoughts, impulsive behavior -confusion -fast, irregular heartbeat -feeling faint or lightheaded, falls -feeling agitated, angry, or irritable -loss of balance or coordination -painful or prolonged erections -restlessness, pacing, inability to keep still -suicidal thoughts or other mood changes -tremors -trouble sleeping -seizures -unusual bleeding or bruising Side effects that usually do not require medical attention (report to your doctor or health care professional if they continue or are bothersome): -change in  sex drive or performance -change in appetite or weight -constipation -headache -muscle aches or pains -nausea This list may not describe all possible side effects. Call your doctor for medical advice about side effects. You may report side effects to FDA at 1-800-FDA-1088. Where should I keep my medicine? Keep out of the reach of children. Store at room temperature between 15 and 30 degrees C (59 to 86 degrees F). Protect from light. Keep container tightly closed. Throw away any unused medicine after the expiration date. NOTE: This sheet is a summary. It may not cover all possible information. If you have questions about this medicine, talk to your doctor, pharmacist, or health care provider.  2018 Elsevier/Gold Standard (2015-11-09 16:57:05) Fluoxetine capsules or tablets (Depression/Mood Disorders) What is this medicine? FLUOXETINE (floo OX e teen) belongs to a class of drugs known as selective serotonin reuptake inhibitors (SSRIs). It helps to treat mood problems such as depression, obsessive compulsive disorder, and panic attacks. It can also treat certain eating disorders. This medicine may be used for other purposes; ask your health care provider or pharmacist if you have questions. COMMON BRAND NAME(S): Prozac What should I tell my health care provider before I take this medicine? They need to know if you have any of these conditions: -bipolar disorder or a family history of bipolar disorder -bleeding disorders -glaucoma -heart disease -liver disease -low levels of sodium in the blood -seizures -suicidal thoughts, plans, or attempt; a previous suicide attempt by you or a family member -take MAOIs like Carbex, Eldepryl, Marplan, Nardil, and Parnate -take medicines that treat or prevent blood clots -thyroid disease -an unusual or allergic reaction to fluoxetine, other medicines, foods, dyes, or preservatives -pregnant or trying to get pregnant -breast-feeding How should I use  this medicine? Take this medicine by mouth with a glass of water. Follow the directions on the prescription label. You can take this medicine with or without food. Take your medicine at regular intervals. Do not take it more often than directed. Do not stop taking this medicine suddenly except upon the advice of your doctor. Stopping this medicine too quickly may cause serious side effects or your condition may worsen. A special MedGuide will be given to you by the pharmacist with each prescription and refill. Be sure to read this information carefully each time. Talk to your pediatrician regarding the use of this medicine in children. While this drug may be prescribed for children as young as 7 years for selected conditions, precautions do apply. Overdosage: If you think you have taken too much of this medicine contact a poison control center or emergency room at once. NOTE: This medicine is only for you. Do not share this medicine with others. What if I miss a dose? If you miss a dose, skip the missed dose and go back to your regular dosing schedule. Do not take double or extra doses. What may interact with this medicine? Do not take this medicine with any of the following medications: -other medicines containing fluoxetine, like Sarafem or Symbyax -cisapride -linezolid -MAOIs like Carbex, Eldepryl, Marplan, Nardil, and Parnate -methylene blue (injected into a vein) -pimozide -thioridazine This medicine may also interact with the following medications: -alcohol -amphetamines -aspirin and aspirin-like medicines -carbamazepine -certain medicines for depression, anxiety, or psychotic disturbances -certain medicines for migraine headaches like almotriptan, eletriptan, frovatriptan, naratriptan, rizatriptan, sumatriptan, zolmitriptan -digoxin -diuretics -fentanyl -flecainide -furazolidone -isoniazid -lithium -medicines for sleep -medicines that treat or prevent blood clots like warfarin,  enoxaparin, and dalteparin -NSAIDs, medicines for pain and inflammation, like ibuprofen or naproxen -phenytoin -procarbazine -propafenone -rasagiline -ritonavir -supplements like St. John's wort, kava kava, valerian -tramadol -tryptophan -vinblastine This list may not describe all possible interactions. Give your health care provider a list of all the medicines, herbs, non-prescription drugs, or dietary supplements you use. Also tell them if you smoke, drink alcohol, or use illegal drugs. Some items may interact with your medicine. What should I watch for while using this medicine? Tell your doctor if your symptoms do not get better or if they get worse. Visit your doctor or health care professional for regular checks on your progress. Because it may take several weeks to see the full effects of this medicine, it is important to continue your treatment as prescribed by your doctor. Patients and their families should watch out for new or worsening thoughts of suicide or depression. Also watch out for sudden changes in feelings such as feeling anxious, agitated, panicky, irritable, hostile, aggressive, impulsive, severely restless, overly excited and hyperactive, or not being able to sleep. If this happens, especially at the beginning of treatment or after a change in dose, call your health care professional. Dennis Bast may get drowsy or dizzy. Do not drive, use machinery, or do anything that needs mental alertness until you know how this medicine affects you. Do not stand or sit up quickly, especially if you are an older patient. This reduces the risk of dizzy or fainting spells. Alcohol may interfere with the effect of this medicine. Avoid alcoholic drinks. Your mouth may get dry. Chewing sugarless gum or sucking hard candy, and drinking plenty of water may help. Contact your doctor if the problem does not go away or is severe. This medicine may affect blood sugar levels. If you have  diabetes, check with  your doctor or health care professional before you change your diet or the dose of your diabetic medicine. What side effects may I notice from receiving this medicine? Side effects that you should report to your doctor or health care professional as soon as possible: -allergic reactions like skin rash, itching or hives, swelling of the face, lips, or tongue -anxious -black, tarry stools -breathing problems -changes in vision -confusion -elevated mood, decreased need for sleep, racing thoughts, impulsive behavior -eye pain -fast, irregular heartbeat -feeling faint or lightheaded, falls -feeling agitated, angry, or irritable -hallucination, loss of contact with reality -loss of balance or coordination -loss of memory -painful or prolonged erections -restlessness, pacing, inability to keep still -seizures -stiff muscles -suicidal thoughts or other mood changes -trouble sleeping -unusual bleeding or bruising -unusually weak or tired -vomiting Side effects that usually do not require medical attention (report to your doctor or health care professional if they continue or are bothersome): -change in appetite or weight -change in sex drive or performance -diarrhea -dry mouth -headache -increased sweating -nausea -tremors This list may not describe all possible side effects. Call your doctor for medical advice about side effects. You may report side effects to FDA at 1-800-FDA-1088. Where should I keep my medicine? Keep out of the reach of children. Store at room temperature between 15 and 30 degrees C (59 and 86 degrees F). Throw away any unused medicine after the expiration date. NOTE: This sheet is a summary. It may not cover all possible information. If you have questions about this medicine, talk to your doctor, pharmacist, or health care provider.  2018 Elsevier/Gold Standard (2015-11-11 15:55:27)

## 2017-06-07 ENCOUNTER — Ambulatory Visit (INDEPENDENT_AMBULATORY_CARE_PROVIDER_SITE_OTHER): Payer: Medicaid Other | Admitting: Family Medicine

## 2017-06-07 ENCOUNTER — Encounter: Payer: Self-pay | Admitting: Family Medicine

## 2017-06-07 VITALS — BP 118/62 | HR 112 | Temp 100.2°F | Ht 68.0 in | Wt 159.3 lb

## 2017-06-07 DIAGNOSIS — J101 Influenza due to other identified influenza virus with other respiratory manifestations: Secondary | ICD-10-CM

## 2017-06-07 DIAGNOSIS — R6889 Other general symptoms and signs: Secondary | ICD-10-CM | POA: Diagnosis not present

## 2017-06-07 DIAGNOSIS — R000 Tachycardia, unspecified: Secondary | ICD-10-CM

## 2017-06-07 LAB — POCT INFLUENZA A/B
INFLUENZA A, POC: POSITIVE — AB
INFLUENZA B, POC: NEGATIVE

## 2017-06-07 MED ORDER — OSELTAMIVIR PHOSPHATE 75 MG PO CAPS: 75.0000 mg | ORAL_CAPSULE | Freq: Two times a day (BID) | ORAL | 0 refills | Status: AC

## 2017-06-07 NOTE — Progress Notes (Signed)
Name: Daniel Powell   MRN: [id number]    DOB: [birth year]   Date:06/07/2017       Progress Note  Subjective  Chief Complaint  Chief Complaint  Patient presents with  . Influenza  . URI  . Cough    Terrible cough and headache.   . Emesis    Several episodes of vomiting today     HPI Primary Language: Arabic, Interpreter utilized via telephone  Pt presents with concern for illness x1 day. He reports fatigue, cough, nausea, body aches, and feer for about 2 days. He was seen already today at the [facility] - Dr. Vivien Rossetti - states he was told he had high fever and heart rate, and was given Rx for Z-pack, Tessalon, and Tylenol, but he did not fill these medications yet.  He became nervous after being told that his heart rate was elevated and walked in to be seen by our clinic - denies chest pain or shortness of breath.   Patient Active Problem List   Diagnosis Date Noted  . HTN (hypertension), benign 04/03/2017  . Hypertriglyceridemia 07/27/2015  . Need for immunization against influenza 03/01/2015  . Diabetes mellitus type 2, uncontrolled ([place]) 03/01/2015  . Dyslipidemia 03/01/2015  . Back pain, thoracic 03/01/2015  . Persistent headaches 03/01/2015  . Folliculitis 40/98/1191  . Fracture of lumbar spine ([place]) 11/05/2013    Social History   Tobacco Use  . Smoking status: Never Smoker  . Smokeless tobacco: Never Used  Substance Use Topics  . Alcohol use: No    Alcohol/week: 0.0 oz     Current Outpatient Medications:  .  albuterol (PROVENTIL HFA;VENTOLIN HFA) 108 (90 Base) MCG/ACT inhaler, Inhale 2 puffs into the lungs every 6 (six) hours as needed., Disp: 1 Inhaler, Rfl: 0 .  atorvastatin (LIPITOR) 20 MG tablet, Take 1 tablet (20 mg total) by mouth daily at 6 PM. Take 1 tablet ( 20 mg total ) by mouth at bedtime., Disp: 90 tablet, Rfl: 0 .  benzonatate (TESSALON PERLES) 100 MG capsule, Take 1 capsule (100 mg total) by mouth every 6 (six) hours as  needed for cough., Disp: 15 capsule, Rfl: 0 .  cephALEXin (KEFLEX) 500 MG capsule, Take 1 capsule (500 mg total) by mouth 3 (three) times daily., Disp: 30 capsule, Rfl: 0 .  FLUoxetine (PROZAC) 20 MG capsule, Take 1 capsule (20 mg total) by mouth daily., Disp: 30 capsule, Rfl: 1 .  gabapentin (NEURONTIN) 300 MG capsule, Take 1 capsule (300 mg total) by mouth at bedtime., Disp: 90 capsule, Rfl: 0 .  glimepiride (AMARYL) 4 MG tablet, TAKE 1 TABLET BY MOUTH TWICE DAILY AFTER A MEAL, Disp: 180 tablet, Rfl: 0 .  Icosapent Ethyl (VASCEPA) 1 g CAPS, Take 2 capsules by mouth 2 (two) times daily after a meal., Disp: 360 capsule, Rfl: 0 .  losartan-hydrochlorothiazide (HYZAAR) 50-12.5 MG tablet, Take 1 tablet by mouth daily., Disp: 90 tablet, Rfl: 0 .  metFORMIN (GLUCOPHAGE) 1000 MG tablet, Take 1 tablet (1,000 mg total) by mouth 2 (two) times daily with a meal., Disp: 180 tablet, Rfl: 1 .  mupirocin ointment (BACTROBAN) 2 %, Apply 1 application topically 2 (two) times daily., Disp: 22 g, Rfl: 0 .  naproxen (NAPROSYN) 500 MG tablet, Take 500 mg by mouth as needed. , Disp: , Rfl:  .  omeprazole (PRILOSEC) 20 MG capsule, Take 1 capsule (20 mg total) by mouth daily., Disp: 30 capsule, Rfl: 0 .  sitaGLIPtin (JANUVIA) 100 MG tablet,  Take 1 tablet (100 mg total) by mouth daily., Disp: 90 tablet, Rfl: 0 .  tamsulosin (FLOMAX) 0.4 MG CAPS capsule, Take 0.4 mg by mouth at bedtime., Disp: , Rfl:  .  traZODone (DESYREL) 50 MG tablet, Take 0.5-1 tablets (25-50 mg total) by mouth at bedtime as needed for sleep., Disp: 30 tablet, Rfl: 1 .  metFORMIN (GLUCOPHAGE) 1000 MG tablet, Take 1 tablet (1,000 mg total) by mouth 2 (two) times daily with a meal., Disp: 180 tablet, Rfl: 0 .  oseltamivir (TAMIFLU) 75 MG capsule, Take 1 capsule (75 mg total) by mouth 2 (two) times daily for 5 days., Disp: 10 capsule, Rfl: 0  Allergies  Allergen Reactions  . Gadolinium Derivatives Itching    Pt began itching across ribs and back and  neck s/p Multihance injection    ROS  Ten systems reviewed and is negative except as mentioned in HPI  Objective  Vitals:   06/07/17 1227  BP: 118/62  Pulse: (!) 112  Temp: 100.2 F (37.9 C)  TempSrc: Oral  SpO2: 98%  Weight: 159 lb 4.8 oz (72.3 kg)  Height: 5\' 8"  (1.727 m)    Body mass index is 24.22 kg/m.  Nursing Note and Vital Signs reviewed.  Physical Exam Constitutional: Patient appears well-developed and well-nourished.  No distress.  HEENT: head atraumatic, normocephalic, pupils equal and reactive to light, EOM's intact, neck supple without lymphadenopathy, oropharynx erythematous and moist without exudate Cardiovascular: Tachycardic rate, regular rhythm, S1/S2 present.  No murmur or rub heard. No BLE edema. Pulmonary/Chest: Effort normal and breath sounds clear throughout. No respiratory distress or retractions. Abdominal: Soft and non-tender, bowel sounds present x4 quadrants. Psychiatric: Patient has a normal mood and affect. behavior is normal. Judgment and thought content normal.  Recent Results (from the past 2160 hour(s))  CBC with Differential/Platelet     Status: None   Collection Time: 04/02/17  3:38 PM  Result Value Ref Range   WBC 6.8 3.8 - 10.8 Thousand/uL   RBC 5.01 4.20 - 5.80 Million/uL   Hemoglobin 14.2 13.2 - 17.1 g/dL   HCT 41.4 38.5 - 50.0 %   MCV 82.6 80.0 - 100.0 fL   MCH 28.3 27.0 - 33.0 pg   MCHC 34.3 32.0 - 36.0 g/dL   RDW 13.0 11.0 - 15.0 %   Platelets 208 140 - 400 Thousand/uL   MPV 9.1 7.5 - 12.5 fL   Neutro Abs 4,461 1,500 - 7,800 cells/uL   Lymphs Abs 1,530 850 - 3,900 cells/uL   WBC mixed population 558 200 - 950 cells/uL   Eosinophils Absolute 211 15 - 500 cells/uL   Basophils Absolute 41 0 - 200 cells/uL   Neutrophils Relative % 65.6 %   Total Lymphocyte 22.5 %   Monocytes Relative 8.2 %   Eosinophils Relative 3.1 %   Basophils Relative 0.6 %  COMPLETE METABOLIC PANEL WITH GFR     Status: Abnormal   Collection Time:  04/02/17  3:38 PM  Result Value Ref Range   Glucose, Bld 243 (H) 65 - 139 mg/dL    Comment: .        Non-fasting reference interval .    BUN 20 7 - 25 mg/dL   Creat 1.11 0.70 - 1.33 mg/dL    Comment: For patients >25 years of age, the reference limit for Creatinine is approximately 13% higher for people identified as African-American. .    GFR, Est Non African American 72 > OR = 60 mL/min/1.72m2   GFR,  Est African American 84 > OR = 60 mL/min/1.63m2   BUN/Creatinine Ratio NOT APPLICABLE 6 - 22 (calc)   Sodium 136 135 - 146 mmol/L   Potassium 4.1 3.5 - 5.3 mmol/L   Chloride 96 (L) 98 - 110 mmol/L   CO2 28 20 - 32 mmol/L   Calcium 9.6 8.6 - 10.3 mg/dL   Total Protein 7.6 6.1 - 8.1 g/dL   Albumin 4.5 3.6 - 5.1 g/dL   Globulin 3.1 1.9 - 3.7 g/dL (calc)   AG Ratio 1.5 1.0 - 2.5 (calc)   Total Bilirubin 1.1 0.2 - 1.2 mg/dL   Alkaline phosphatase (APISO) 64 40 - 115 U/L   AST 17 10 - 35 U/L   ALT 17 9 - 46 U/L  POCT Glucose (CBG)     Status: Abnormal   Collection Time: 04/03/17 11:03 AM  Result Value Ref Range   POC Glucose 293 (A) 70 - 99 mg/dl  POCT glycosylated hemoglobin (Hb A1C)     Status: None   Collection Time: 04/04/17  3:14 PM  Result Value Ref Range   Hemoglobin A1C 8.4   Lipid panel     Status: Abnormal   Collection Time: 04/10/17 12:03 PM  Result Value Ref Range   Cholesterol 171 <200 mg/dL   HDL 31 (L) >40 mg/dL   Triglycerides 402 (H) <150 mg/dL   LDL Cholesterol (Calc)  mg/dL (calc)    Comment: . LDL cholesterol not calculated. Triglyceride levels greater than 400 mg/dL invalidate calculated LDL results. . Reference range: <100 . Desirable range <100 mg/dL for primary prevention;   <70 mg/dL for patients with CHD or diabetic patients  with > or = 2 CHD risk factors. Marland Kitchen LDL-C is now calculated using the Martin-Hopkins  calculation, which is a validated novel method providing  better accuracy than the Friedewald equation in the  estimation of LDL-C.   Cresenciano Genre et al. Annamaria Helling. 0932;355(73): 2061-2068  (http://education.[employer].com/faq/FAQ164)    Total CHOL/HDL Ratio 5.5 (H) <5.0 (calc)   Non-HDL Cholesterol (Calc) 140 (H) <130 mg/dL (calc)    Comment: For patients with diabetes plus 1 major ASCVD risk  factor, treating to a non-HDL-C goal of <100 mg/dL  (LDL-C of <70 mg/dL) is considered a therapeutic  option.   POCT Influenza A/B     Status: Abnormal   Collection Time: 06/07/17  1:07 PM  Result Value Ref Range   Influenza A, POC Positive (A) Negative   Influenza B, POC Negative Negative     Assessment & Plan  1. Influenza A - oseltamivir (TAMIFLU) 75 MG capsule; Take 1 capsule (75 mg total) by mouth 2 (two) times daily for 5 days.  Dispense: 10 capsule; Refill: 0 - Declines work note - Drink plenty of fluids, take medications as prescribed including tylenol and tessalon, and get plenty of rest, good hand hygeine all discussed via interpreter.  2. Flu-like symptoms - POCT Influenza A/B - positive for type A - May use tessalon already prescribed to him for cough PRN.  3. Tachycardia with heart rate 100-120 beats per minute Reassurance provided that heart rate auscultated is regular rhythm and 110bpm - this is common when febrile with illness, and that he must increase hydration, take tylenol to control fever.  -Red flags and when to present for emergency care or RTC including fever >101.6F uncontrolled with tylenol, chest pain, shortness of breath, new/worsening/un-resolving symptoms, reviewed with patient at time of visit. Follow up and care instructions discussed and provided in AVS. -  Return if symptoms worsen or fail to improve, for 3-5 days.

## 2017-06-07 NOTE — Patient Instructions (Signed)
Please drink plenty of fluids, take your medications as prescribed, including the Tamiflu.  Get plenty of rest.

## 2017-06-19 ENCOUNTER — Ambulatory Visit: Payer: Self-pay | Admitting: Family Medicine

## 2017-06-25 ENCOUNTER — Ambulatory Visit: Payer: Medicaid Other | Admitting: Family Medicine

## 2017-06-25 VITALS — BP 118/68 | HR 105 | Temp 98.2°F | Resp 16 | Wt 154.2 lb

## 2017-06-25 DIAGNOSIS — R05 Cough: Secondary | ICD-10-CM | POA: Diagnosis not present

## 2017-06-25 DIAGNOSIS — R058 Other specified cough: Secondary | ICD-10-CM

## 2017-06-25 MED ORDER — PREDNISONE 10 MG (21) PO TBPK: ORAL_TABLET | ORAL | 0 refills | Status: DC

## 2017-06-25 MED ORDER — BENZONATATE 100 MG PO CAPS: 100.0000 mg | ORAL_CAPSULE | Freq: Three times a day (TID) | ORAL | 0 refills | Status: DC | PRN

## 2017-06-25 MED ORDER — OMEPRAZOLE 20 MG PO CPDR: 20.0000 mg | DELAYED_RELEASE_CAPSULE | Freq: Every day | ORAL | 0 refills | Status: DC

## 2017-06-25 NOTE — Progress Notes (Signed)
Name: Daniel Powell   MRN: [id number]    DOB: [birth year]   Date:06/25/2017       Progress Note  Subjective  Chief Complaint  Chief Complaint  Patient presents with  . Cough    Chest congestion, head pressure in the middle of the patient head at times and lingering cough for 2-3 weeks. Pt denies any improvment even after given an antibotic last month .    Cough  This is a new problem. The current episode started 1 to 4 weeks ago (3 weeks ago). The problem has been unchanged. The cough is productive of sputum. Associated symptoms include sweats (mainly night sweats. ). Pertinent negatives include no chills, ear pain, fever, nasal congestion, postnasal drip or shortness of breath. He has tried prescription cough suppressant (has taken Zithromax and Oseltamivir for treatment of Influenza. ) for the symptoms.      Past Medical History:  Diagnosis Date  . Diabetes mellitus without complication [facility])     Past Surgical History:  Procedure Laterality Date  . BACK SURGERY  2014    Family History  Problem Relation Age of Onset  . Heart attack Mother   . Diabetes Mother   . Hypertension Mother   . Heart disease Father   . Diabetes Father   . Hypertension Father   . Diabetes Sister   . Hypertension Sister     Social History   Socioeconomic History  . Marital status: Legally Separated    Spouse name: Not on file  . Number of children: Not on file  . Years of education: Not on file  . Highest education level: Not on file  Social Needs  . Financial resource strain: Not on file  . Food insecurity - worry: Not on file  . Food insecurity - inability: Not on file  . Transportation needs - medical: Not on file  . Transportation needs - non-medical: Not on file  Occupational History  . Not on file  Tobacco Use  . Smoking status: Never Smoker  . Smokeless tobacco: Never Used  Substance and Sexual Activity  . Alcohol use: No    Alcohol/week: 0.0 oz  . Drug use: No  . Sexual  activity: Not on file  Other Topics Concern  . Not on file  Social History Narrative   Lives at home with son.  Does not work.  Has significant other.       Current Outpatient Medications:  .  albuterol (PROVENTIL HFA;VENTOLIN HFA) 108 (90 Base) MCG/ACT inhaler, Inhale 2 puffs into the lungs every 6 (six) hours as needed., Disp: 1 Inhaler, Rfl: 0 .  atorvastatin (LIPITOR) 20 MG tablet, Take 1 tablet (20 mg total) by mouth daily at 6 PM. Take 1 tablet ( 20 mg total ) by mouth at bedtime., Disp: 90 tablet, Rfl: 0 .  FLUoxetine (PROZAC) 20 MG capsule, Take 1 capsule (20 mg total) by mouth daily., Disp: 30 capsule, Rfl: 1 .  gabapentin (NEURONTIN) 300 MG capsule, Take 1 capsule (300 mg total) by mouth at bedtime., Disp: 90 capsule, Rfl: 0 .  glimepiride (AMARYL) 4 MG tablet, TAKE 1 TABLET BY MOUTH TWICE DAILY AFTER A MEAL, Disp: 180 tablet, Rfl: 0 .  Icosapent Ethyl (VASCEPA) 1 g CAPS, Take 2 capsules by mouth 2 (two) times daily after a meal., Disp: 360 capsule, Rfl: 0 .  losartan-hydrochlorothiazide (HYZAAR) 50-12.5 MG tablet, Take 1 tablet by mouth daily., Disp: 90 tablet, Rfl: 0 .  metFORMIN (GLUCOPHAGE) 1000 MG tablet,  Take 1 tablet (1,000 mg total) by mouth 2 (two) times daily with a meal., Disp: 180 tablet, Rfl: 1 .  omeprazole (PRILOSEC) 20 MG capsule, Take 1 capsule (20 mg total) by mouth daily., Disp: 30 capsule, Rfl: 0 .  sitaGLIPtin (JANUVIA) 100 MG tablet, Take 1 tablet (100 mg total) by mouth daily., Disp: 90 tablet, Rfl: 0 .  tamsulosin (FLOMAX) 0.4 MG CAPS capsule, Take 0.4 mg by mouth at bedtime., Disp: , Rfl:  .  benzonatate (TESSALON PERLES) 100 MG capsule, Take 1 capsule (100 mg total) by mouth every 6 (six) hours as needed for cough. (Patient not taking: Reported on 06/25/2017), Disp: 15 capsule, Rfl: 0 .  cephALEXin (KEFLEX) 500 MG capsule, Take 1 capsule (500 mg total) by mouth 3 (three) times daily. (Patient not taking: Reported on 06/25/2017), Disp: 30 capsule, Rfl: 0 .   metFORMIN (GLUCOPHAGE) 1000 MG tablet, Take 1 tablet (1,000 mg total) by mouth 2 (two) times daily with a meal. (Patient not taking: Reported on 06/25/2017), Disp: 180 tablet, Rfl: 0 .  mupirocin ointment (BACTROBAN) 2 %, Apply 1 application topically 2 (two) times daily. (Patient not taking: Reported on 06/25/2017), Disp: 22 g, Rfl: 0 .  naproxen (NAPROSYN) 500 MG tablet, Take 500 mg by mouth as needed. , Disp: , Rfl:  .  traZODone (DESYREL) 50 MG tablet, Take 0.5-1 tablets (25-50 mg total) by mouth at bedtime as needed for sleep. (Patient not taking: Reported on 06/25/2017), Disp: 30 tablet, Rfl: 1  Allergies  Allergen Reactions  . Gadolinium Derivatives Itching    Pt began itching across ribs and back and neck s/p Multihance injection     Review of Systems  Constitutional: Negative for chills and fever.  HENT: Negative for ear pain and postnasal drip.   Respiratory: Positive for cough. Negative for shortness of breath.     Objective  Vitals:   06/25/17 1505  BP: 118/68  Pulse: (!) 105  Resp: 16  Temp: 98.2 F (36.8 C)  TempSrc: Oral  SpO2: 96%  Weight: 154 lb 3.2 oz (69.9 kg)    Physical Exam  Constitutional: He is well-developed, well-nourished, and in no distress.  HENT:  Unable to examine because of strong gag reflex.  Cardiovascular: Regular rhythm, S1 normal, S2 normal and normal heart sounds. Tachycardia present.  No murmur heard. Pulmonary/Chest: Effort normal. No respiratory distress. He has no decreased breath sounds. He has no wheezes.  Nursing note and vitals reviewed.      Recent Results (from the past 2160 hour(s))  CBC with Differential/Platelet     Status: None   Collection Time: 04/02/17  3:38 PM  Result Value Ref Range   WBC 6.8 3.8 - 10.8 Thousand/uL   RBC 5.01 4.20 - 5.80 Million/uL   Hemoglobin 14.2 13.2 - 17.1 g/dL   HCT 41.4 38.5 - 50.0 %   MCV 82.6 80.0 - 100.0 fL   MCH 28.3 27.0 - 33.0 pg   MCHC 34.3 32.0 - 36.0 g/dL   RDW 13.0 11.0 - 15.0  %   Platelets 208 140 - 400 Thousand/uL   MPV 9.1 7.5 - 12.5 fL   Neutro Abs 4,461 1,500 - 7,800 cells/uL   Lymphs Abs 1,530 850 - 3,900 cells/uL   WBC mixed population 558 200 - 950 cells/uL   Eosinophils Absolute 211 15 - 500 cells/uL   Basophils Absolute 41 0 - 200 cells/uL   Neutrophils Relative % 65.6 %   Total Lymphocyte 22.5 %  Monocytes Relative 8.2 %   Eosinophils Relative 3.1 %   Basophils Relative 0.6 %  COMPLETE METABOLIC PANEL WITH GFR     Status: Abnormal   Collection Time: 04/02/17  3:38 PM  Result Value Ref Range   Glucose, Bld 243 (H) 65 - 139 mg/dL    Comment: .        Non-fasting reference interval .    BUN 20 7 - 25 mg/dL   Creat 1.11 0.70 - 1.33 mg/dL    Comment: For patients >56 years of age, the reference limit for Creatinine is approximately 13% higher for people identified as African-American. .    GFR, Est Non African American 72 > OR = 60 mL/min/1.62m2   GFR, Est African American 84 > OR = 60 mL/min/1.80m2   BUN/Creatinine Ratio NOT APPLICABLE 6 - 22 (calc)   Sodium 136 135 - 146 mmol/L   Potassium 4.1 3.5 - 5.3 mmol/L   Chloride 96 (L) 98 - 110 mmol/L   CO2 28 20 - 32 mmol/L   Calcium 9.6 8.6 - 10.3 mg/dL   Total Protein 7.6 6.1 - 8.1 g/dL   Albumin 4.5 3.6 - 5.1 g/dL   Globulin 3.1 1.9 - 3.7 g/dL (calc)   AG Ratio 1.5 1.0 - 2.5 (calc)   Total Bilirubin 1.1 0.2 - 1.2 mg/dL   Alkaline phosphatase (APISO) 64 40 - 115 U/L   AST 17 10 - 35 U/L   ALT 17 9 - 46 U/L  POCT Glucose (CBG)     Status: Abnormal   Collection Time: 04/03/17 11:03 AM  Result Value Ref Range   POC Glucose 293 (A) 70 - 99 mg/dl  POCT glycosylated hemoglobin (Hb A1C)     Status: None   Collection Time: 04/04/17  3:14 PM  Result Value Ref Range   Hemoglobin A1C 8.4   Lipid panel     Status: Abnormal   Collection Time: 04/10/17 12:03 PM  Result Value Ref Range   Cholesterol 171 <200 mg/dL   HDL 31 (L) >40 mg/dL   Triglycerides 402 (H) <150 mg/dL   LDL Cholesterol  (Calc)  mg/dL (calc)    Comment: . LDL cholesterol not calculated. Triglyceride levels greater than 400 mg/dL invalidate calculated LDL results. . Reference range: <100 . Desirable range <100 mg/dL for primary prevention;   <70 mg/dL for patients with CHD or diabetic patients  with > or = 2 CHD risk factors. Marland Kitchen LDL-C is now calculated using the Martin-Hopkins  calculation, which is a validated novel method providing  better accuracy than the Friedewald equation in the  estimation of LDL-C.  Cresenciano Genre et al. Annamaria Helling. 7322;025(42): 2061-2068  (http://education.[employer].com/faq/FAQ164)    Total CHOL/HDL Ratio 5.5 (H) <5.0 (calc)   Non-HDL Cholesterol (Calc) 140 (H) <130 mg/dL (calc)    Comment: For patients with diabetes plus 1 major ASCVD risk  factor, treating to a non-HDL-C goal of <100 mg/dL  (LDL-C of <70 mg/dL) is considered a therapeutic  option.   POCT Influenza A/B     Status: Abnormal   Collection Time: 06/07/17  1:07 PM  Result Value Ref Range   Influenza A, POC Positive (A) Negative   Influenza B, POC Negative Negative     Assessment & Plan  1. Productive cough Patient's symptoms and exam is concerning for bronchitis versus post viral pneumonia, we'll start on tapering dose of prednisone and benzonatate. Obtain chest x-ray for evaluation. Advised patient that by taking prednisone, his sugar can go  up and that he should monitor his blood sugar frequently and contact us if it goes above 200 mg/dL, continue taking omeprazole for possible reflux as an earlier episode of cough. To be reflux mediated - benzonatate (TESSALON) 100 MG capsule; Take 1 capsule (100 mg total) by mouth 3 (three) times daily as needed for cough.  Dispense: 20 capsule; Refill: 0 - predniSONE (STERAPRED UNI-PAK 21 TAB) 10 MG (21) TBPK tablet; 60 50 40 30 20 10  then STOP  Dispense: 21 tablet; Refill: 0 - DG Chest 2 View; Future - omeprazole (PRILOSEC) 20 MG capsule; Take 1 capsule (20 mg total) by  mouth daily.  Dispense: 30 capsule; Refill: 0  Interpreter ID #[id number] Okey [facility]re [facility] 06/25/2017 3:21 PM

## 2017-06-26 ENCOUNTER — Ambulatory Visit
Admission: RE | Admit: 2017-06-26 | Discharge: 2017-06-26 | Disposition: A | Discharge status: Home / Self Care | Payer: Medicaid Other | Source: Ambulatory Visit | Attending: Family Medicine | Admitting: Family Medicine

## 2017-06-26 DIAGNOSIS — R058 Other specified cough: Secondary | ICD-10-CM

## 2017-06-26 DIAGNOSIS — R05 Cough: Secondary | ICD-10-CM

## 2017-07-02 ENCOUNTER — Encounter: Payer: Self-pay | Admitting: Family Medicine

## 2017-07-02 ENCOUNTER — Ambulatory Visit: Payer: Medicaid Other | Admitting: Family Medicine

## 2017-07-02 VITALS — BP 126/62 | HR 93 | Temp 97.9°F | Resp 16 | Wt 155.9 lb

## 2017-07-02 DIAGNOSIS — J208 Acute bronchitis due to other specified organisms: Secondary | ICD-10-CM

## 2017-07-02 DIAGNOSIS — Z0289 Encounter for other administrative examinations: Secondary | ICD-10-CM

## 2017-07-02 NOTE — Progress Notes (Signed)
Name: Daniel Powell   MRN: [id number]    DOB: 1956/04/27   Date:07/02/2017       Progress Note  Subjective  Chief Complaint  Chief Complaint  Patient presents with  . Follow-up    on cough and congestion    HPI  Pt. Presents for follow up of cough and congestion, he is feeling better but experiencing chest congestion, he denies any fevers, chills, or coughing. He has ben taking Prilosec 20 mg, has finished taking Tessalon and Prednisone. CXR was unremarkable, was treated for flu last month.     Past Medical History:  Diagnosis Date  . Diabetes mellitus without complication [facility])     Past Surgical History:  Procedure Laterality Date  . BACK SURGERY  2014    Family History  Problem Relation Age of Onset  . Heart attack Mother   . Diabetes Mother   . Hypertension Mother   . Heart disease Father   . Diabetes Father   . Hypertension Father   . Diabetes Sister   . Hypertension Sister     Social History   Socioeconomic History  . Marital status: Legally Separated    Spouse name: Not on file  . Number of children: Not on file  . Years of education: Not on file  . Highest education level: Not on file  Social Needs  . Financial resource strain: Not on file  . Food insecurity - worry: Not on file  . Food insecurity - inability: Not on file  . Transportation needs - medical: Not on file  . Transportation needs - non-medical: Not on file  Occupational History  . Not on file  Tobacco Use  . Smoking status: Never Smoker  . Smokeless tobacco: Never Used  Substance and Sexual Activity  . Alcohol use: No    Alcohol/week: 0.0 oz  . Drug use: No  . Sexual activity: Not on file  Other Topics Concern  . Not on file  Social History Narrative   Lives at home with son.  Does not work.  Has significant other.       Current Outpatient Medications:  .  atorvastatin (LIPITOR) 20 MG tablet, Take 1 tablet (20 mg total) by mouth daily at 6 PM. Take 1 tablet ( 20 mg total ) by  mouth at bedtime., Disp: 90 tablet, Rfl: 0 .  benzonatate (TESSALON PERLES) 100 MG capsule, Take 1 capsule (100 mg total) by mouth every 6 (six) hours as needed for cough., Disp: 15 capsule, Rfl: 0 .  gabapentin (NEURONTIN) 300 MG capsule, Take 1 capsule (300 mg total) by mouth at bedtime., Disp: 90 capsule, Rfl: 0 .  glimepiride (AMARYL) 4 MG tablet, TAKE 1 TABLET BY MOUTH TWICE DAILY AFTER A MEAL, Disp: 180 tablet, Rfl: 0 .  Icosapent Ethyl (VASCEPA) 1 g CAPS, Take 2 capsules by mouth 2 (two) times daily after a meal., Disp: 360 capsule, Rfl: 0 .  losartan-hydrochlorothiazide (HYZAAR) 50-12.5 MG tablet, Take 1 tablet by mouth daily., Disp: 90 tablet, Rfl: 0 .  metFORMIN (GLUCOPHAGE) 1000 MG tablet, Take 1 tablet (1,000 mg total) by mouth 2 (two) times daily with a meal., Disp: 180 tablet, Rfl: 1 .  sitaGLIPtin (JANUVIA) 100 MG tablet, Take 1 tablet (100 mg total) by mouth daily., Disp: 90 tablet, Rfl: 0 .  albuterol (PROVENTIL HFA;VENTOLIN HFA) 108 (90 Base) MCG/ACT inhaler, Inhale 2 puffs into the lungs every 6 (six) hours as needed. (Patient not taking: Reported on 07/02/2017), Disp: 1 Inhaler,  Rfl: 0 .  benzonatate (TESSALON) 100 MG capsule, Take 1 capsule (100 mg total) by mouth 3 (three) times daily as needed for cough. (Patient not taking: Reported on 07/02/2017), Disp: 20 capsule, Rfl: 0 .  cephALEXin (KEFLEX) 500 MG capsule, Take 1 capsule (500 mg total) by mouth 3 (three) times daily. (Patient not taking: Reported on 06/25/2017), Disp: 30 capsule, Rfl: 0 .  FLUoxetine (PROZAC) 20 MG capsule, Take 1 capsule (20 mg total) by mouth daily. (Patient not taking: Reported on 07/02/2017), Disp: 30 capsule, Rfl: 1 .  metFORMIN (GLUCOPHAGE) 1000 MG tablet, Take 1 tablet (1,000 mg total) by mouth 2 (two) times daily with a meal. (Patient not taking: Reported on 06/25/2017), Disp: 180 tablet, Rfl: 0 .  mupirocin ointment (BACTROBAN) 2 %, Apply 1 application topically 2 (two) times daily. (Patient not taking:  Reported on 06/25/2017), Disp: 22 g, Rfl: 0 .  naproxen (NAPROSYN) 500 MG tablet, Take 500 mg by mouth as needed. , Disp: , Rfl:  .  omeprazole (PRILOSEC) 20 MG capsule, Take 1 capsule (20 mg total) by mouth daily. (Patient not taking: Reported on 07/02/2017), Disp: 30 capsule, Rfl: 0 .  omeprazole (PRILOSEC) 20 MG capsule, Take 1 capsule (20 mg total) by mouth daily. (Patient not taking: Reported on 07/02/2017), Disp: 30 capsule, Rfl: 0 .  predniSONE (STERAPRED UNI-PAK 21 TAB) 10 MG (21) TBPK tablet, 60 50 40 30 20 10  then STOP (Patient not taking: Reported on 07/02/2017), Disp: 21 tablet, Rfl: 0 .  tamsulosin (FLOMAX) 0.4 MG CAPS capsule, Take 0.4 mg by mouth at bedtime., Disp: , Rfl:  .  traZODone (DESYREL) 50 MG tablet, Take 0.5-1 tablets (25-50 mg total) by mouth at bedtime as needed for sleep. (Patient not taking: Reported on 06/25/2017), Disp: 30 tablet, Rfl: 1  Allergies  Allergen Reactions  . Gadolinium Derivatives Itching    Pt began itching across ribs and back and neck s/p Multihance injection     ROS  Please see history of present illness for complete discussion of ROS  Objective  Vitals:   07/02/17 1354  BP: 126/62  Pulse: 93  Resp: 16  Temp: 97.9 F (36.6 C)  TempSrc: Oral  SpO2: 98%  Weight: 155 lb 14.4 oz (70.7 kg)    Physical Exam  Constitutional: He is oriented to person, place, and time and well-developed, well-nourished, and in no distress.  HENT:  Head: Normocephalic and atraumatic.  Cardiovascular: Normal rate, regular rhythm and normal heart sounds.  No murmur heard. Pulmonary/Chest: Effort normal and breath sounds normal. No respiratory distress. He has no wheezes. He has no rales.  Neurological: He is alert and oriented to person, place, and time.  Nursing note and vitals reviewed.       Assessment & Plan  1. Viral bronchitis Resolving, reassured, no further intervention needed  2. History and physical examination, immigration Patient is  requesting me to fill out the form for [place] [employer] to claim exception from testing required by the immigration service for citizenship, I have reviewed the form and have recommended that he should speak with the neurologist and/or the psychiatrist if he is claiming memory impairment or depression as the basis for seeking an exemption from testing, patient verbalized agreement  Interpreter ID #: [id number]  [place] 07/02/2017 2:01 PM

## 2017-07-09 ENCOUNTER — Encounter: Payer: Self-pay | Admitting: Family Medicine

## 2017-07-09 ENCOUNTER — Ambulatory Visit: Payer: Medicaid Other | Admitting: Family Medicine

## 2017-07-09 VITALS — BP 116/56 | HR 83 | Temp 98.2°F | Resp 14 | Wt 152.0 lb

## 2017-07-09 DIAGNOSIS — I10 Essential (primary) hypertension: Secondary | ICD-10-CM | POA: Diagnosis not present

## 2017-07-09 DIAGNOSIS — R319 Hematuria, unspecified: Secondary | ICD-10-CM | POA: Diagnosis not present

## 2017-07-09 DIAGNOSIS — Z0289 Encounter for other administrative examinations: Secondary | ICD-10-CM

## 2017-07-09 DIAGNOSIS — E1165 Type 2 diabetes mellitus with hyperglycemia: Secondary | ICD-10-CM | POA: Diagnosis not present

## 2017-07-09 DIAGNOSIS — E781 Pure hyperglyceridemia: Secondary | ICD-10-CM | POA: Diagnosis not present

## 2017-07-09 LAB — POCT GLYCOSYLATED HEMOGLOBIN (HGB A1C): Hemoglobin A1C: 8.2

## 2017-07-09 LAB — GLUCOSE, POCT (MANUAL RESULT ENTRY): POC GLUCOSE: 155 mg/dL — AB (ref 70–99)

## 2017-07-09 MED ORDER — SITAGLIPTIN PHOSPHATE 100 MG PO TABS: 100.0000 mg | ORAL_TABLET | Freq: Every day | ORAL | 0 refills | Status: DC

## 2017-07-09 MED ORDER — ATORVASTATIN CALCIUM 20 MG PO TABS: 20.0000 mg | ORAL_TABLET | Freq: Every day | ORAL | 1 refills | Status: DC

## 2017-07-09 MED ORDER — METFORMIN HCL 1000 MG PO TABS: 1000.0000 mg | ORAL_TABLET | Freq: Two times a day (BID) | ORAL | 1 refills | Status: DC

## 2017-07-09 MED ORDER — ICOSAPENT ETHYL 1 G PO CAPS
2.0000 | ORAL_CAPSULE | Freq: Two times a day (BID) | ORAL | 1 refills | Status: DC
Start: 2017-07-09 — End: 2018-01-20

## 2017-07-09 MED ORDER — GLIMEPIRIDE 4 MG PO TABS: ORAL_TABLET | ORAL | 1 refills | Status: DC

## 2017-07-09 MED ORDER — GABAPENTIN 300 MG PO CAPS: 300.0000 mg | ORAL_CAPSULE | Freq: Every day | ORAL | 1 refills | Status: DC

## 2017-07-09 MED ORDER — LOSARTAN POTASSIUM-HCTZ 50-12.5 MG PO TABS: 1.0000 | ORAL_TABLET | Freq: Every day | ORAL | 1 refills | Status: DC

## 2017-07-09 NOTE — Progress Notes (Signed)
Name: Daniel Powell   MRN: [id number]    DOB: 08-31-1955   Date:07/09/2017       Progress Note  Subjective  Chief Complaint  Chief Complaint  Patient presents with  . Follow-up    Diabetes  He presents for his follow-up diabetic visit. He has type 2 diabetes mellitus. His disease course has been improving. Pertinent negatives for hypoglycemia include no dizziness, headaches, nervousness/anxiousness or sweats. Associated symptoms include polydipsia and polyuria (he has noticed blood in urine at times.). Pertinent negatives for diabetes include no blurred vision, no chest pain, no fatigue and no foot paresthesias. There are no diabetic complications. Pertinent negatives for diabetic complications include no CVA, heart disease or peripheral neuropathy. Current diabetic treatment includes oral agent (triple therapy). He is compliant with treatment all of the time. His weight is stable. He is following a generally healthy diet. He rarely participates in exercise. He monitors blood glucose at home 3-4 x per week. His breakfast blood glucose range is generally 180-200 mg/dl. An ACE inhibitor/angiotensin II receptor blocker is being taken. Eye exam is current.  Hyperlipidemia  This is a chronic problem. The problem is uncontrolled. Recent lipid tests were reviewed and are high (elevated triglycerides). Exacerbating diseases include diabetes. Pertinent negatives include no chest pain, leg pain, myalgias or shortness of breath. Current antihyperlipidemic treatment includes statins (He is on Vascepa 2g BID). Risk factors for coronary artery disease include diabetes mellitus, dyslipidemia, male sex and stress.  Hypertension  This is a chronic problem. The problem is unchanged. The problem is controlled. Pertinent negatives include no blurred vision, chest pain, headaches, palpitations, shortness of breath or sweats. Past treatments include angiotensin blockers and diuretics. There is no history of kidney disease,  CAD/MI or CVA.     Past Medical History:  Diagnosis Date  . Diabetes mellitus without complication [facility])     Past Surgical History:  Procedure Laterality Date  . BACK SURGERY  2014    Family History  Problem Relation Age of Onset  . Heart attack Mother   . Diabetes Mother   . Hypertension Mother   . Heart disease Father   . Diabetes Father   . Hypertension Father   . Diabetes Sister   . Hypertension Sister     Social History   Socioeconomic History  . Marital status: Legally Separated    Spouse name: Not on file  . Number of children: Not on file  . Years of education: Not on file  . Highest education level: Not on file  Social Needs  . Financial resource strain: Not on file  . Food insecurity - worry: Not on file  . Food insecurity - inability: Not on file  . Transportation needs - medical: Not on file  . Transportation needs - non-medical: Not on file  Occupational History  . Not on file  Tobacco Use  . Smoking status: Never Smoker  . Smokeless tobacco: Never Used  Substance and Sexual Activity  . Alcohol use: No    Alcohol/week: 0.0 oz  . Drug use: No  . Sexual activity: Not on file  Other Topics Concern  . Not on file  Social History Narrative   Lives at home with son.  Does not work.  Has significant other.       Current Outpatient Medications:  .  atorvastatin (LIPITOR) 20 MG tablet, Take 1 tablet (20 mg total) by mouth daily at 6 PM. Take 1 tablet ( 20 mg total ) by mouth  at bedtime., Disp: 90 tablet, Rfl: 0 .  benzonatate (TESSALON PERLES) 100 MG capsule, Take 1 capsule (100 mg total) by mouth every 6 (six) hours as needed for cough., Disp: 15 capsule, Rfl: 0 .  gabapentin (NEURONTIN) 300 MG capsule, Take 1 capsule (300 mg total) by mouth at bedtime., Disp: 90 capsule, Rfl: 0 .  glimepiride (AMARYL) 4 MG tablet, TAKE 1 TABLET BY MOUTH TWICE DAILY AFTER A MEAL, Disp: 180 tablet, Rfl: 0 .  Icosapent Ethyl (VASCEPA) 1 g CAPS, Take 2 capsules by mouth  2 (two) times daily after a meal., Disp: 360 capsule, Rfl: 0 .  losartan-hydrochlorothiazide (HYZAAR) 50-12.5 MG tablet, Take 1 tablet by mouth daily., Disp: 90 tablet, Rfl: 0 .  metFORMIN (GLUCOPHAGE) 1000 MG tablet, Take 1 tablet (1,000 mg total) by mouth 2 (two) times daily with a meal., Disp: 180 tablet, Rfl: 0 .  metFORMIN (GLUCOPHAGE) 1000 MG tablet, Take 1 tablet (1,000 mg total) by mouth 2 (two) times daily with a meal., Disp: 180 tablet, Rfl: 1 .  mupirocin ointment (BACTROBAN) 2 %, Apply 1 application topically 2 (two) times daily., Disp: 22 g, Rfl: 0 .  naproxen (NAPROSYN) 500 MG tablet, Take 500 mg by mouth as needed. , Disp: , Rfl:  .  omeprazole (PRILOSEC) 20 MG capsule, Take 1 capsule (20 mg total) by mouth daily., Disp: 30 capsule, Rfl: 0 .  omeprazole (PRILOSEC) 20 MG capsule, Take 1 capsule (20 mg total) by mouth daily., Disp: 30 capsule, Rfl: 0 .  predniSONE (STERAPRED UNI-PAK 21 TAB) 10 MG (21) TBPK tablet, 60 50 40 30 20 10  then STOP, Disp: 21 tablet, Rfl: 0 .  sitaGLIPtin (JANUVIA) 100 MG tablet, Take 1 tablet (100 mg total) by mouth daily., Disp: 90 tablet, Rfl: 0 .  tamsulosin (FLOMAX) 0.4 MG CAPS capsule, Take 0.4 mg by mouth at bedtime., Disp: , Rfl:  .  traZODone (DESYREL) 50 MG tablet, Take 0.5-1 tablets (25-50 mg total) by mouth at bedtime as needed for sleep., Disp: 30 tablet, Rfl: 1 .  albuterol (PROVENTIL HFA;VENTOLIN HFA) 108 (90 Base) MCG/ACT inhaler, Inhale 2 puffs into the lungs every 6 (six) hours as needed. (Patient not taking: Reported on 07/02/2017), Disp: 1 Inhaler, Rfl: 0 .  cephALEXin (KEFLEX) 500 MG capsule, Take 1 capsule (500 mg total) by mouth 3 (three) times daily. (Patient not taking: Reported on 06/25/2017), Disp: 30 capsule, Rfl: 0 .  FLUoxetine (PROZAC) 20 MG capsule, Take 1 capsule (20 mg total) by mouth daily. (Patient not taking: Reported on 07/02/2017), Disp: 30 capsule, Rfl: 1  Allergies  Allergen Reactions  . Gadolinium Derivatives Itching    Pt  began itching across ribs and back and neck s/p Multihance injection     Review of Systems  Constitutional: Negative for fatigue.  Eyes: Negative for blurred vision.  Respiratory: Negative for shortness of breath.   Cardiovascular: Negative for chest pain and palpitations.  Musculoskeletal: Negative for myalgias.  Neurological: Negative for dizziness and headaches.  Endo/Heme/Allergies: Positive for polydipsia.  Psychiatric/Behavioral: The patient is not nervous/anxious.     Objective  Vitals:   07/09/17 1327  BP: (!) 116/56  Pulse: 83  Resp: 14  Temp: 98.2 F (36.8 C)  TempSrc: Oral  SpO2: 97%  Weight: 152 lb (68.9 kg)    Physical Exam  Constitutional: He is oriented to person, place, and time and well-developed, well-nourished, and in no distress.  HENT:  Head: Normocephalic and atraumatic.  Cardiovascular: Normal rate, regular rhythm and  normal heart sounds.  No murmur heard. Pulmonary/Chest: Effort normal and breath sounds normal. He has no wheezes.  Musculoskeletal: He exhibits no edema.  Neurological: He is alert and oriented to person, place, and time.  Psychiatric: Mood, memory, affect and judgment normal.  Nursing note and vitals reviewed.    Recent Results (from the past 2160 hour(s))  POCT Influenza A/B     Status: Abnormal   Collection Time: 06/07/17  1:07 PM  Result Value Ref Range   Influenza A, POC Positive (A) Negative   Influenza B, POC Negative Negative  POCT Glucose (CBG)     Status: Abnormal   Collection Time: 07/09/17  1:42 PM  Result Value Ref Range   POC Glucose 155 (A) 70 - 99 mg/dl     Assessment & Plan  1. Uncontrolled type 2 diabetes mellitus with hyperglycemia (HCC) POC A1c is 8.2%, which is improved from 8.4% from 3 months ago, continue on glimepiride, metformin and Januvia, referral to ophthalmology for diabetic eye exam - POCT HgB A1C - POCT Glucose (CBG) - gabapentin (NEURONTIN) 300 MG capsule; Take 1 capsule (300 mg total)  by mouth at bedtime.  Dispense: 90 capsule; Refill: 1 - glimepiride (AMARYL) 4 MG tablet; TAKE 1 TABLET BY MOUTH TWICE DAILY AFTER A MEAL  Dispense: 180 tablet; Refill: 1 - metFORMIN (GLUCOPHAGE) 1000 MG tablet; Take 1 tablet (1,000 mg total) by mouth 2 (two) times daily with a meal.  Dispense: 180 tablet; Refill: 1 - sitaGLIPtin (JANUVIA) 100 MG tablet; Take 1 tablet (100 mg total) by mouth daily.  Dispense: 90 tablet; Refill: 0 - Urine Microalbumin w/creat. ratio - Ambulatory referral to Ophthalmology  2. Hypertriglyceridemia Continue on Vascepa and atorvastatin, obtain FLP - atorvastatin (LIPITOR) 20 MG tablet; Take 1 tablet (20 mg total) by mouth daily at 6 PM. Take 1 tablet ( 20 mg total ) by mouth at bedtime.  Dispense: 90 tablet; Refill: 1 - Icosapent Ethyl (VASCEPA) 1 g CAPS; Take 2 capsules by mouth 2 (two) times daily after a meal.  Dispense: 360 capsule; Refill: 1 - Lipid panel - COMPLETE METABOLIC PANEL WITH GFR  3. HTN (hypertension), benign Be stable on present antihypertensive treatment - losartan-hydrochlorothiazide (HYZAAR) 50-12.5 MG tablet; Take 1 tablet by mouth daily.  Dispense: 90 tablet; Refill: 1  4. Hematuria, unspecified type Will obtain urinalysis for evaluation of possible hematuria - Urinalysis, Routine w reflex microscopic  5. History and physical examination, immigration Patient wants me to fill out the immigration form to seek exemption from testing for citizenship services, he has been diagnosed with depression, memory impairment and is requesting to use those qualifiers. I have advised him to check with neurologist and her psychiatrist and have provided the contact information to their offices.  Interpreter ID#: [id number] Note: Total face-to-face time 40 minutes, greater than 50% was spent in counseling and coordination of care with the patient. This included interpretation, managing multiple medical conditions, and explaining that he should check with the  neurologist for completion of immigration form.  Geetika Laborde [facility] 07/09/2017 1:45 PM

## 2017-07-10 LAB — LIPID PANEL
CHOL/HDL RATIO: 5 (calc) — AB (ref <5.0)
CHOLESTEROL: 200 mg/dL — AB (ref <200)
HDL: 40 mg/dL — AB (ref >40)
LDL CHOLESTEROL (CALC): 115 mg/dL — AB
Non-HDL Cholesterol (Calc): 160 mg/dL (calc) — ABNORMAL HIGH (ref <130)
Triglycerides: 292 mg/dL — ABNORMAL HIGH (ref <150)

## 2017-07-10 LAB — COMPLETE METABOLIC PANEL WITH GFR
AG RATIO: 1.6 (calc) (ref 1.0–2.5)
ALBUMIN MSPROF: 4.8 g/dL (ref 3.6–5.1)
ALKALINE PHOSPHATASE (APISO): 63 U/L (ref 40–115)
ALT: 15 U/L (ref 9–46)
AST: 14 U/L (ref 10–35)
BUN: 24 mg/dL (ref 7–25)
CO2: 30 mmol/L (ref 20–32)
CREATININE: 1.06 mg/dL (ref 0.70–1.33)
Calcium: 9.8 mg/dL (ref 8.6–10.3)
Chloride: 95 mmol/L — ABNORMAL LOW (ref 98–110)
GFR, EST NON AFRICAN AMERICAN: 76 mL/min/{1.73_m2} (ref >=60)
GFR, Est African American: 89 mL/min/{1.73_m2} (ref >=60)
GLOBULIN: 3 g/dL (ref 1.9–3.7)
Glucose, Bld: 157 mg/dL — ABNORMAL HIGH (ref 65–99)
POTASSIUM: 3.9 mmol/L (ref 3.5–5.3)
SODIUM: 137 mmol/L (ref 135–146)
Total Bilirubin: 1.3 mg/dL — ABNORMAL HIGH (ref 0.2–1.2)
Total Protein: 7.8 g/dL (ref 6.1–8.1)

## 2017-07-10 LAB — MICROALBUMIN / CREATININE URINE RATIO
Creatinine, Urine: 132 mg/dL (ref 20–320)
MICROALB/CREAT RATIO: 315 ug/mg{creat} — AB (ref <30)
Microalb, Ur: 41.6 mg/dL

## 2017-07-10 LAB — URINALYSIS, ROUTINE W REFLEX MICROSCOPIC
BILIRUBIN URINE: NEGATIVE
Bacteria, UA: NONE SEEN /HPF
HYALINE CAST: NONE SEEN /LPF
Hgb urine dipstick: NEGATIVE
Ketones, ur: NEGATIVE
Leukocytes, UA: NEGATIVE
NITRITE: NEGATIVE
PH: 7 (ref 5.0–8.0)
SPECIFIC GRAVITY, URINE: 1.025 (ref 1.001–1.03)
Squamous Epithelial / LPF: NONE SEEN /HPF (ref <=5)
WBC, UA: NONE SEEN /HPF (ref 0–5)

## 2017-09-16 ENCOUNTER — Ambulatory Visit: Payer: Medicaid Other | Admitting: Diagnostic Neuroimaging

## 2018-01-20 ENCOUNTER — Other Ambulatory Visit: Payer: Self-pay

## 2018-01-20 ENCOUNTER — Ambulatory Visit: Payer: Medicaid Other | Admitting: Family Medicine

## 2018-01-20 ENCOUNTER — Encounter: Payer: Self-pay | Admitting: Family Medicine

## 2018-01-20 VITALS — BP 124/86 | HR 91 | Temp 97.9°F | Resp 16 | Ht 68.0 in | Wt 161.3 lb

## 2018-01-20 DIAGNOSIS — E1165 Type 2 diabetes mellitus with hyperglycemia: Secondary | ICD-10-CM | POA: Diagnosis not present

## 2018-01-20 DIAGNOSIS — I10 Essential (primary) hypertension: Secondary | ICD-10-CM | POA: Diagnosis not present

## 2018-01-20 DIAGNOSIS — Z5320 Procedure and treatment not carried out because of patient's decision for unspecified reasons: Secondary | ICD-10-CM

## 2018-01-20 DIAGNOSIS — Z1159 Encounter for screening for other viral diseases: Secondary | ICD-10-CM

## 2018-01-20 DIAGNOSIS — F09 Unspecified mental disorder due to known physiological condition: Secondary | ICD-10-CM

## 2018-01-20 DIAGNOSIS — G4700 Insomnia, unspecified: Secondary | ICD-10-CM

## 2018-01-20 DIAGNOSIS — R140 Abdominal distension (gaseous): Secondary | ICD-10-CM

## 2018-01-20 DIAGNOSIS — F331 Major depressive disorder, recurrent, moderate: Secondary | ICD-10-CM

## 2018-01-20 DIAGNOSIS — E785 Hyperlipidemia, unspecified: Secondary | ICD-10-CM

## 2018-01-20 DIAGNOSIS — E1129 Type 2 diabetes mellitus with other diabetic kidney complication: Secondary | ICD-10-CM

## 2018-01-20 DIAGNOSIS — E781 Pure hyperglyceridemia: Secondary | ICD-10-CM

## 2018-01-20 DIAGNOSIS — R198 Other specified symptoms and signs involving the digestive system and abdomen: Secondary | ICD-10-CM | POA: Diagnosis not present

## 2018-01-20 DIAGNOSIS — F339 Major depressive disorder, recurrent, unspecified: Secondary | ICD-10-CM | POA: Insufficient documentation

## 2018-01-20 DIAGNOSIS — R809 Proteinuria, unspecified: Secondary | ICD-10-CM

## 2018-01-20 LAB — POCT GLYCOSYLATED HEMOGLOBIN (HGB A1C): HBA1C, POC (CONTROLLED DIABETIC RANGE): 9.6 % — AB (ref 0.0–7.0)

## 2018-01-20 MED ORDER — TRAZODONE HCL 50 MG PO TABS: 25.0000 mg | ORAL_TABLET | Freq: Every evening | ORAL | 1 refills | Status: DC | PRN

## 2018-01-20 MED ORDER — EMPAGLIFLOZIN 25 MG PO TABS: 25.0000 mg | ORAL_TABLET | Freq: Every day | ORAL | 2 refills | Status: DC

## 2018-01-20 MED ORDER — FLUOXETINE HCL 20 MG PO CAPS: 20.0000 mg | ORAL_CAPSULE | Freq: Every day | ORAL | 1 refills | Status: DC

## 2018-01-20 NOTE — Telephone Encounter (Signed)
Patient states he needed refills on all of his medications and a glucose meter that is approved by Medicaid.

## 2018-01-20 NOTE — Progress Notes (Signed)
Name: Daniel Powell   MRN: [id number]    DOB: 10/17/1955   Date:01/26/2018       Progress Note  Subjective  Chief Complaint  Chief Complaint  Patient presents with  . Medication Refill  . Diabetes    Has had his glasses for 8 years-Vision is getting worst and needs new referral. Is terrified of needles and does not want to take Insulin also wants to see if he can get the BS meter on his arm  . Hyperlipidemia  . Hypertension    Dizzy symptoms lately  . Urinary Retention    Having trouble going to the bathroom-will have to pass gas to even go urinate-Stomach feels very hard and tight  . Insomnia    Unable to sleep at night due to back pain  . Back Pain    Since back surgery his middle back pain is constantly and prevents him from sleeping  . Foot Pain    Burning symptoms in his feet  . Memory Loss    Has a friend in [place] and would like the report from his Neurologist appointment Dr. Manuella Ghazi in [place]    HPI  Used phone interpreter.   DMII: he went to [place] for 5 months, returned [facility]illed by Dr. Jaquita Rector. He is currently on Januvia, Metformin and Glyburide . He is not checking his FSBS. He denies polyphagia, but has noticed blurred vision, frequent urination, fatigue.   Abdominal pain and distention: he stopped taking omeprazole, he states he has noticed decrease frequency of bowel movements. He could not tell me how many times a week, but no bowel movements yesterday. No blood in stools. He states pain has been going on for months, but abdomen is getting bigger and tighter now. He has episodes of nausea.   Major Depression and insomnia: used to see Dr. Shea Evans, last visit in Dec , he was given Prozac and Trazodone but stopped taking medications on his own. Advised to go back to see her and he is willing to resume medication until he gets to see him again  Chronic back pain: explained that we don't treat chronic pain but we  can place referral for pain clinic when he decides to go   Dyslipidemia: he has been off Lovaza, taking atorvastatin 20 mg. We will check labs and adjust medication as needed. He did not complain of myalgia today.   Cognitive impairment : he states he forgets to take his medications, his memory have been getting worse. Seen by neurologist . Dr. Dominga Ferry but states he would like to see someone locally now.   Microalbuminuria: he needs to go back to nephrologist, on ARB, denies side effects of medications.    Patient Active Problem List   Diagnosis Date Noted  . Cholelithiasis without cholecystitis 01/24/2018  . Hepatic steatosis 01/24/2018  . Right inguinal hernia 01/24/2018  . Abdominal pain 01/23/2018  . Abdominal distention   . Acute pancreatitis   . Major depression, recurrent ([place]) 01/20/2018  . HTN (hypertension), benign 04/03/2017  . Hypertriglyceridemia 07/27/2015  . Diabetes mellitus type 2, uncontrolled ([place]) 03/01/2015  . Dyslipidemia 03/01/2015  . Back pain, thoracic 03/01/2015  . Persistent headaches 03/01/2015  . Folliculitis 22/97/9892  . Fracture of lumbar spine ([place]) 11/05/2013    Past Surgical History:  Procedure Laterality Date  . BACK SURGERY  2014  . HERNIA REPAIR      Family History  Problem Relation Age  of Onset  . Heart attack Mother   . Diabetes Mother   . Hypertension Mother   . Heart disease Father   . Diabetes Father   . Hypertension Father   . Diabetes Sister   . Hypertension Sister     Social History   Socioeconomic History  . Marital status: Legally Separated    Spouse name: Not on file  . Number of children: Not on file  . Years of education: Not on file  . Highest education level: Not on file  Occupational History  . Not on file  Social Needs  . Financial resource strain: Not on file  . Food insecurity:    Worry: Not on file    Inability: Not on file  . Transportation needs:    Medical: Not on file    Non-medical: Not on  file  Tobacco Use  . Smoking status: Never Smoker  . Smokeless tobacco: Never Used  Substance and Sexual Activity  . Alcohol use: No    Alcohol/week: 0.0 oz  . Drug use: No  . Sexual activity: Not on file  Lifestyle  . Physical activity:    Days per week: Not on file    Minutes per session: Not on file  . Stress: Not on file  Relationships  . Social connections:    Talks on phone: Not on file    Gets together: Not on file    Attends religious service: Not on file    Active member of club or organization: Not on file    Attends meetings of clubs or organizations: Not on file    Relationship status: Not on file  . Intimate partner violence:    Fear of current or ex partner: Not on file    Emotionally abused: Not on file    Physically abused: Not on file    Forced sexual activity: Not on file  Other Topics Concern  . Not on file  Social History Narrative   Lives at home with son.  Does not work.  Has significant other.       Current Outpatient Medications:  .  atorvastatin (LIPITOR) 20 MG tablet, Take 1 tablet (20 mg total) by mouth daily at 6 PM. Take 1 tablet ( 20 mg total ) by mouth at bedtime., Disp: 90 tablet, Rfl: 1 .  gabapentin (NEURONTIN) 300 MG capsule, Take 1 capsule (300 mg total) by mouth at bedtime., Disp: 90 capsule, Rfl: 1 .  glimepiride (AMARYL) 4 MG tablet, TAKE 1 TABLET BY MOUTH TWICE DAILY AFTER A MEAL, Disp: 180 tablet, Rfl: 1 .  losartan-hydrochlorothiazide (HYZAAR) 100-12.5 MG tablet, Take 1 tablet by mouth daily., Disp: , Rfl: 1 .  metFORMIN (GLUCOPHAGE) 1000 MG tablet, Take 1 tablet (1,000 mg total) by mouth 2 (two) times daily with a meal., Disp: 180 tablet, Rfl: 1 .  traZODone (DESYREL) 50 MG tablet, Take 0.5-1 tablets (25-50 mg total) by mouth at bedtime as needed for sleep., Disp: 30 tablet, Rfl: 1 .  Blood Glucose Monitoring Suppl (ACCU-CHEK AVIVA PLUS) w/Device KIT, 1 kit by Does not apply route daily., Disp: 1 kit, Rfl: 0 .  empagliflozin  (JARDIANCE) 25 MG TABS tablet, Take 25 mg by mouth daily., Disp: 30 tablet, Rfl: 2 .  FLUoxetine (PROZAC) 20 MG capsule, Take 1 capsule (20 mg total) by mouth daily., Disp: 30 capsule, Rfl: 1 .  glucose blood (ACCU-CHEK AVIVA PLUS) test strip, Use as instructed, Disp: 100 each, Rfl: 12 .  sitaGLIPtin (JANUVIA)  100 MG tablet, Take 1 tablet (100 mg total) by mouth daily., Disp: 90 tablet, Rfl: 0  Allergies  Allergen Reactions  . Gadolinium Derivatives Itching    Pt began itching across ribs and back and neck s/p Multihance injection     ROS  Constitutional: Negative for fever or weight change.  Respiratory: Negative for cough and shortness of breath.   Cardiovascular: Negative for chest pain or palpitations.  Gastrointestinal: Positive for abdominal pain, positive for  bowel changes.  Musculoskeletal: Negative for gait problem or joint swelling.  Skin: Negative for rash.  Neurological: Negative for dizziness or headache.  No other specific complaints in a complete review of systems (except as listed in HPI above).  Objective  Vitals:   01/20/18 1519  BP: 124/86  Pulse: 91  Resp: 16  Temp: 97.9 F (36.6 C)  TempSrc: Oral  SpO2: 96%  Weight: 161 lb 4.8 oz (73.2 kg)    Body mass index is 24.53 kg/m.  Physical Exam  Constitutional: Patient appears well-developed and well-nourished.  No distress.  HEENT: head atraumatic, normocephalic, pupils equal and reactive to light,  neck supple, throat within normal limits Cardiovascular: Normal rate, regular rhythm and normal heart sounds.  No murmur heard. No BLE edema. Pulmonary/Chest: Effort normal and breath sounds normal. No respiratory distress. Abdominal: Soft.  There is distension, positive for ascites, mild diffuse tenderness  Psychiatric: Patient has a normal mood and affect.    Recent Results (from the past 2160 hour(s))  POCT HgB A1C     Status: Abnormal   Collection Time: 01/20/18  3:50 PM  Result Value Ref Range    Hemoglobin A1C  4.0 - 5.6 %   HbA1c POC (<> result, manual entry)  4.0 - 5.6 %   HbA1c, POC (prediabetic range)  5.7 - 6.4 %   HbA1c, POC (controlled diabetic range) 9.6 (A) 0.0 - 7.0 %  CBC with Differential/Platelet     Status: None   Collection Time: 01/20/18  4:32 PM  Result Value Ref Range   WBC 5.2 3.8 - 10.8 Thousand/uL   RBC 5.25 4.20 - 5.80 Million/uL   Hemoglobin 14.8 13.2 - 17.1 g/dL   HCT 43.5 38.5 - 50.0 %   MCV 82.9 80.0 - 100.0 fL   MCH 28.2 27.0 - 33.0 pg   MCHC 34.0 32.0 - 36.0 g/dL   RDW 13.3 11.0 - 15.0 %   Platelets 205 140 - 400 Thousand/uL   MPV 9.2 7.5 - 12.5 fL   Neutro Abs 3,115 1,500 - 7,800 cells/uL   Lymphs Abs 1,420 850 - 3,900 cells/uL   WBC mixed population 432 200 - 950 cells/uL   Eosinophils Absolute 172 15 - 500 cells/uL   Basophils Absolute 62 0 - 200 cells/uL   Neutrophils Relative % 59.9 %   Total Lymphocyte 27.3 %   Monocytes Relative 8.3 %   Eosinophils Relative 3.3 %   Basophils Relative 1.2 %  Lipid panel     Status: Abnormal   Collection Time: 01/20/18  4:32 PM  Result Value Ref Range   Cholesterol 161 <200 mg/dL   HDL 35 (L) >40 mg/dL   Triglycerides 371 (H) <150 mg/dL    Comment: . If a non-fasting specimen was collected, consider repeat triglyceride testing on a fasting specimen if clinically indicated.  Yates Decamp et al. J. of Clin. Lipidol. 7793;9:030-092. Marland Kitchen    LDL Cholesterol (Calc) 81 mg/dL (calc)    Comment: Reference range: <100 . Desirable range <100  mg/dL for primary prevention;   <70 mg/dL for patients with CHD or diabetic patients  with > or = 2 CHD risk factors. Marland Kitchen LDL-C is now calculated using the Martin-Hopkins  calculation, which is a validated novel method providing  better accuracy than the Friedewald equation in the  estimation of LDL-C.  Cresenciano Genre et al. Annamaria Helling. 4010;272(53): 2061-2068  (http://education.[facility].com/faq/FAQ164)    Total CHOL/HDL Ratio 4.6 <5.0 (calc)   Non-HDL Cholesterol (Calc)  126 <130 mg/dL (calc)    Comment: For patients with diabetes plus 1 major ASCVD risk  factor, treating to a non-HDL-C goal of <100 mg/dL  (LDL-C of <70 mg/dL) is considered a therapeutic  option.   HIV antibody     Status: None   Collection Time: 01/20/18  4:32 PM  Result Value Ref Range   HIV 1&2 Ab, 4th Generation NON-REACTIVE NON-REACTI    Comment: HIV-1 antigen and HIV-1/HIV-2 antibodies were not detected. There is no laboratory evidence of HIV infection. Marland Kitchen PLEASE NOTE: This information has been disclosed to you from records whose confidentiality may be protected by state law.  If your state requires such protection, then the state law prohibits you from making any further disclosure of the information without the specific written consent of the person to whom it pertains, or as otherwise permitted by law. A general authorization for the release of medical or other information is NOT sufficient for this purpose. . For additional information please refer to http://education.[facility].com/faq/FAQ106 (This link is being provided for informational/ educational purposes only.) . Marland Kitchen The performance of this assay has not been clinically validated in patients less than 1 years old. .   Hepatitis C Antibody     Status: None   Collection Time: 01/20/18  4:32 PM  Result Value Ref Range   Hepatitis C Ab NON-REACTIVE NON-REACTI   SIGNAL TO CUT-OFF 0.02 <1.00    Comment: . HCV antibody was non-reactive. There is no laboratory  evidence of HCV infection. . In most cases, no further action is required. However, if recent HCV exposure is suspected, a test for HCV RNA (test code [phone]) is suggested. . For additional information please refer to http://education.[facility].com/faq/FAQ22v1 (This link is being provided for informational/ educational purposes only.) .   COMPLETE METABOLIC PANEL WITH GFR     Status: Abnormal   Collection Time: 01/20/18  4:32 PM  Result  Value Ref Range   Glucose, Bld 237 (H) 65 - 139 mg/dL    Comment: .        Non-fasting reference interval .    BUN 23 7 - 25 mg/dL   Creat 1.12 0.70 - 1.25 mg/dL    Comment: For patients >86 years of age, the reference limit for Creatinine is approximately 13% higher for people identified as African-American. .    GFR, Est Non African American 71 > OR = 60 mL/min/1.18m   GFR, Est African American 82 > OR = 60 mL/min/1.767m  BUN/Creatinine Ratio NOT APPLICABLE 6 - 22 (calc)   Sodium 137 135 - 146 mmol/L   Potassium 4.2 3.5 - 5.3 mmol/L   Chloride 99 98 - 110 mmol/L   CO2 28 20 - 32 mmol/L   Calcium 10.3 8.6 - 10.3 mg/dL   Total Protein 7.6 6.1 - 8.1 g/dL   Albumin 4.9 3.6 - 5.1 g/dL   Globulin 2.7 1.9 - 3.7 g/dL (calc)   AG Ratio 1.8 1.0 - 2.5 (calc)   Total Bilirubin 1.1 0.2 - 1.2 mg/dL  Alkaline phosphatase (APISO) 64 40 - 115 U/L   AST 15 10 - 35 U/L   ALT 22 9 - 46 U/L  CBC     Status: Abnormal   Collection Time: 01/23/18  8:07 AM  Result Value Ref Range   WBC 7.1 3.8 - 10.6 K/uL   RBC 4.78 4.40 - 5.90 MIL/uL   Hemoglobin 14.0 13.0 - 18.0 g/dL   HCT 39.6 (L) 40.0 - 52.0 %   MCV 82.8 80.0 - 100.0 fL   MCH 29.3 26.0 - 34.0 pg   MCHC 35.4 32.0 - 36.0 g/dL   RDW 13.9 11.5 - 14.5 %   Platelets 183 150 - 440 K/uL    Comment: Performed at [facility], [address] [place], [place] [address]  Comprehensive metabolic panel     Status: Abnormal   Collection Time: 01/23/18  8:07 AM  Result Value Ref Range   Sodium 139 135 - 145 mmol/L   Potassium 4.0 3.5 - 5.1 mmol/L   Chloride 101 98 - 111 mmol/L   CO2 27 22 - 32 mmol/L   Glucose, Bld 333 (H) 70 - 99 mg/dL   BUN 27 (H) 6 - 20 mg/dL   Creatinine, Ser 1.77 (H) 0.61 - 1.24 mg/dL   Calcium 9.2 8.9 - 10.3 mg/dL   Total Protein 6.9 6.5 - 8.1 g/dL   Albumin 4.2 3.5 - 5.0 g/dL   AST 28 15 - 41 U/L   ALT 25 0 - 44 U/L   Alkaline Phosphatase 55 38 - 126 U/L   Total Bilirubin 1.2 0.3 - 1.2 mg/dL   GFR calc  non Af Amer 40 (L) >60 mL/min   GFR calc Af Amer 46 (L) >60 mL/min    Comment: (NOTE) The eGFR has been calculated using the CKD EPI equation. This calculation has not been validated in all clinical situations. eGFR's persistently <60 mL/min signify possible Chronic Kidney Disease.    Anion gap 11 5 - 15    Comment: Performed at [facility], [address] [place], [place] [address]  Lipase, blood     Status: Abnormal   Collection Time: 01/23/18  8:07 AM  Result Value Ref Range   Lipase 271 (H) 11 - 51 U/L    Comment: Performed at [facility], [address] [place], [place] [address]  Urinalysis, Complete w Microscopic     Status: Abnormal   Collection Time: 01/23/18  8:50 AM  Result Value Ref Range   Color, Urine STRAW (A) YELLOW   APPearance CLEAR (A) CLEAR   Specific Gravity, Urine 1.006 1.005 - 1.030   pH 7.0 5.0 - 8.0   Glucose, UA >=500 (A) NEGATIVE mg/dL   Hgb urine dipstick NEGATIVE NEGATIVE   Bilirubin Urine NEGATIVE NEGATIVE   Ketones, ur NEGATIVE NEGATIVE mg/dL   Protein, ur NEGATIVE NEGATIVE mg/dL   Nitrite NEGATIVE NEGATIVE   Leukocytes, UA NEGATIVE NEGATIVE   RBC / HPF 0-5 0 - 5 RBC/hpf   WBC, UA 0-5 0 - 5 WBC/hpf   Bacteria, UA NONE SEEN NONE SEEN   Squamous Epithelial / LPF NONE SEEN 0 - 5    Comment: Performed at [facility], [address] [place], [place] [address]  Lactic acid, plasma     Status: None   Collection Time: 01/23/18  2:58 PM  Result Value Ref Range   Lactic Acid, Venous 1.5 0.5 - 1.9 mmol/L    Comment: Performed at [facility],  [place], [place] [address]  Sedimentation rate     Status: None   Collection Time: 01/23/18  2:58 PM  Result Value Ref Range   Sed Rate 13 0 - 20 mm/hr    Comment: Performed at [facility], [address] [place], [place] [address]  Glucose, capillary     Status: Abnormal   Collection Time: 01/23/18  5:24 PM  Result Value Ref Range    Glucose-Capillary 137 (H) 70 - 99 mg/dL   Comment 1 Notify RN   Lactic acid, plasma     Status: None   Collection Time: 01/23/18  5:39 PM  Result Value Ref Range   Lactic Acid, Venous 0.9 0.5 - 1.9 mmol/L    Comment: Performed at [facility], [address] [place], [place] [address]  Glucose, capillary     Status: None   Collection Time: 01/23/18  9:26 PM  Result Value Ref Range   Glucose-Capillary 84 70 - 99 mg/dL   Comment 1 Notify RN   Glucose, capillary     Status: None   Collection Time: 01/23/18 11:59 PM  Result Value Ref Range   Glucose-Capillary 93 70 - 99 mg/dL   Comment 1 Notify RN   Lipase, blood     Status: Abnormal   Collection Time: 01/24/18  5:25 AM  Result Value Ref Range   Lipase 83 (H) 11 - 51 U/L    Comment: Performed at [facility], [address] [place], [place] [address]  Comprehensive metabolic panel     Status: Abnormal   Collection Time: 01/24/18  5:26 AM  Result Value Ref Range   Sodium 139 135 - 145 mmol/L   Potassium 3.8 3.5 - 5.1 mmol/L   Chloride 103 98 - 111 mmol/L   CO2 29 22 - 32 mmol/L   Glucose, Bld 84 70 - 99 mg/dL   BUN 18 6 - 20 mg/dL   Creatinine, Ser 1.06 0.61 - 1.24 mg/dL   Calcium 8.4 (L) 8.9 - 10.3 mg/dL   Total Protein 6.2 (L) 6.5 - 8.1 g/dL   Albumin 3.6 3.5 - 5.0 g/dL   AST 20 15 - 41 U/L   ALT 20 0 - 44 U/L   Alkaline Phosphatase 38 38 - 126 U/L   Total Bilirubin 1.4 (H) 0.3 - 1.2 mg/dL   GFR calc non Af Amer >60 >60 mL/min   GFR calc Af Amer >60 >60 mL/min    Comment: (NOTE) The eGFR has been calculated using the CKD EPI equation. This calculation has not been validated in all clinical situations. eGFR's persistently <60 mL/min signify possible Chronic Kidney Disease.    Anion gap 7 5 - 15    Comment: Performed at [facility], [address] [place], [place] [address]  CBC     Status: Abnormal   Collection Time: 01/24/18  5:26 AM  Result Value Ref Range   WBC 5.6 3.8 - 10.6 K/uL    RBC 4.39 (L) 4.40 - 5.90 MIL/uL   Hemoglobin 13.0 13.0 - 18.0 g/dL   HCT 36.5 (L) 40.0 - 52.0 %   MCV 83.0 80.0 - 100.0 fL   MCH 29.5 26.0 - 34.0 pg   MCHC 35.6 32.0 - 36.0 g/dL   RDW 13.8 11.5 - 14.5 %   Platelets 174 150 - 440 K/uL    Comment: Performed at [facility], [address], [place], [place] [address]  Glucose, capillary  Status: None   Collection Time: 01/24/18  7:54 AM  Result Value Ref Range   Glucose-Capillary 83 70 - 99 mg/dL   Comment 1 Notify RN   Glucose, capillary     Status: None   Collection Time: 01/24/18 11:40 AM  Result Value Ref Range   Glucose-Capillary 79 70 - 99 mg/dL   Comment 1 Notify RN       PHQ2/9: Depression screen [facility] 2/9 01/20/2018 07/09/2017 07/02/2017 06/25/2017 04/10/2017  Decreased Interest 0 0 0 0 0  Down, Depressed, Hopeless 2 0 0 0 0  PHQ - 2 Score 2 0 0 0 0  Altered sleeping 3 - - - -  Tired, decreased energy 3 - - - -  Change in appetite 0 - - - -  Feeling bad or failure about yourself  0 - - - -  Trouble concentrating 1 - - - -  Moving slowly or fidgety/restless 1 - - - -  Suicidal thoughts 0 - - - -  PHQ-9 Score 10 - - - -  Difficult doing work/chores Not difficult at all - - - -     Fall Risk: Fall Risk  01/20/2018 07/09/2017 07/02/2017 06/25/2017 04/10/2017  Falls in the past year? No No No No No     Functional Status Survey: Is the patient deaf or have difficulty hearing?: No Does the patient have difficulty seeing, even when wearing glasses/contacts?: Yes Does the patient have difficulty concentrating, remembering, or making decisions?: Yes(Remembering) Does the patient have difficulty walking or climbing stairs?: No Does the patient have difficulty dressing or bathing?: No Does the patient have difficulty doing errands alone such as visiting a doctor's office or shopping?: No   Assessment & Plan  1. Uncontrolled type 2 diabetes mellitus with hyperglycemia (HCC)  - POCT HgB A1C  2. HTN  (hypertension), benign  - COMPLETE METABOLIC PANEL WITH GFR  3. Dyslipidemia  - Lipid panel  4. Change in bowel movement  - Ambulatory referral to Gastroenterology  5. Bloating  - Ambulatory referral to Gastroenterology  6. Essential hypertension  - CBC with Differential/Platelet  7. Microalbuminuria due to type 2 diabetes mellitus (HCC)  - empagliflozin (JARDIANCE) 25 MG TABS tablet; Take 25 mg by mouth daily.  Dispense: 30 tablet; Refill: 2 - Ambulatory referral to Ophthalmology - Ambulatory referral to Nephrology  8. MDD (major depressive disorder), recurrent episode, moderate (HCC)  - FLUoxetine (PROZAC) 20 MG capsule; Take 1 capsule (20 mg total) by mouth daily.  Dispense: 30 capsule; Refill: 1 - traZODone (DESYREL) 50 MG tablet; Take 0.5-1 tablets (25-50 mg total) by mouth at bedtime as needed for sleep.  Dispense: 30 tablet; Refill: 1 - referral psychiatrist - to go back to Dr. Shea Evans   9. Insomnia, unspecified type  - traZODone (DESYREL) 50 MG tablet; Take 0.5-1 tablets (25-50 mg total) by mouth at bedtime as needed for sleep.  Dispense: 30 tablet; Refill: 1  10. Need for hepatitis C screening test  - Hepatitis C Antibody  11. HIV screening declined  - HIV antibody  12. Abdominal distention  - US Abdomen Complete; Future

## 2018-01-21 ENCOUNTER — Other Ambulatory Visit: Payer: Self-pay | Admitting: Family Medicine

## 2018-01-21 ENCOUNTER — Encounter: Payer: Self-pay | Admitting: Gastroenterology

## 2018-01-21 LAB — COMPLETE METABOLIC PANEL WITH GFR
AG RATIO: 1.8 (calc) (ref 1.0–2.5)
ALKALINE PHOSPHATASE (APISO): 64 U/L (ref 40–115)
ALT: 22 U/L (ref 9–46)
AST: 15 U/L (ref 10–35)
Albumin: 4.9 g/dL (ref 3.6–5.1)
BILIRUBIN TOTAL: 1.1 mg/dL (ref 0.2–1.2)
BUN: 23 mg/dL (ref 7–25)
CHLORIDE: 99 mmol/L (ref 98–110)
CO2: 28 mmol/L (ref 20–32)
Calcium: 10.3 mg/dL (ref 8.6–10.3)
Creat: 1.12 mg/dL (ref 0.70–1.25)
GFR, EST NON AFRICAN AMERICAN: 71 mL/min/{1.73_m2} (ref >=60)
GFR, Est African American: 82 mL/min/{1.73_m2} (ref >=60)
GLOBULIN: 2.7 g/dL (ref 1.9–3.7)
Glucose, Bld: 237 mg/dL — ABNORMAL HIGH (ref 65–139)
POTASSIUM: 4.2 mmol/L (ref 3.5–5.3)
SODIUM: 137 mmol/L (ref 135–146)
Total Protein: 7.6 g/dL (ref 6.1–8.1)

## 2018-01-21 LAB — CBC WITH DIFFERENTIAL/PLATELET
BASOS PCT: 1.2 %
Basophils Absolute: 62 cells/uL (ref 0–200)
EOS ABS: 172 {cells}/uL (ref 15–500)
Eosinophils Relative: 3.3 %
HEMATOCRIT: 43.5 % (ref 38.5–50.0)
HEMOGLOBIN: 14.8 g/dL (ref 13.2–17.1)
LYMPHS ABS: 1420 {cells}/uL (ref 850–3900)
MCH: 28.2 pg (ref 27.0–33.0)
MCHC: 34 g/dL (ref 32.0–36.0)
MCV: 82.9 fL (ref 80.0–100.0)
MPV: 9.2 fL (ref 7.5–12.5)
Monocytes Relative: 8.3 %
Neutro Abs: 3115 cells/uL (ref 1500–7800)
Neutrophils Relative %: 59.9 %
PLATELETS: 205 10*3/uL (ref 140–400)
RBC: 5.25 10*6/uL (ref 4.20–5.80)
RDW: 13.3 % (ref 11.0–15.0)
Total Lymphocyte: 27.3 %
WBC: 5.2 10*3/uL (ref 3.8–10.8)
WBCMIX: 432 {cells}/uL (ref 200–950)

## 2018-01-21 LAB — LIPID PANEL
CHOL/HDL RATIO: 4.6 (calc) (ref <5.0)
Cholesterol: 161 mg/dL (ref <200)
HDL: 35 mg/dL — ABNORMAL LOW (ref >40)
LDL CHOLESTEROL (CALC): 81 mg/dL
NON-HDL CHOLESTEROL (CALC): 126 mg/dL (ref <130)
TRIGLYCERIDES: 371 mg/dL — AB (ref <150)

## 2018-01-21 LAB — HIV ANTIBODY (ROUTINE TESTING W REFLEX): HIV 1&2 Ab, 4th Generation: NONREACTIVE

## 2018-01-21 LAB — HEPATITIS C ANTIBODY
HEP C AB: NONREACTIVE
SIGNAL TO CUT-OFF: 0.02 (ref <1.00)

## 2018-01-21 MED ORDER — ACCU-CHEK AVIVA PLUS W/DEVICE KIT: 1.0000 | PACK | Freq: Every day | 0 refills | Status: DC

## 2018-01-21 MED ORDER — GLUCOSE BLOOD VI STRP: ORAL_STRIP | 12 refills | Status: DC

## 2018-01-21 MED ORDER — OMEGA-3-ACID ETHYL ESTERS 1 G PO CAPS: 2.0000 g | ORAL_CAPSULE | Freq: Two times a day (BID) | ORAL | 2 refills | Status: DC

## 2018-01-23 ENCOUNTER — Inpatient Hospital Stay
Admission: EM | Admit: 2018-01-23 | Discharge: 2018-01-24 | DRG: 439 | Disposition: A | Discharge status: Home / Self Care | Payer: Medicaid Other | Attending: Internal Medicine | Admitting: Internal Medicine

## 2018-01-23 ENCOUNTER — Other Ambulatory Visit: Payer: Self-pay

## 2018-01-23 ENCOUNTER — Emergency Department: Payer: Medicaid Other

## 2018-01-23 ENCOUNTER — Inpatient Hospital Stay: Payer: Medicaid Other

## 2018-01-23 ENCOUNTER — Encounter: Payer: Self-pay | Admitting: Medical Oncology

## 2018-01-23 DIAGNOSIS — Z7984 Long term (current) use of oral hypoglycemic drugs: Secondary | ICD-10-CM | POA: Diagnosis not present

## 2018-01-23 DIAGNOSIS — Z79899 Other long term (current) drug therapy: Secondary | ICD-10-CM

## 2018-01-23 DIAGNOSIS — R109 Unspecified abdominal pain: Secondary | ICD-10-CM

## 2018-01-23 DIAGNOSIS — K4090 Unilateral inguinal hernia, without obstruction or gangrene, not specified as recurrent: Secondary | ICD-10-CM | POA: Diagnosis present

## 2018-01-23 DIAGNOSIS — E1165 Type 2 diabetes mellitus with hyperglycemia: Secondary | ICD-10-CM | POA: Diagnosis present

## 2018-01-23 DIAGNOSIS — Z888 Allergy status to other drugs, medicaments and biological substances status: Secondary | ICD-10-CM | POA: Diagnosis not present

## 2018-01-23 DIAGNOSIS — K760 Fatty (change of) liver, not elsewhere classified: Secondary | ICD-10-CM | POA: Diagnosis present

## 2018-01-23 DIAGNOSIS — R140 Abdominal distension (gaseous): Secondary | ICD-10-CM | POA: Diagnosis present

## 2018-01-23 DIAGNOSIS — N179 Acute kidney failure, unspecified: Secondary | ICD-10-CM

## 2018-01-23 DIAGNOSIS — K859 Acute pancreatitis without necrosis or infection, unspecified: Secondary | ICD-10-CM | POA: Diagnosis present

## 2018-01-23 DIAGNOSIS — I10 Essential (primary) hypertension: Secondary | ICD-10-CM | POA: Diagnosis present

## 2018-01-23 DIAGNOSIS — F329 Major depressive disorder, single episode, unspecified: Secondary | ICD-10-CM | POA: Diagnosis present

## 2018-01-23 DIAGNOSIS — K8020 Calculus of gallbladder without cholecystitis without obstruction: Secondary | ICD-10-CM | POA: Diagnosis present

## 2018-01-23 HISTORY — DX: Essential (primary) hypertension: I10

## 2018-01-23 LAB — COMPREHENSIVE METABOLIC PANEL
ALT: 25 U/L (ref 0–44)
AST: 28 U/L (ref 15–41)
Albumin: 4.2 g/dL (ref 3.5–5.0)
Alkaline Phosphatase: 55 U/L (ref 38–126)
Anion gap: 11 (ref 5–15)
BUN: 27 mg/dL — AB (ref 6–20)
CO2: 27 mmol/L (ref 22–32)
Calcium: 9.2 mg/dL (ref 8.9–10.3)
Chloride: 101 mmol/L (ref 98–111)
Creatinine, Ser: 1.77 mg/dL — ABNORMAL HIGH (ref 0.61–1.24)
GFR, EST AFRICAN AMERICAN: 46 mL/min — AB (ref >60)
GFR, EST NON AFRICAN AMERICAN: 40 mL/min — AB (ref >60)
Glucose, Bld: 333 mg/dL — ABNORMAL HIGH (ref 70–99)
POTASSIUM: 4 mmol/L (ref 3.5–5.1)
SODIUM: 139 mmol/L (ref 135–145)
Total Bilirubin: 1.2 mg/dL (ref 0.3–1.2)
Total Protein: 6.9 g/dL (ref 6.5–8.1)

## 2018-01-23 LAB — URINALYSIS, COMPLETE (UACMP) WITH MICROSCOPIC
BACTERIA UA: NONE SEEN
BILIRUBIN URINE: NEGATIVE
Hgb urine dipstick: NEGATIVE
KETONES UR: NEGATIVE mg/dL
LEUKOCYTES UA: NEGATIVE
NITRITE: NEGATIVE
PH: 7 (ref 5.0–8.0)
PROTEIN: NEGATIVE mg/dL
Specific Gravity, Urine: 1.006 (ref 1.005–1.030)
Squamous Epithelial / LPF: NONE SEEN (ref 0–5)

## 2018-01-23 LAB — SEDIMENTATION RATE: Sed Rate: 13 mm/h (ref 0–20)

## 2018-01-23 LAB — GLUCOSE, CAPILLARY
GLUCOSE-CAPILLARY: 137 mg/dL — AB (ref 70–99)
Glucose-Capillary: 84 mg/dL (ref 70–99)

## 2018-01-23 LAB — CBC
HCT: 39.6 % — ABNORMAL LOW (ref 40.0–52.0)
Hemoglobin: 14 g/dL (ref 13.0–18.0)
MCH: 29.3 pg (ref 26.0–34.0)
MCHC: 35.4 g/dL (ref 32.0–36.0)
MCV: 82.8 fL (ref 80.0–100.0)
Platelets: 183 10*3/uL (ref 150–440)
RBC: 4.78 MIL/uL (ref 4.40–5.90)
RDW: 13.9 % (ref 11.5–14.5)
WBC: 7.1 10*3/uL (ref 3.8–10.6)

## 2018-01-23 LAB — LACTIC ACID, PLASMA
LACTIC ACID, VENOUS: 0.9 mmol/L (ref 0.5–1.9)
Lactic Acid, Venous: 1.5 mmol/L (ref 0.5–1.9)

## 2018-01-23 LAB — LIPASE, BLOOD: Lipase: 271 U/L — ABNORMAL HIGH (ref 11–51)

## 2018-01-23 MED ORDER — TRAZODONE HCL 50 MG PO TABS: 25.0000 mg | ORAL_TABLET | Freq: Every evening | ORAL | Status: DC | PRN

## 2018-01-23 MED ORDER — GABAPENTIN 300 MG PO CAPS
300.0000 mg | ORAL_CAPSULE | Freq: Every day | ORAL | Status: DC
  Administered 2018-01-23: 300 mg via ORAL
  Filled 2018-01-23: qty 1

## 2018-01-23 MED ORDER — SODIUM CHLORIDE 0.9 % IV BOLUS
500.0000 mL | Freq: Once | INTRAVENOUS | Status: AC
  Administered 2018-01-23: 500 mL via INTRAVENOUS

## 2018-01-23 MED ORDER — HYDROCODONE-ACETAMINOPHEN 5-325 MG PO TABS: 1.0000 | ORAL_TABLET | ORAL | Status: DC | PRN

## 2018-01-23 MED ORDER — SODIUM CHLORIDE 0.9% FLUSH
3.0000 mL | Freq: Two times a day (BID) | INTRAVENOUS | Status: DC
  Administered 2018-01-23 – 2018-01-24 (×2): 3 mL via INTRAVENOUS

## 2018-01-23 MED ORDER — ACETAMINOPHEN 325 MG PO TABS: 650.0000 mg | ORAL_TABLET | Freq: Four times a day (QID) | ORAL | Status: DC | PRN

## 2018-01-23 MED ORDER — INSULIN ASPART 100 UNIT/ML ~~LOC~~ SOLN: 0.0000 [IU] | Freq: Every day | SUBCUTANEOUS | Status: DC

## 2018-01-23 MED ORDER — FLUOXETINE HCL 20 MG PO CAPS
20.0000 mg | ORAL_CAPSULE | Freq: Every day | ORAL | Status: DC
  Administered 2018-01-23 – 2018-01-24 (×2): 20 mg via ORAL
  Filled 2018-01-23 (×2): qty 1

## 2018-01-23 MED ORDER — HYDROMORPHONE HCL 1 MG/ML IJ SOLN
0.5000 mg | INTRAMUSCULAR | Status: AC
  Administered 2018-01-23: 0.5 mg via INTRAVENOUS

## 2018-01-23 MED ORDER — PNEUMOCOCCAL VAC POLYVALENT 25 MCG/0.5ML IJ INJ: 0.5000 mL | INJECTION | INTRAMUSCULAR | Status: DC

## 2018-01-23 MED ORDER — ONDANSETRON HCL 4 MG/2ML IJ SOLN
4.0000 mg | Freq: Once | INTRAMUSCULAR | Status: AC
  Administered 2018-01-23: 4 mg via INTRAVENOUS
  Filled 2018-01-23: qty 2

## 2018-01-23 MED ORDER — SODIUM CHLORIDE 0.45 % IV SOLN
INTRAVENOUS | Status: DC
  Administered 2018-01-23 – 2018-01-24 (×3): via INTRAVENOUS

## 2018-01-23 MED ORDER — IOHEXOL 300 MG/ML  SOLN
75.0000 mL | Freq: Once | INTRAMUSCULAR | Status: AC | PRN
  Administered 2018-01-23: 75 mL via INTRAVENOUS

## 2018-01-23 MED ORDER — MORPHINE SULFATE (PF) 4 MG/ML IV SOLN
4.0000 mg | Freq: Once | INTRAVENOUS | Status: AC
  Administered 2018-01-23: 4 mg via INTRAVENOUS
  Filled 2018-01-23: qty 1

## 2018-01-23 MED ORDER — ACETAMINOPHEN 650 MG RE SUPP: 650.0000 mg | Freq: Four times a day (QID) | RECTAL | Status: DC | PRN

## 2018-01-23 MED ORDER — HEPARIN SODIUM (PORCINE) 5000 UNIT/ML IJ SOLN
5000.0000 [IU] | Freq: Three times a day (TID) | INTRAMUSCULAR | Status: DC
  Administered 2018-01-23 – 2018-01-24 (×2): 5000 [IU] via SUBCUTANEOUS
  Filled 2018-01-23 (×2): qty 1

## 2018-01-23 MED ORDER — HYDROMORPHONE HCL 1 MG/ML IJ SOLN
1.0000 mg | INTRAMUSCULAR | Status: DC
  Filled 2018-01-23: qty 1

## 2018-01-23 MED ORDER — INSULIN ASPART 100 UNIT/ML ~~LOC~~ SOLN
0.0000 [IU] | Freq: Three times a day (TID) | SUBCUTANEOUS | Status: DC
  Administered 2018-01-23: 2 [IU] via SUBCUTANEOUS
  Filled 2018-01-23: qty 1

## 2018-01-23 MED ORDER — ONDANSETRON HCL 4 MG PO TABS: 4.0000 mg | ORAL_TABLET | Freq: Four times a day (QID) | ORAL | Status: DC | PRN

## 2018-01-23 MED ORDER — ONDANSETRON HCL 4 MG/2ML IJ SOLN: 4.0000 mg | Freq: Four times a day (QID) | INTRAMUSCULAR | Status: DC | PRN

## 2018-01-23 MED ORDER — MORPHINE SULFATE (PF) 2 MG/ML IV SOLN: 2.0000 mg | INTRAVENOUS | Status: DC | PRN

## 2018-01-23 MED ORDER — LIDOCAINE HCL URETHRAL/MUCOSAL 2 % EX GEL
1.0000 "application " | Freq: Once | CUTANEOUS | Status: AC
  Administered 2018-01-23: 1 via URETHRAL
  Filled 2018-01-23: qty 10

## 2018-01-23 MED ORDER — HYDROMORPHONE HCL 1 MG/ML IJ SOLN
0.5000 mg | INTRAMUSCULAR | Status: AC
  Administered 2018-01-23: 0.5 mg via INTRAVENOUS
  Filled 2018-01-23: qty 1

## 2018-01-23 NOTE — ED Triage Notes (Signed)
Using interpreter Amal [id number]: Pt reports that he has been having pain in his abdomen and has been noticing since last night that his abdomen has been swelling, Last BM was yesterday- small amt. Pt also reports difficulty urinating, reports "gas" coming out first. Denies NVD

## 2018-01-23 NOTE — Consult Note (Signed)
Surgical Consultation  01/23/2018  Daniel Powell is an 62 y.o. male.   Referring Physician: Salary  CC: Nominal distention  HPI: This patient with abdominal distention and pain that has been going on for for 5 days.  Is had no nausea or vomiting.  He has had no diarrhea.  He has been quite distended.  I spoke to Dr. Jacqualine Code in the emergency room who could not find anything objective wrong on CT or labs etc.  He is being admitted by medicine for further evaluation.  At the time of my visit with the patient he has no abdominal pain.  Past Medical History:  Diagnosis Date  . Diabetes mellitus without complication ([place])   . Hypertension     Past Surgical History:  Procedure Laterality Date  . BACK SURGERY  2014  . HERNIA REPAIR      Family History  Problem Relation Age of Onset  . Heart attack Mother   . Diabetes Mother   . Hypertension Mother   . Heart disease Father   . Diabetes Father   . Hypertension Father   . Diabetes Sister   . Hypertension Sister     Social History:  reports that he has never smoked. He has never used smokeless tobacco. He reports that he does not drink alcohol or use drugs.  Allergies:  Allergies  Allergen Reactions  . Gadolinium Derivatives Itching    Pt began itching across ribs and back and neck s/p Multihance injection    Medications reviewed.   Review of Systems:   Review of Systems  Unable to perform ROS: Language     Physical Exam:  BP (!) 128/109 (BP Location: Left Arm)   Pulse 71   Temp 98 F (36.7 C) (Oral)   Resp 18   Ht 5' 6.93" (1.7 m)   Wt 160 lb (72.6 kg)   SpO2 99%   BMI 25.11 kg/m   Physical Exam  Constitutional: He appears well-developed and well-nourished.  Non-toxic appearance. He does not appear ill. No distress.  Hypertensive  HENT:  Head: Normocephalic and atraumatic.  Eyes: Pupils are equal, round, and reactive to light. EOM are normal.  Cardiovascular: Normal rate and regular rhythm.   Pulmonary/Chest: Effort normal and breath sounds normal.  Abdominal: He exhibits distension. There is no tenderness. There is no rigidity, no rebound and no guarding. A hernia is present.  Palpable umbilical hernia which is soft and nontender and easily reducible.  It is quite small.  Distended and tympanitic but nontender.  Skin: Skin is warm and dry.  Vitals reviewed.     Results for orders placed or performed during the hospital encounter of 01/23/18 (from the past 48 hour(s))  CBC     Status: Abnormal   Collection Time: 01/23/18  8:07 AM  Result Value Ref Range   WBC 7.1 3.8 - 10.6 K/uL   RBC 4.78 4.40 - 5.90 MIL/uL   Hemoglobin 14.0 13.0 - 18.0 g/dL   HCT 39.6 (L) 40.0 - 52.0 %   MCV 82.8 80.0 - 100.0 fL   MCH 29.3 26.0 - 34.0 pg   MCHC 35.4 32.0 - 36.0 g/dL   RDW 13.9 11.5 - 14.5 %   Platelets 183 150 - 440 K/uL    Comment: Performed at [facility], [address] [place], [place] [address]  Comprehensive metabolic panel     Status: Abnormal   Collection Time: 01/23/18  8:07 AM  Result Value Ref Range  Sodium 139 135 - 145 mmol/L   Potassium 4.0 3.5 - 5.1 mmol/L   Chloride 101 98 - 111 mmol/L   CO2 27 22 - 32 mmol/L   Glucose, Bld 333 (H) 70 - 99 mg/dL   BUN 27 (H) 6 - 20 mg/dL   Creatinine, Ser 1.77 (H) 0.61 - 1.24 mg/dL   Calcium 9.2 8.9 - 10.3 mg/dL   Total Protein 6.9 6.5 - 8.1 g/dL   Albumin 4.2 3.5 - 5.0 g/dL   AST 28 15 - 41 U/L   ALT 25 0 - 44 U/L   Alkaline Phosphatase 55 38 - 126 U/L   Total Bilirubin 1.2 0.3 - 1.2 mg/dL   GFR calc non Af Amer 40 (L) >60 mL/min   GFR calc Af Amer 46 (L) >60 mL/min    Comment: (NOTE) The eGFR has been calculated using the CKD EPI equation. This calculation has not been validated in all clinical situations. eGFR's persistently <60 mL/min signify possible Chronic Kidney Disease.    Anion gap 11 5 - 15    Comment: Performed at [facility], [address] [place], [place] [address]  Lipase,  blood     Status: Abnormal   Collection Time: 01/23/18  8:07 AM  Result Value Ref Range   Lipase 271 (H) 11 - 51 U/L    Comment: Performed at [facility], [address] [place], [place] [address]  Urinalysis, Complete w Microscopic     Status: Abnormal   Collection Time: 01/23/18  8:50 AM  Result Value Ref Range   Color, Urine STRAW (A) YELLOW   APPearance CLEAR (A) CLEAR   Specific Gravity, Urine 1.006 1.005 - 1.030   pH 7.0 5.0 - 8.0   Glucose, UA >=500 (A) NEGATIVE mg/dL   Hgb urine dipstick NEGATIVE NEGATIVE   Bilirubin Urine NEGATIVE NEGATIVE   Ketones, ur NEGATIVE NEGATIVE mg/dL   Protein, ur NEGATIVE NEGATIVE mg/dL   Nitrite NEGATIVE NEGATIVE   Leukocytes, UA NEGATIVE NEGATIVE   RBC / HPF 0-5 0 - 5 RBC/hpf   WBC, UA 0-5 0 - 5 WBC/hpf   Bacteria, UA NONE SEEN NONE SEEN   Squamous Epithelial / LPF NONE SEEN 0 - 5    Comment: Performed at [facility], [address] [place], [place] [address]  Lactic acid, plasma     Status: None   Collection Time: 01/23/18  2:58 PM  Result Value Ref Range   Lactic Acid, Venous 1.5 0.5 - 1.9 mmol/L    Comment: Performed at [facility], [address] [place], [place] [address]  Sedimentation rate     Status: None   Collection Time: 01/23/18  2:58 PM  Result Value Ref Range   Sed Rate 13 0 - 20 mm/hr    Comment: Performed at [facility], [address] [place], [place] [address]   US Abdomen Complete  Result Date: 01/23/2018 CLINICAL DATA:  Abdominal pain EXAM: ABDOMEN ULTRASOUND COMPLETE COMPARISON:  CT abdomen pelvis 01/23/2018 FINDINGS: Gallbladder: Multiple small gallstones 4 mm each. Negative sonographic Murphy sign. Gallbladder wall non thickened. Common bile duct: Diameter: 4 mm Liver: Increased echogenicity of the liver diffusely suggesting fatty infiltration. No focal mass lesion. Portal vein is patent on color Doppler imaging with normal direction of blood flow towards the liver. IVC:  No abnormality visualized. Pancreas: Visualized portion unremarkable. Spleen: Size and appearance within normal limits. Right Kidney: Length: 11.0 cm. Echogenicity within normal limits. No mass or  hydronephrosis visualized. Left Kidney: Length: 11.4 cm. Echogenicity within normal limits. No mass or hydronephrosis visualized. Abdominal aorta: No aneurysm visualized. Other findings: None. IMPRESSION: Multiple small gallstones without evidence of acute cholecystitis. No biliary dilatation Fatty liver Electronically Signed   By: Franchot Gallo M.D.   On: 01/23/2018 15:43   Ct Abdomen Pelvis W Contrast  Result Date: 01/23/2018 CLINICAL DATA:  Abdominal pain and swelling. High-grade bowel obstruction EXAM: CT ABDOMEN AND PELVIS WITH CONTRAST TECHNIQUE: Multidetector CT imaging of the abdomen and pelvis was performed using the standard protocol following bolus administration of intravenous contrast. CONTRAST:  55m OMNIPAQUE IOHEXOL 300 MG/ML  SOLN COMPARISON:  10/09/2012 CT FINDINGS: Lower chest:  Trace pericardial fluid, stable from prior. Hepatobiliary: Hepatic steatosis. No focal abnormality.Small dependent gallbladder calculi. No evidence of biliary obstruction or inflammation. Pancreas: Unremarkable. Spleen: Unremarkable. Adrenals/Urinary Tract: Negative adrenals. No hydronephrosis or stone. Unremarkable bladder. Stomach/Bowel:  No obstruction. No appendicitis. Vascular/Lymphatic: No acute vascular abnormality. Fat bulging at the right inguinal ring. Left lower quadrant hernia repair with mesh. Reproductive:No pathologic findings. Other: No ascites or pneumoperitoneum. Minimal haziness of mesenteric fat at the root of the small bowel without mass or adenopathy, likely incidental. Musculoskeletal: No acute abnormalities.  L5-S1 solid arthrodesis. IMPRESSION: 1. No acute finding. 2. Hepatic steatosis. 3. Cholelithiasis. 4. Shallow, fatty right inguinal hernia. Electronically Signed   By: JMonte FantasiaM.D.   On:  01/23/2018 09:39   Dg Abd 2 Views  Result Date: 01/23/2018 CLINICAL DATA:  Pain in abdomen. EXAM: ABDOMEN - 2 VIEW COMPARISON:  None. FINDINGS: The bowel gas pattern is normal. There is no evidence of free air. No radio-opaque calculi or other significant radiographic abnormality is seen. Previous lumbar fusion. IMPRESSION: Unremarkable flat and erect abdomen radiographs. Electronically Signed   By: JStaci RighterM.D.   On: 01/23/2018 14:56    Assessment/Plan:  Vital signs reviewed and stable Hypertensive Labs reviewed and CT scan and KUB personally reviewed.  Labs show no sign of acidosis but mild acute injury to the kidney.  Currently no tenderness and no abdominal pain.  Etiology of the patient's distention is unclear.  On my personal review of the CT scan there is suggestive of hyperperistaltic areas in the small intestine but the patient has had no diarrhea there is considerable stool burden in his colon throughout.  Will await GI input but at this time I see no surgical needs but will continue to follow.  The small inguinal hernia on the right does not show any involvement with bowel etc. and would not reflect the patient's condition at this time.  RFlorene Glen MD, FACS

## 2018-01-23 NOTE — ED Notes (Signed)
Report given to [place], RN.

## 2018-01-23 NOTE — ED Notes (Signed)
Pt reports that he has been having abdominal pain, dysuria and difficulty with passing gas. Pt is AOx4, in bed with rails upx2, call bell is within reach. We will continue to monitor the pt.

## 2018-01-23 NOTE — ED Notes (Signed)
Per Dr. Jacqualine Code, pt provided with water to drink. Pt denies any other needs at this time.

## 2018-01-23 NOTE — H&P (Signed)
[facility] [place] NAME: Daniel Powell    MR#:  [id number]  DATE OF BIRTH:  October 04, 1955  DATE OF ADMISSION:  01/23/2018  PRIMARY CARE PHYSICIAN: Steele Sizer, MD   REQUESTING/REFERRING PHYSICIAN:   CHIEF COMPLAINT:   Chief Complaint  Patient presents with  . Abdominal Pain  . Edema    HISTORY OF PRESENT ILLNESS: Daniel Powell  is a 62 y.o. male with a known history of per below presenting with 4-5 D history of worsening abdominal distention with diffuse mid abdominal pain, patient stated that he had a small bowel movement yesterday, denies nausea/vomiting, has never had this type of pain before, pain axis and wanes, scar is crampy in nature, in the emergency room patient was found to be tachypneic, hypertensive, lipase 270, CT abdomen noted for hepatic steatosis, gallstones, right inguinal hernia, urinalysis negative, creatinine 1.7 with baseline normal, hemoglobin A1c last month was 9.6, patient evaluated in the emergency room, case discussed with multiple family members, patient is now being admitted for acute abdominal pain, acute kidney injury, hepatic steatosis.  PAST MEDICAL HISTORY:   Past Medical History:  Diagnosis Date  . Diabetes mellitus without complication ([place])   . Hypertension     PAST SURGICAL HISTORY:  Past Surgical History:  Procedure Laterality Date  . BACK SURGERY  2014  . HERNIA REPAIR      SOCIAL HISTORY:  Social History   Tobacco Use  . Smoking status: Never Smoker  . Smokeless tobacco: Never Used  Substance Use Topics  . Alcohol use: No    Alcohol/week: 0.0 oz    FAMILY HISTORY:  Family History  Problem Relation Age of Onset  . Heart attack Mother   . Diabetes Mother   . Hypertension Mother   . Heart disease Father   . Diabetes Father   . Hypertension Father   . Diabetes Sister   . Hypertension Sister     DRUG ALLERGIES:  Allergies  Allergen Reactions  . Gadolinium Derivatives Itching    Pt  began itching across ribs and back and neck s/p Multihance injection    REVIEW OF SYSTEMS:   CONSTITUTIONAL: No fever, fatigue or weakness.  EYES: No blurred or double vision.  EARS, NOSE, AND THROAT: No tinnitus or ear pain.  RESPIRATORY: No cough, shortness of breath, wheezing or hemoptysis.  CARDIOVASCULAR: No chest pain, orthopnea, edema.  GASTROINTESTINAL: No nausea, vomiting, diarrhea,+ abdominal pain/distention.  GENITOURINARY: No dysuria, hematuria.  ENDOCRINE: No polyuria, nocturia,  HEMATOLOGY: No anemia, easy bruising or bleeding SKIN: No rash or lesion. MUSCULOSKELETAL: No joint pain or arthritis.   NEUROLOGIC: No tingling, numbness, weakness.  PSYCHIATRY: No anxiety or depression.   MEDICATIONS AT HOME:  Prior to Admission medications   Medication Sig Start Date End Date Taking? Authorizing Provider  atorvastatin (LIPITOR) 20 MG tablet Take 1 tablet (20 mg total) by mouth daily at 6 PM. Take 1 tablet ( 20 mg total ) by mouth at bedtime. 07/09/17  Yes Roselee Nova, MD  Blood Glucose Monitoring Suppl (ACCU-CHEK AVIVA PLUS) w/Device KIT 1 kit by Does not apply route daily. 01/21/18  Yes Sowles, Drue Stager, MD  empagliflozin (JARDIANCE) 25 MG TABS tablet Take 25 mg by mouth daily. 01/20/18  Yes Sowles, Drue Stager, MD  gabapentin (NEURONTIN) 300 MG capsule Take 1 capsule (300 mg total) by mouth at bedtime. 07/09/17  Yes Rochel Brome A, MD  glimepiride (AMARYL) 4 MG tablet TAKE 1 TABLET BY MOUTH  TWICE DAILY AFTER A MEAL 07/09/17  Yes Keith Rake Asad A, MD  glucose blood (ACCU-CHEK AVIVA PLUS) test strip Use as instructed 01/21/18  Yes Sowles, Drue Stager, MD  losartan-hydrochlorothiazide (HYZAAR) 100-12.5 MG tablet Take 1 tablet by mouth daily. 01/02/18  Yes [provider]  metFORMIN (GLUCOPHAGE) 1000 MG tablet Take 1 tablet (1,000 mg total) by mouth 2 (two) times daily with a meal. 07/09/17  Yes Keith Rake Asad A, MD  sitaGLIPtin (JANUVIA) 100 MG tablet Take 1 tablet (100 mg  total) by mouth daily. 07/09/17 01/24/19 Yes Roselee Nova, MD  traZODone (DESYREL) 50 MG tablet Take 0.5-1 tablets (25-50 mg total) by mouth at bedtime as needed for sleep. 01/20/18  Yes Sowles, Drue Stager, MD  FLUoxetine (PROZAC) 20 MG capsule Take 1 capsule (20 mg total) by mouth daily. 01/20/18   Steele Sizer, MD  omega-3 acid ethyl esters (LOVAZA) 1 g capsule Take 2 capsules (2 g total) by mouth 2 (two) times daily. 01/21/18   Steele Sizer, MD      PHYSICAL EXAMINATION:   VITAL SIGNS: Blood pressure (!) 163/85, pulse 86, temperature 97.8 F (36.6 C), temperature source Oral, resp. rate (!) 23, weight 73 kg (161 lb), SpO2 97 %.  GENERAL:  62 y.o.-year-old patient lying in the bed with no acute distress.  EYES: Pupils equal, round, reactive to light and accommodation. No scleral icterus. Extraocular muscles intact.  HEENT: Head atraumatic, normocephalic. Oropharynx and nasopharynx clear.  NECK:  Supple, no jugular venous distention. No thyroid enlargement, no tenderness.  LUNGS: Normal breath sounds bilaterally, no wheezing, rales,rhonchi or crepitation. No use of accessory muscles of respiration.  CARDIOVASCULAR: S1, S2 normal. No murmurs, rubs, or gallops.  ABDOMEN: Soft, nontender, distended, nontympanitic. Bowel sounds present. No organomegaly or mass.  EXTREMITIES: No pedal edema, cyanosis, or clubbing.  NEUROLOGIC: Cranial nerves II through XII are intact. MAES. Sensation intact. Gait not checked.  PSYCHIATRIC: The patient is alert and oriented x 3.  SKIN: No obvious rash, lesion, or ulcer.   LABORATORY PANEL:   CBC Recent Labs  Lab 01/20/18 1632 01/23/18 0807  WBC 5.2 7.1  HGB 14.8 14.0  HCT 43.5 39.6*  PLT 205 183  MCV 82.9 82.8  MCH 28.2 29.3  MCHC 34.0 35.4  RDW 13.3 13.9  LYMPHSABS 1,420  --   EOSABS 172  --   BASOSABS 62  --    ------------------------------------------------------------------------------------------------------------------  Chemistries   Recent Labs  Lab 01/20/18 1632 01/23/18 0807  NA 137 139  K 4.2 4.0  CL 99 101  CO2 28 27  GLUCOSE 237* 333*  BUN 23 27*  CREATININE 1.12 1.77*  CALCIUM 10.3 9.2  AST 15 28  ALT 22 25  ALKPHOS  --  55  BILITOT 1.1 1.2   ------------------------------------------------------------------------------------------------------------------ CrCl cannot be calculated (Unknown ideal weight.). ------------------------------------------------------------------------------------------------------------------ No results for input(s): TSH, T4TOTAL, T3FREE, THYROIDAB in the last 72 hours.  Invalid input(s): FREET3   Coagulation profile No results for input(s): INR, PROTIME in the last 168 hours. ------------------------------------------------------------------------------------------------------------------- No results for input(s): DDIMER in the last 72 hours. -------------------------------------------------------------------------------------------------------------------  Cardiac Enzymes No results for input(s): CKMB, TROPONINI, MYOGLOBIN in the last 168 hours.  Invalid input(s): CK ------------------------------------------------------------------------------------------------------------------ Invalid input(s): POCBNP  ---------------------------------------------------------------------------------------------------------------  Urinalysis    Component Value Date/Time   COLORURINE STRAW (A) 01/23/2018 0850   APPEARANCEUR CLEAR (A) 01/23/2018 0850   APPEARANCEUR Clear 12/13/2012 0245   LABSPEC 1.006 01/23/2018 0850   LABSPEC 1.026 12/13/2012 0245   PHURINE 7.0  01/23/2018 0850   GLUCOSEU >=500 (A) 01/23/2018 0850   GLUCOSEU >=500 12/13/2012 0245   HGBUR NEGATIVE 01/23/2018 0850   BILIRUBINUR NEGATIVE 01/23/2018 0850   BILIRUBINUR Negative 12/13/2012 0245   KETONESUR NEGATIVE 01/23/2018 0850   PROTEINUR NEGATIVE 01/23/2018 0850   NITRITE NEGATIVE 01/23/2018 0850    LEUKOCYTESUR NEGATIVE 01/23/2018 0850   LEUKOCYTESUR Negative 12/13/2012 0245     RADIOLOGY: Ct Abdomen Pelvis W Contrast  Result Date: 01/23/2018 CLINICAL DATA:  Abdominal pain and swelling. High-grade bowel obstruction EXAM: CT ABDOMEN AND PELVIS WITH CONTRAST TECHNIQUE: Multidetector CT imaging of the abdomen and pelvis was performed using the standard protocol following bolus administration of intravenous contrast. CONTRAST:  42m OMNIPAQUE IOHEXOL 300 MG/ML  SOLN COMPARISON:  10/09/2012 CT FINDINGS: Lower chest:  Trace pericardial fluid, stable from prior. Hepatobiliary: Hepatic steatosis. No focal abnormality.Small dependent gallbladder calculi. No evidence of biliary obstruction or inflammation. Pancreas: Unremarkable. Spleen: Unremarkable. Adrenals/Urinary Tract: Negative adrenals. No hydronephrosis or stone. Unremarkable bladder. Stomach/Bowel:  No obstruction. No appendicitis. Vascular/Lymphatic: No acute vascular abnormality. Fat bulging at the right inguinal ring. Left lower quadrant hernia repair with mesh. Reproductive:No pathologic findings. Other: No ascites or pneumoperitoneum. Minimal haziness of mesenteric fat at the root of the small bowel without mass or adenopathy, likely incidental. Musculoskeletal: No acute abnormalities.  L5-S1 solid arthrodesis. IMPRESSION: 1. No acute finding. 2. Hepatic steatosis. 3. Cholelithiasis. 4. Shallow, fatty right inguinal hernia. Electronically Signed   By: JMonte FantasiaM.D.   On: 01/23/2018 09:39    EKG: Orders placed or performed during the hospital encounter of 06/26/16  . EKG 12-Lead  . EKG 12-Lead  . ED EKG within 10 minutes  . ED EKG within 10 minutes  . EKG    IMPRESSION AND PLAN: *Acute abdominal pain with distention Exact etiology unknown but includes possible pancreatitis versus partial bowel obstruction versus cholecystitis versus ileus versus passage of gallstone Admit to regular nursing for bed, NG tube for decompression,  bowel rest, IV fluids rehydration, check acute abdominal series, general surgery consultation given clinical impression most consistent with bowel obstruction versus acute ileus, gastroenterology consultation given hepatic steatosis of unknown etiology, check lactic acid level, sed rate, check abdominal ultrasound, adult pain protocol, continue close medical monitoring CT abdomen noted for right inguinal hernia, hepatic steatosis, gallstones  *Acute renal failure/kidney injury Most likely secondary to above IV fluids rehydration, avoid nephrotoxic agents, BMP in the morning, strict I&O monitoring  *Acute hyperglycemia with chronic diabetes myelitis type II which is uncontrolled IV fluids for rehydration, sliding scale insulin with Accu-Cheks per routine Hemoglobin A1c last month was 9.6  *Chronic benign essential hypertension Lopressor IV, IV hydralazine as needed, vitals per routine, make changes as per necessary  *Acute pancreatitis Plan of care as stated above   All the records are reviewed and case discussed with ED provider. Management plans discussed with the patient, family and they are in agreement.  CODE STATUS:full    TOTAL TIME TAKING CARE OF THIS PATIENT: 45 minutes.    MAvel PeaceSalary M.D on 01/23/2018   Between 7am to 6pm - Pager - [phone] After 6pm go to [email or web address] - password [email or web address]  Office  [phone] CC: Primary care physician; SSteele Sizer MD   Note: This dictation was prepared with Dragon dictation along with smaller phrase technology. Any transcriptional errors that result from this process are unintentional.

## 2018-01-23 NOTE — Progress Notes (Signed)
Family Meeting Note  Advance Directive:yes  Today a meeting took place with the Patient.  Patient is able to participate   The following clinical team members were present during this meeting:MD  The following were discussed:Patient's diagnosis: Abdominal pain, hepatic steatosis, gallstones, Patient's progosis: Unable to determine and Goals for treatment: Full Code  Additional follow-up to be provided: prn  Time spent during discussion:20 minutes  Gorden Harms, MD

## 2018-01-23 NOTE — ED Notes (Signed)
KF/MG did bladder scan found 27ml of urine.

## 2018-01-23 NOTE — ED Notes (Signed)
Patient did not tolerate the insertion of the foley. Insertion was stopped due to patient being in severe pain. Dwaine Deter, MD aware.

## 2018-01-23 NOTE — ED Provider Notes (Signed)
[facility] Emergency Department Provider Note   ____________________________________________   First MD Initiated Contact with Patient 01/23/18 [phone]     (approximate)  I have reviewed the triage vital signs and the nursing notes.   HISTORY  Chief Complaint Abdominal Pain and Edema  And an interpreter, Arabic utilized via tele-interpreter services  HPI Daniel Powell is a 62 y.o. male reports for about at least 4 to 5 days he had increasing swelling in his entire abdomen.  Reports he told his diabetes doctor about a couple of days ago and he has an ultrasound scheduled, but the pain and swelling is gotten worse.  He now describes his stomach is feeling "rock hard" and severely painful.  The pain in his stomach started a few days ago throughout the stomach with swelling, but he also noticed that today's felt an urge to urinate and also cannot get his bladder to empty he thinks because his stomach so swollen  No fevers or chills.  Some nausea but no vomiting.  Last bowel movement he reports was very small probably a day ago, but not quite sure.  Does report a previous history of a hernia surgery on the abdomen.  No chest pain or trouble breathing.  Reports 10 out of 10 severe pain with a "rock hard abdomen".  Past Medical History:  Diagnosis Date  . Diabetes mellitus without complication ([place])   . Hypertension     Patient Active Problem List   Diagnosis Date Noted  . Major depression, recurrent ([place]) 01/20/2018  . HTN (hypertension), benign 04/03/2017  . Hypertriglyceridemia 07/27/2015  . Diabetes mellitus type 2, uncontrolled ([place]) 03/01/2015  . Dyslipidemia 03/01/2015  . Back pain, thoracic 03/01/2015  . Persistent headaches 03/01/2015  . Folliculitis 69/62/9528  . Fracture of lumbar spine ([place]) 11/05/2013    Past Surgical History:  Procedure Laterality Date  . BACK SURGERY  2014  . HERNIA REPAIR      Prior to Admission medications     Medication Sig Start Date End Date Taking? Authorizing Provider  atorvastatin (LIPITOR) 20 MG tablet Take 1 tablet (20 mg total) by mouth daily at 6 PM. Take 1 tablet ( 20 mg total ) by mouth at bedtime. 07/09/17   Roselee Nova, MD  Blood Glucose Monitoring Suppl (ACCU-CHEK AVIVA PLUS) w/Device KIT 1 kit by Does not apply route daily. 01/21/18   Steele Sizer, MD  empagliflozin (JARDIANCE) 25 MG TABS tablet Take 25 mg by mouth daily. 01/20/18   Steele Sizer, MD  FLUoxetine (PROZAC) 20 MG capsule Take 1 capsule (20 mg total) by mouth daily. 01/20/18   Steele Sizer, MD  gabapentin (NEURONTIN) 300 MG capsule Take 1 capsule (300 mg total) by mouth at bedtime. 07/09/17   Roselee Nova, MD  glimepiride (AMARYL) 4 MG tablet TAKE 1 TABLET BY MOUTH TWICE DAILY AFTER A MEAL 07/09/17   Rochel Brome A, MD  glucose blood (ACCU-CHEK AVIVA PLUS) test strip Use as instructed 01/21/18   Steele Sizer, MD  losartan-hydrochlorothiazide (HYZAAR) 100-12.5 MG tablet Take 1 tablet by mouth daily. 01/02/18   [provider]  metFORMIN (GLUCOPHAGE) 1000 MG tablet Take 1 tablet (1,000 mg total) by mouth 2 (two) times daily with a meal. 07/09/17   Roselee Nova, MD  omega-3 acid ethyl esters (LOVAZA) 1 g capsule Take 2 capsules (2 g total) by mouth 2 (two) times daily. 01/21/18   Steele Sizer, MD  sitaGLIPtin (JANUVIA) 100 MG  tablet Take 1 tablet (100 mg total) by mouth daily. 07/09/17 10/07/17  Roselee Nova, MD  traZODone (DESYREL) 50 MG tablet Take 0.5-1 tablets (25-50 mg total) by mouth at bedtime as needed for sleep. 01/20/18   Steele Sizer, MD    Allergies Gadolinium derivatives  Family History  Problem Relation Age of Onset  . Heart attack Mother   . Diabetes Mother   . Hypertension Mother   . Heart disease Father   . Diabetes Father   . Hypertension Father   . Diabetes Sister   . Hypertension Sister     Social History Social History   Tobacco Use  . Smoking status:  Never Smoker  . Smokeless tobacco: Never Used  Substance Use Topics  . Alcohol use: No    Alcohol/week: 0.0 oz  . Drug use: No    Review of Systems Constitutional: No fever/chills Eyes: No visual changes. ENT: No sore throat. Cardiovascular: Denies chest pain. Respiratory: Denies shortness of breath. Gastrointestinal: No vomiting.  No diarrhea.  No pain around the rectum. Genitourinary: Unable to urinate for about the last 12 hours.  Denies trouble urinating previous to that, feels like his stomach so swollen he cannot urinate. Musculoskeletal: Negative for back pain. Skin: Negative for rash. Neurological: Negative for headaches, focal weakness or numbness.    ____________________________________________   PHYSICAL EXAM:  VITAL SIGNS: ED Triage Vitals  Enc Vitals Group     BP 01/23/18 0721 (!) 156/65     Pulse Rate 01/23/18 0721 89     Resp 01/23/18 0721 18     Temp 01/23/18 0731 97.8 F (36.6 C)     Temp Source 01/23/18 0731 Oral     SpO2 01/23/18 0721 98 %     Weight 01/23/18 0732 161 lb (73 kg)     Height --      Head Circumference --      Peak Flow --      Pain Score 01/23/18 0731 8     Pain Loc --      Pain Edu? --      Excl. in [place]? --     Constitutional: Alert and oriented.  Moderately ill-appearing, in no distress but appears in pain.  Holding his hands over his abdomen. Eyes: Conjunctivae are normal. Head: Atraumatic. Nose: No congestion/rhinnorhea. Mouth/Throat: Mucous membranes are moist. Neck: No stridor.   Cardiovascular: Normal rate, regular rhythm. Grossly normal heart sounds.  Good peripheral circulation. Respiratory: Normal respiratory effort.  No retractions. Lungs CTAB. Gastrointestinal: Diffusely distended.  Hard to touch and tympanitic.  Tender throughout all quadrants.  No obvious palpable mass but his entire stomach does feel quite distended and tender.  No frank peritonitis. Musculoskeletal: No lower extremity tenderness nor  edema. Neurologic:  Normal speech and language. No gross focal neurologic deficits are appreciated.  Skin:  Skin is warm, dry and intact. No rash noted. Psychiatric: Mood and affect are normal. Speech and behavior are normal.  ____________________________________________   LABS (all labs ordered are listed, but only abnormal results are displayed)  Labs Reviewed  CBC - Abnormal; Notable for the following components:      Result Value   HCT 39.6 (*)    All other components within normal limits  COMPREHENSIVE METABOLIC PANEL - Abnormal; Notable for the following components:   Glucose, Bld 333 (*)    BUN 27 (*)    Creatinine, Ser 1.77 (*)    GFR calc non Af Amer 40 (*)  GFR calc Af Amer 46 (*)    All other components within normal limits  LIPASE, BLOOD - Abnormal; Notable for the following components:   Lipase 271 (*)    All other components within normal limits  URINALYSIS, COMPLETE (UACMP) WITH MICROSCOPIC - Abnormal; Notable for the following components:   Color, Urine STRAW (*)    APPearance CLEAR (*)    Glucose, UA >=500 (*)    All other components within normal limits   ____________________________________________  EKG   ____________________________________________  RADIOLOGY  Ct Abdomen Pelvis W Contrast  Result Date: 01/23/2018 CLINICAL DATA:  Abdominal pain and swelling. High-grade bowel obstruction EXAM: CT ABDOMEN AND PELVIS WITH CONTRAST TECHNIQUE: Multidetector CT imaging of the abdomen and pelvis was performed using the standard protocol following bolus administration of intravenous contrast. CONTRAST:  21m OMNIPAQUE IOHEXOL 300 MG/ML  SOLN COMPARISON:  10/09/2012 CT FINDINGS: Lower chest:  Trace pericardial fluid, stable from prior. Hepatobiliary: Hepatic steatosis. No focal abnormality.Small dependent gallbladder calculi. No evidence of biliary obstruction or inflammation. Pancreas: Unremarkable. Spleen: Unremarkable. Adrenals/Urinary Tract: Negative adrenals.  No hydronephrosis or stone. Unremarkable bladder. Stomach/Bowel:  No obstruction. No appendicitis. Vascular/Lymphatic: No acute vascular abnormality. Fat bulging at the right inguinal ring. Left lower quadrant hernia repair with mesh. Reproductive:No pathologic findings. Other: No ascites or pneumoperitoneum. Minimal haziness of mesenteric fat at the root of the small bowel without mass or adenopathy, likely incidental. Musculoskeletal: No acute abnormalities.  L5-S1 solid arthrodesis. IMPRESSION: 1. No acute finding. 2. Hepatic steatosis. 3. Cholelithiasis. 4. Shallow, fatty right inguinal hernia. Electronically Signed   By: JMonte FantasiaM.D.   On: 01/23/2018 09:39      CT scan result reviewed by me ____________________________________________   PROCEDURES  Procedure(s) performed: None  Procedures  Critical Care performed: No  ____________________________________________   INITIAL IMPRESSION / ASSESSMENT AND PLAN / ED COURSE  Pertinent labs & imaging results that were available during my care of the patient were reviewed by me and considered in my medical decision making (see chart for details).  Differential diagnosis includes but is not limited to, abdominal perforation, aortic dissection, cholecystitis, appendicitis, diverticulitis, colitis, esophagitis/gastritis, kidney stone, pyelonephritis, urinary tract infection, aortic aneurysm. All are considered in decision and treatment plan. Based upon the patient's presentation and risk factors, I am highly concerned the patient may have developed an ileus, bowel obstruction internal hernia or other acute obstructing pathology.  Symptoms seem to have progressed over this last several days, I do not feel that this represents dissection or ruptured acute aneurysm but feel more likely this represents a bowel obstruction or other acute obstructive process.  Seems less likely infectious.  Will place a Foley catheter is unable to urinate, and I  suspect this is secondary to the distention of the abdomen rather than an acute urinary process based on the clinical presentation and history.  ----------------------------------------- 1:36 PM on 01/23/2018 -----------------------------------------  Patient reports his pain was better, but is coming back now.  Continues to feel nauseated.  In review of his creatinines in the past he does have some mild acute kidney injury as well as mildly elevated lipase question if this could be gallstone related potential mild pancreatitis.  Given the ongoing nature and the fact that his pain is returning, continues to feel uncomfortable, and does not feel he can heal along with some acute kidney injury we will admit for further treatment, pain control and hydration.  Discussed case with Dr. SJerelyn Charles       ____________________________________________  FINAL CLINICAL IMPRESSION(S) / ED DIAGNOSES  Final diagnoses:  Acute pancreatitis, unspecified complication status, unspecified pancreatitis type  Intractable abdominal pain  AKI (acute kidney injury) ([place])      NEW MEDICATIONS STARTED DURING THIS VISIT:  New Prescriptions   No medications on file     Note:  This document was prepared using Dragon voice recognition software and may include unintentional dictation errors.     Delman Kitten, MD 01/23/18 [phone]

## 2018-01-24 ENCOUNTER — Encounter: Payer: Self-pay | Admitting: Family Medicine

## 2018-01-24 DIAGNOSIS — K760 Fatty (change of) liver, not elsewhere classified: Secondary | ICD-10-CM | POA: Insufficient documentation

## 2018-01-24 DIAGNOSIS — K8020 Calculus of gallbladder without cholecystitis without obstruction: Secondary | ICD-10-CM | POA: Insufficient documentation

## 2018-01-24 DIAGNOSIS — K4090 Unilateral inguinal hernia, without obstruction or gangrene, not specified as recurrent: Secondary | ICD-10-CM | POA: Insufficient documentation

## 2018-01-24 LAB — CBC
HCT: 36.5 % — ABNORMAL LOW (ref 40.0–52.0)
Hemoglobin: 13 g/dL (ref 13.0–18.0)
MCH: 29.5 pg (ref 26.0–34.0)
MCHC: 35.6 g/dL (ref 32.0–36.0)
MCV: 83 fL (ref 80.0–100.0)
PLATELETS: 174 10*3/uL (ref 150–440)
RBC: 4.39 MIL/uL — AB (ref 4.40–5.90)
RDW: 13.8 % (ref 11.5–14.5)
WBC: 5.6 10*3/uL (ref 3.8–10.6)

## 2018-01-24 LAB — COMPREHENSIVE METABOLIC PANEL
ALBUMIN: 3.6 g/dL (ref 3.5–5.0)
ALT: 20 U/L (ref 0–44)
ANION GAP: 7 (ref 5–15)
AST: 20 U/L (ref 15–41)
Alkaline Phosphatase: 38 U/L (ref 38–126)
BUN: 18 mg/dL (ref 6–20)
CO2: 29 mmol/L (ref 22–32)
Calcium: 8.4 mg/dL — ABNORMAL LOW (ref 8.9–10.3)
Chloride: 103 mmol/L (ref 98–111)
Creatinine, Ser: 1.06 mg/dL (ref 0.61–1.24)
GFR calc Af Amer: >60 mL/min (ref >60)
GFR calc non Af Amer: >60 mL/min (ref >60)
GLUCOSE: 84 mg/dL (ref 70–99)
POTASSIUM: 3.8 mmol/L (ref 3.5–5.1)
SODIUM: 139 mmol/L (ref 135–145)
Total Bilirubin: 1.4 mg/dL — ABNORMAL HIGH (ref 0.3–1.2)
Total Protein: 6.2 g/dL — ABNORMAL LOW (ref 6.5–8.1)

## 2018-01-24 LAB — GLUCOSE, CAPILLARY
GLUCOSE-CAPILLARY: 83 mg/dL (ref 70–99)
GLUCOSE-CAPILLARY: 93 mg/dL (ref 70–99)
Glucose-Capillary: 79 mg/dL (ref 70–99)

## 2018-01-24 LAB — LIPASE, BLOOD: LIPASE: 83 U/L — AB (ref 11–51)

## 2018-01-24 NOTE — Consult Note (Addendum)
Daniel Powell , MD [address], [place], [place], [place], [address] [address] [place], [place], [place], [address] Phone: [phone]  Fax: [phone]  Consultation  Referring Provider:  Dr Benjie Karvonen Primary Care Physician:  Steele Sizer, MD Primary Gastroenterologist: None          Reason for Consultation:     Hepatic steatosis seen on CT scan of the abdomen   Date of Admission:  01/23/2018 Date of Consultation:  01/24/2018         HPI:   Daniel Powell is a 62 y.o. male admitted on 01/23/2018 with abdominal pain/distention.  In the emergency room he underwent a CT scan of the abdomen that demonstrated no acute findings, hepatic steatosis noted cholelithiasis noted and fatty right inguinal hernia noted.  He also had an x-ray of his abdomen which showed no abnormalities.  Ultrasound of the abdomen showed multiple gallstones without evidence of acute cholecystitis or biliary dilation.  Fatty liver noted.  Lipase elevated at 271.  Glucose greater than 500 in the urine.  Serum glucose 333.  Creatinine 1.77.  Transaminases normal.  Total bilirubin 1.4.   I have been consulted for hepatic steatosis noted on his CT scan.  I do note that Dr. Burt Knack has been consulted for abdominal distention.  When Dr. Burt Knack did see him yesterday he had no abdominal pain or tenderness on examination.Marland Kitchen  He states he developed some abdominal distention of one day duration with some pain denies any nausea vomiting denies regular NSAID use.  He says he is not passing much gas and has not had a bowel movement.  He states he is much better today and the abdominal distention has improved along with the pain.  Denies any alcohol abuse or excess consumption in the past denies any smoking as well. Past Medical History:  Diagnosis Date  . Diabetes mellitus without complication (Daniel Powell)   . Hypertension     Past Surgical History:  Procedure Laterality Date  . BACK SURGERY  2014  . HERNIA REPAIR      Prior to  Admission medications   Medication Sig Start Date End Date Taking? Authorizing Provider  atorvastatin (LIPITOR) 20 MG tablet Take 1 tablet (20 mg total) by mouth daily at 6 PM. Take 1 tablet ( 20 mg total ) by mouth at bedtime. 07/09/17  Yes Roselee Nova, MD  Blood Glucose Monitoring Suppl (ACCU-CHEK AVIVA PLUS) w/Device KIT 1 kit by Does not apply route daily. 01/21/18  Yes Sowles, Drue Stager, MD  empagliflozin (JARDIANCE) 25 MG TABS tablet Take 25 mg by mouth daily. 01/20/18  Yes Sowles, Drue Stager, MD  FLUoxetine (PROZAC) 20 MG capsule Take 1 capsule (20 mg total) by mouth daily. 01/20/18  Yes Sowles, Drue Stager, MD  gabapentin (NEURONTIN) 300 MG capsule Take 1 capsule (300 mg total) by mouth at bedtime. 07/09/17  Yes Rochel Brome A, MD  glimepiride (AMARYL) 4 MG tablet TAKE 1 TABLET BY MOUTH TWICE DAILY AFTER A MEAL 07/09/17  Yes Keith Rake Asad A, MD  glucose blood (ACCU-CHEK AVIVA PLUS) test strip Use as instructed 01/21/18  Yes Sowles, Drue Stager, MD  losartan-hydrochlorothiazide (HYZAAR) 100-12.5 MG tablet Take 1 tablet by mouth daily. 01/02/18  Yes [provider]  metFORMIN (GLUCOPHAGE) 1000 MG tablet Take 1 tablet (1,000 mg total) by mouth 2 (two) times daily with a meal. 07/09/17  Yes Keith Rake Asad A, MD  sitaGLIPtin (JANUVIA) 100 MG tablet Take 1 tablet (100 mg total) by mouth  daily. 07/09/17 01/24/19 Yes Roselee Nova, MD  traZODone (DESYREL) 50 MG tablet Take 0.5-1 tablets (25-50 mg total) by mouth at bedtime as needed for sleep. 01/20/18  Yes Sowles, Drue Stager, MD  omega-3 acid ethyl esters (LOVAZA) 1 g capsule Take 2 capsules (2 g total) by mouth 2 (two) times daily. Patient not taking: Reported on 01/23/2018 01/21/18   Steele Sizer, MD    Family History  Problem Relation Age of Onset  . Heart attack Mother   . Diabetes Mother   . Hypertension Mother   . Heart disease Father   . Diabetes Father   . Hypertension Father   . Diabetes Sister   . Hypertension Sister      Social  History   Tobacco Use  . Smoking status: Never Smoker  . Smokeless tobacco: Never Used  Substance Use Topics  . Alcohol use: No    Alcohol/week: 0.0 oz  . Drug use: No    Allergies as of 01/23/2018 - Review Complete 01/23/2018  Allergen Reaction Noted  . Gadolinium derivatives Itching 10/28/2014   Body mass index is 25.11 kg/m.  Review of Systems:    All systems reviewed and negative except where noted in HPI.   Physical Exam:  Vital signs in last 24 hours: Temp:  [97.7 F (36.5 C)-98.3 F (36.8 C)] 97.7 F (36.5 C) (08/03 0457) Pulse Rate:  [57-94] 57 (08/03 0457) Resp:  [11-23] 16 (08/03 0457) BP: (121-165)/(61-109) 121/65 (08/03 0457) SpO2:  [97 %-99 %] 99 % (08/03 0457) Weight:  [160 lb (72.6 kg)] 160 lb (72.6 kg) (08/02 1647) Last BM Date: 01/21/18 General:   Pleasant, cooperative in NAD Head:  Normocephalic and atraumatic. Eyes:   No icterus.   Conjunctiva pink. PERRLA. Ears:  Normal auditory acuity. Neck:  Supple; no masses or thyroidomegaly Lungs: Respirations even and unlabored. Lungs clear to auscultation bilaterally.   No wheezes, crackles, or rhonchi.  Heart:  Regular rate and rhythm;  Without murmur, clicks, rubs or gallops Abdomen:  Soft, nondistended, nontender. Normal bowel sounds. No appreciable masses or hepatomegaly.  No rebound or guarding.  Neurologic:  Alert and oriented x3;  grossly normal neurologically. Skin:  Intact without significant lesions or rashes. Cervical Nodes:  No significant cervical adenopathy. Psych:  Alert and cooperative. Normal affect.  LAB RESULTS: Recent Labs    01/23/18 0807 01/24/18 0526  WBC 7.1 5.6  HGB 14.0 13.0  HCT 39.6* 36.5*  PLT 183 174   BMET Recent Labs    01/23/18 0807 01/24/18 0526  NA 139 139  K 4.0 3.8  CL 101 103  CO2 27 29  GLUCOSE 333* 84  BUN 27* 18  CREATININE 1.77* 1.06  CALCIUM 9.2 8.4*   LFT Recent Labs    01/24/18 0526  PROT 6.2*  ALBUMIN 3.6  AST 20  ALT 20  ALKPHOS 38    BILITOT 1.4*   Past Medical History:  Diagnosis Date  . Diabetes mellitus without complication ([place])   . Hypertension     PT/INR No results for input(s): LABPROT, INR in the last 72 hours.  STUDIES: US Abdomen Complete  Result Date: 01/23/2018 CLINICAL DATA:  Abdominal pain EXAM: ABDOMEN ULTRASOUND COMPLETE COMPARISON:  CT abdomen pelvis 01/23/2018 FINDINGS: Gallbladder: Multiple small gallstones 4 mm each. Negative sonographic Murphy sign. Gallbladder wall non thickened. Common bile duct: Diameter: 4 mm Liver: Increased echogenicity of the liver diffusely suggesting fatty infiltration. No focal mass lesion. Portal vein is patent on color Doppler imaging  with normal direction of blood flow towards the liver. IVC: No abnormality visualized. Pancreas: Visualized portion unremarkable. Spleen: Size and appearance within normal limits. Right Kidney: Length: 11.0 cm. Echogenicity within normal limits. No mass or hydronephrosis visualized. Left Kidney: Length: 11.4 cm. Echogenicity within normal limits. No mass or hydronephrosis visualized. Abdominal aorta: No aneurysm visualized. Other findings: None. IMPRESSION: Multiple small gallstones without evidence of acute cholecystitis. No biliary dilatation Fatty liver Electronically Signed   By: Franchot Gallo M.D.   On: 01/23/2018 15:43   Ct Abdomen Pelvis W Contrast  Result Date: 01/23/2018 CLINICAL DATA:  Abdominal pain and swelling. High-grade bowel obstruction EXAM: CT ABDOMEN AND PELVIS WITH CONTRAST TECHNIQUE: Multidetector CT imaging of the abdomen and pelvis was performed using the standard protocol following bolus administration of intravenous contrast. CONTRAST:  73m OMNIPAQUE IOHEXOL 300 MG/ML  SOLN COMPARISON:  10/09/2012 CT FINDINGS: Lower chest:  Trace pericardial fluid, stable from prior. Hepatobiliary: Hepatic steatosis. No focal abnormality.Small dependent gallbladder calculi. No evidence of biliary obstruction or inflammation. Pancreas:  Unremarkable. Spleen: Unremarkable. Adrenals/Urinary Tract: Negative adrenals. No hydronephrosis or stone. Unremarkable bladder. Stomach/Bowel:  No obstruction. No appendicitis. Vascular/Lymphatic: No acute vascular abnormality. Fat bulging at the right inguinal ring. Left lower quadrant hernia repair with mesh. Reproductive:No pathologic findings. Other: No ascites or pneumoperitoneum. Minimal haziness of mesenteric fat at the root of the small bowel without mass or adenopathy, likely incidental. Musculoskeletal: No acute abnormalities.  L5-S1 solid arthrodesis. IMPRESSION: 1. No acute finding. 2. Hepatic steatosis. 3. Cholelithiasis. 4. Shallow, fatty right inguinal hernia. Electronically Signed   By: JMonte FantasiaM.D.   On: 01/23/2018 09:39   Dg Abd 2 Views  Result Date: 01/23/2018 CLINICAL DATA:  Pain in abdomen. EXAM: ABDOMEN - 2 VIEW COMPARISON:  None. FINDINGS: The bowel gas pattern is normal. There is no evidence of free air. No radio-opaque calculi or other significant radiographic abnormality is seen. Previous lumbar fusion. IMPRESSION: Unremarkable flat and erect abdomen radiographs. Electronically Signed   By: JStaci RighterM.D.   On: 01/23/2018 14:56      Impression / Plan:   MFRANCIS DOENGESis a 62y.o. y/o male presented to the emergency room with abdominal distention, pain.  It appears that the pain subsequently resolved.  CT scan, ultrasound, x-ray of the abdomen did not demonstrate any clear cause for any of his symptoms.  I have been consulted for the incidental finding of hepatic steatosis on his CT scan of the abdomen.  With a known history of hypertension and diabetes it is very possible that his hepatic steatosis is  part of a metabolic syndrome.  He requires no inpatient acute management.  The mainstay of treatment in a diabetic with hepatic steatosis with normal liver function tests would be addressing his cardiovascular risk factors in terms of weight loss, tight glycemic control,  exercise and management of his lipids.  All of these could be addressed as an outpatient.    In addition I note that his lipase is elevated which could be from gastritis or many other causes.  There is no evidence of pancreatitis on his CT scan and his abdominal pain had resolved yesterday within a short period of time.  The other causes of abdominal distention could be gastroparesis from hyper glycemia [suggest tight glycemic control].  Gaseous bloating and distention can also be caused by consumption of artificial sugars, carbohydrate malabsorption.  For this I would suggest to  use a diet low in sugar natural and artificial ,  avoid any diet sodas, , could try charcoal tablets to absorb any gases.  If he has worsening of abdominal pain or tenderness suggest repeat imaging.    There is no evidence of cholecystitis on his imaging, there is no evidence biochemically of him passing a gallstone.  His common bile duct is of normal dimensions on his ultrasound as well.  Consider a laxative to help him move his bowels.  Consider a PPI to treat empirically any gastritis.   I will sign off.  Please call me if any further GI concerns or questions.  We would like to thank you for the opportunity to participate in the care of BRANDON WIECHMAN.   Thank you for involving me in the care of this patient.      LOS: 1 day   Daniel Bellows, MD  01/24/2018, 9:31 AM

## 2018-01-24 NOTE — Discharge Summary (Signed)
[facility] NAME: Daniel Powell    MR#:  [id number]  DATE OF BIRTH:  10/17/1955  DATE OF ADMISSION:  01/23/2018 ADMITTING PHYSICIAN: Avel Peace Salary, MD  DATE OF DISCHARGE: 01/24/2018  PRIMARY CARE PHYSICIAN: Steele Sizer, MD    ADMISSION DIAGNOSIS:  Abdominal distention [R14.0] AKI (acute kidney injury) ([place]) [N17.9] Intractable abdominal pain [R10.9] Abdominal pain [R10.9] Acute pancreatitis, unspecified complication status, unspecified pancreatitis type [K85.90]  DISCHARGE DIAGNOSIS:  Active Problems:   Abdominal pain   Abdominal distention   Acute pancreatitis   SECONDARY DIAGNOSIS:   Past Medical History:  Diagnosis Date  . Diabetes mellitus without complication ([place])   . Hypertension     HOSPITAL COURSE:   62 year old male with history of diabetes who presented with abdominal distention.  1.  Abdominal distention/pain: Patient's pain has subsided.  Patient reports that he had passed a lot of gas while walking and after that his abdominal pain had subsided.  CT scan showed no acute pathology.  Patient was evaluated by surgery and GI.  No further work-up at this time. Of note patient had no pancreatitis. 2  diabetes: Patient will continue outpatient medications with ADA diet 3.  Essential hypertension: Continue Hyzaar  4.  Depression: Continue fluoxetine 5.  Acute kidney injury: This has improved with IV fluids     DISCHARGE CONDITIONS AND DIET:  Stable for discharge on diabetic diet  CONSULTS OBTAINED:  Treatment Team:  Florene Glen, MD Jonathon Bellows, MD  DRUG ALLERGIES:   Allergies  Allergen Reactions  . Gadolinium Derivatives Itching    Pt began itching across ribs and back and neck s/p Multihance injection    DISCHARGE MEDICATIONS:   Allergies as of 01/24/2018      Reactions   Gadolinium Derivatives Itching   Pt began itching across ribs and back and neck s/p Multihance injection       Medication List    STOP taking these medications   omega-3 acid ethyl esters 1 g capsule Commonly known as:  LOVAZA     TAKE these medications   ACCU-CHEK AVIVA PLUS w/Device Kit 1 kit by Does not apply route daily.   atorvastatin 20 MG tablet Commonly known as:  LIPITOR Take 1 tablet (20 mg total) by mouth daily at 6 PM. Take 1 tablet ( 20 mg total ) by mouth at bedtime.   empagliflozin 25 MG Tabs tablet Commonly known as:  JARDIANCE Take 25 mg by mouth daily.   FLUoxetine 20 MG capsule Commonly known as:  PROZAC Take 1 capsule (20 mg total) by mouth daily.   gabapentin 300 MG capsule Commonly known as:  NEURONTIN Take 1 capsule (300 mg total) by mouth at bedtime.   glimepiride 4 MG tablet Commonly known as:  AMARYL TAKE 1 TABLET BY MOUTH TWICE DAILY AFTER A MEAL   glucose blood test strip Commonly known as:  ACCU-CHEK AVIVA PLUS Use as instructed   losartan-hydrochlorothiazide 100-12.5 MG tablet Commonly known as:  HYZAAR Take 1 tablet by mouth daily.   metFORMIN 1000 MG tablet Commonly known as:  GLUCOPHAGE Take 1 tablet (1,000 mg total) by mouth 2 (two) times daily with a meal.   sitaGLIPtin 100 MG tablet Commonly known as:  JANUVIA Take 1 tablet (100 mg total) by mouth daily.   traZODone 50 MG tablet Commonly known as:  DESYREL Take 0.5-1 tablets (25-50 mg total) by mouth at bedtime as needed for sleep.  Today   CHIEF COMPLAINT:  No acute issues overnight.  Abdominal pain is subsided.  Patient would like to eat.   VITAL SIGNS:  Blood pressure 121/65, pulse (!) 57, temperature 97.7 F (36.5 C), temperature source Oral, resp. rate 16, height 5' 6.93" (1.7 m), weight 160 lb (72.6 kg), SpO2 99 %.   REVIEW OF SYSTEMS:  Review of Systems  Constitutional: Negative.  Negative for chills, fever and malaise/fatigue.  HENT: Negative.  Negative for ear discharge, ear pain, hearing loss, nosebleeds and sore throat.   Eyes: Negative.  Negative  for blurred vision and pain.  Respiratory: Negative.  Negative for cough, hemoptysis, shortness of breath and wheezing.   Cardiovascular: Negative.  Negative for chest pain, palpitations and leg swelling.  Gastrointestinal: Negative.  Negative for abdominal pain, blood in stool, diarrhea, nausea and vomiting.  Genitourinary: Negative.  Negative for dysuria.  Musculoskeletal: Negative.  Negative for back pain.  Skin: Negative.   Neurological: Negative for dizziness, tremors, speech change, focal weakness, seizures and headaches.  Endo/Heme/Allergies: Negative.  Does not bruise/bleed easily.  Psychiatric/Behavioral: Negative.  Negative for depression, hallucinations and suicidal ideas.     PHYSICAL EXAMINATION:  GENERAL:  62 y.o.-year-old patient lying in the bed with no acute distress.  NECK:  Supple, no jugular venous distention. No thyroid enlargement, no tenderness.  LUNGS: Normal breath sounds bilaterally, no wheezing, rales,rhonchi  No use of accessory muscles of respiration.  CARDIOVASCULAR: S1, S2 normal. No murmurs, rubs, or gallops.  ABDOMEN: Soft, non-tender, non-distended. Bowel sounds present. No organomegaly or mass.  EXTREMITIES: No pedal edema, cyanosis, or clubbing.  PSYCHIATRIC: The patient is alert and oriented x 3.  SKIN: No obvious rash, lesion, or ulcer.   DATA REVIEW:   CBC Recent Labs  Lab 01/24/18 0526  WBC 5.6  HGB 13.0  HCT 36.5*  PLT 174    Chemistries  Recent Labs  Lab 01/24/18 0526  NA 139  K 3.8  CL 103  CO2 29  GLUCOSE 84  BUN 18  CREATININE 1.06  CALCIUM 8.4*  AST 20  ALT 20  ALKPHOS 38  BILITOT 1.4*    Cardiac Enzymes No results for input(s): TROPONINI in the last 168 hours.  Microbiology Results  @MICRORSLT48 @  RADIOLOGY:  US Abdomen Complete  Result Date: 01/23/2018 CLINICAL DATA:  Abdominal pain EXAM: ABDOMEN ULTRASOUND COMPLETE COMPARISON:  CT abdomen pelvis 01/23/2018 FINDINGS: Gallbladder: Multiple small gallstones 4  mm each. Negative sonographic Murphy sign. Gallbladder wall non thickened. Common bile duct: Diameter: 4 mm Liver: Increased echogenicity of the liver diffusely suggesting fatty infiltration. No focal mass lesion. Portal vein is patent on color Doppler imaging with normal direction of blood flow towards the liver. IVC: No abnormality visualized. Pancreas: Visualized portion unremarkable. Spleen: Size and appearance within normal limits. Right Kidney: Length: 11.0 cm. Echogenicity within normal limits. No mass or hydronephrosis visualized. Left Kidney: Length: 11.4 cm. Echogenicity within normal limits. No mass or hydronephrosis visualized. Abdominal aorta: No aneurysm visualized. Other findings: None. IMPRESSION: Multiple small gallstones without evidence of acute cholecystitis. No biliary dilatation Fatty liver Electronically Signed   By: Franchot Gallo M.D.   On: 01/23/2018 15:43   Ct Abdomen Pelvis W Contrast  Result Date: 01/23/2018 CLINICAL DATA:  Abdominal pain and swelling. High-grade bowel obstruction EXAM: CT ABDOMEN AND PELVIS WITH CONTRAST TECHNIQUE: Multidetector CT imaging of the abdomen and pelvis was performed using the standard protocol following bolus administration of intravenous contrast. CONTRAST:  33m OMNIPAQUE IOHEXOL 300 MG/ML  SOLN COMPARISON:  10/09/2012 CT FINDINGS: Lower chest:  Trace pericardial fluid, stable from prior. Hepatobiliary: Hepatic steatosis. No focal abnormality.Small dependent gallbladder calculi. No evidence of biliary obstruction or inflammation. Pancreas: Unremarkable. Spleen: Unremarkable. Adrenals/Urinary Tract: Negative adrenals. No hydronephrosis or stone. Unremarkable bladder. Stomach/Bowel:  No obstruction. No appendicitis. Vascular/Lymphatic: No acute vascular abnormality. Fat bulging at the right inguinal ring. Left lower quadrant hernia repair with mesh. Reproductive:No pathologic findings. Other: No ascites or pneumoperitoneum. Minimal haziness of mesenteric  fat at the root of the small bowel without mass or adenopathy, likely incidental. Musculoskeletal: No acute abnormalities.  L5-S1 solid arthrodesis. IMPRESSION: 1. No acute finding. 2. Hepatic steatosis. 3. Cholelithiasis. 4. Shallow, fatty right inguinal hernia. Electronically Signed   By: Monte Fantasia M.D.   On: 01/23/2018 09:39   Dg Abd 2 Views  Result Date: 01/23/2018 CLINICAL DATA:  Pain in abdomen. EXAM: ABDOMEN - 2 VIEW COMPARISON:  None. FINDINGS: The bowel gas pattern is normal. There is no evidence of free air. No radio-opaque calculi or other significant radiographic abnormality is seen. Previous lumbar fusion. IMPRESSION: Unremarkable flat and erect abdomen radiographs. Electronically Signed   By: Staci Righter M.D.   On: 01/23/2018 14:56      Allergies as of 01/24/2018      Reactions   Gadolinium Derivatives Itching   Pt began itching across ribs and back and neck s/p Multihance injection      Medication List    STOP taking these medications   omega-3 acid ethyl esters 1 g capsule Commonly known as:  LOVAZA     TAKE these medications   ACCU-CHEK AVIVA PLUS w/Device Kit 1 kit by Does not apply route daily.   atorvastatin 20 MG tablet Commonly known as:  LIPITOR Take 1 tablet (20 mg total) by mouth daily at 6 PM. Take 1 tablet ( 20 mg total ) by mouth at bedtime.   empagliflozin 25 MG Tabs tablet Commonly known as:  JARDIANCE Take 25 mg by mouth daily.   FLUoxetine 20 MG capsule Commonly known as:  PROZAC Take 1 capsule (20 mg total) by mouth daily.   gabapentin 300 MG capsule Commonly known as:  NEURONTIN Take 1 capsule (300 mg total) by mouth at bedtime.   glimepiride 4 MG tablet Commonly known as:  AMARYL TAKE 1 TABLET BY MOUTH TWICE DAILY AFTER A MEAL   glucose blood test strip Commonly known as:  ACCU-CHEK AVIVA PLUS Use as instructed   losartan-hydrochlorothiazide 100-12.5 MG tablet Commonly known as:  HYZAAR Take 1 tablet by mouth daily.    metFORMIN 1000 MG tablet Commonly known as:  GLUCOPHAGE Take 1 tablet (1,000 mg total) by mouth 2 (two) times daily with a meal.   sitaGLIPtin 100 MG tablet Commonly known as:  JANUVIA Take 1 tablet (100 mg total) by mouth daily.   traZODone 50 MG tablet Commonly known as:  DESYREL Take 0.5-1 tablets (25-50 mg total) by mouth at bedtime as needed for sleep.          Management plans discussed with the patient and he is in agreement. Stable for discharge home  Patient should follow up with pcp  CODE STATUS:     Code Status Orders  (From admission, onward)        Start     Ordered   01/23/18 1551  Full code  Continuous     01/23/18 1550    Code Status History    This patient has a current code status  but no historical code status.      TOTAL TIME TAKING CARE OF THIS PATIENT: 38 minutes.    Note: This dictation was prepared with Dragon dictation along with smaller phrase technology. Any transcriptional errors that result from this process are unintentional.  Savon Cobbs M.D on 01/24/2018 at 11:11 AM  Between 7am to 6pm - Pager - [phone] After 6pm go to [email or web address] - password [email or web address] [facility]  Office  [phone]  CC: Primary care physician; Steele Sizer, MD

## 2018-01-24 NOTE — Progress Notes (Signed)
Dr. Benjie Karvonen ordered discharge home. Patient has tolerated a soft diet without complaints of abdominal pain, nausea and vomiting. A video interpreter was used to translate discharge instructions in Arabic. Questions were encouraged and verbalized understanding of all information provided.

## 2018-01-24 NOTE — Plan of Care (Signed)
Patient ambulated in hallway once and passed a lot of gas while ambulating to the bathroom.  Will continue to monitor.  [name]  01/24/2018  3:47 AM

## 2018-01-24 NOTE — Progress Notes (Signed)
Patient states is feeling better today he passed a lot of gas while walking according to the nursing notes.  He has not had no nausea or vomiting.  Vital signs reviewed and stable Him and much less distended much less tympanitic and nontender Small umbilical hernia  Nontender calves No icterus no jaundice  Patient doing quite well at this time he has passed a lot of gas and the etiology of his distention is not clear based on his CT findings etc.  Awaiting GI consult at this time.  Surgical plans.

## 2018-01-24 NOTE — Discharge Instructions (Signed)
Dr. Vicente Males recommends taking laxative/stool softener to help produce soft, regular bowel movements.

## 2018-01-26 ENCOUNTER — Telehealth: Payer: Self-pay

## 2018-01-26 NOTE — Telephone Encounter (Signed)
Copied from [place] [id number]. Topic: General - Other >> Jan 26, 2018  8:40 AM Yvette Rack wrote: Reason for CRM: Almyra Free from [facility]  [phone] calling stating that pt has an appt tomorrow at 9:30 she has question about [facility] order please call her at [phone] today  Consulted with Dr. Ancil Boozer, patient had this imaging done on 01/23/18. Order will be cancelled.   Appointment will be cancelled via interpreter.

## 2018-01-26 NOTE — Telephone Encounter (Signed)
TOC #1. Called pt to f/u after d/c from [facility] on 01/24/18. At the time of this entry, pt has not scheduled a hosp f/u appt w/ Dr. Ancil Boozer. Discharge planning includes the following:  - Continue outpatient medications - Resume ADA diet - Stop Lovaza - f/u Dr. Ancil Boozer in 1 week - f/u Dr. Vicente Males in 1 week LVM requesting returned call.

## 2018-01-27 ENCOUNTER — Ambulatory Visit: Admission: RE | Admit: 2018-01-27 | Payer: Medicaid Other | Source: Ambulatory Visit

## 2018-01-27 ENCOUNTER — Telehealth: Payer: Self-pay

## 2018-01-27 NOTE — Telephone Encounter (Signed)
TOC #2. Called pt to f/u after d/c from [facility] on 01/24/18. At the time of this entry, pt still has not scheduled a hosp f/u appt w/ Dr. Ancil Boozer. Discharge planning includes the following:  - Continue outpatient medications - Resume ADA diet - Stop Lovaza - f/u Dr. Ancil Boozer in 1 week - f/u Dr. Vicente Males in 1 week LVM requesting returned call.

## 2018-01-30 ENCOUNTER — Encounter: Payer: Self-pay | Admitting: Family Medicine

## 2018-02-10 ENCOUNTER — Encounter: Payer: Self-pay | Admitting: Family Medicine

## 2018-02-10 ENCOUNTER — Ambulatory Visit: Payer: Medicaid Other | Admitting: Family Medicine

## 2018-02-10 VITALS — BP 142/68 | HR 88 | Temp 98.1°F | Resp 16 | Ht 68.0 in | Wt 157.7 lb

## 2018-02-10 DIAGNOSIS — I10 Essential (primary) hypertension: Secondary | ICD-10-CM

## 2018-02-10 DIAGNOSIS — E785 Hyperlipidemia, unspecified: Secondary | ICD-10-CM

## 2018-02-10 DIAGNOSIS — Z23 Encounter for immunization: Secondary | ICD-10-CM

## 2018-02-10 DIAGNOSIS — E1165 Type 2 diabetes mellitus with hyperglycemia: Secondary | ICD-10-CM

## 2018-02-10 DIAGNOSIS — F331 Major depressive disorder, recurrent, moderate: Secondary | ICD-10-CM

## 2018-02-10 DIAGNOSIS — R140 Abdominal distension (gaseous): Secondary | ICD-10-CM

## 2018-02-10 MED ORDER — GABAPENTIN 300 MG PO CAPS: 300.0000 mg | ORAL_CAPSULE | Freq: Every day | ORAL | 1 refills | Status: DC

## 2018-02-10 MED ORDER — OMEGA-3-ACID ETHYL ESTERS 1 G PO CAPS: 2.0000 g | ORAL_CAPSULE | Freq: Two times a day (BID) | ORAL | 1 refills | Status: DC

## 2018-02-10 NOTE — Progress Notes (Signed)
Name: Daniel Powell   MRN: [id number]    DOB: Jun 03, 1957   Date:02/10/2018       Progress Note  Subjective  Chief Complaint  Chief Complaint  Patient presents with  . Follow-up    1 month f/u. patient has not seen the psychiatrist  . Medication Management    came in to change the cholesterol medication due to dizziness.  . Insomnia    sleeps about 3-4 hours per night.  . Diabetes    checks BS daily. ranges from 170, 140-150 and 120  . Constipation    flatuence, moves his bowels maybe 1/day but it's not much    HPI  Using translator through the phone system   DMII: he has been taking medication as prescribed. He denies side effects. He states glucose at home has been as low as 120, high of 170 and average 140-150. He denies polyphagia, polydipsia, but he has polyuria.   Insomnia: he has not seen psychiatrist yet, taking Trazodone but only sleeping 3-4 hours per day. We will make sure that we contact Dr. Crista Luria office today  Abdominal pain: resolved, he went to [facility] back in 08/02 and lipase was slightly elevated, but Dr. Vicente Males felt symptoms were related to fatty liver and constipation. He was advised to take Colace and PPI and monitor. He states pain has resolved. He has not been taking Colace and has noticed bowel movement is small but seems to be [employer] of 4.  Advised him to take Colace as prescribed  Depression: currently living by himself. His brother is back in [place] and his son lives 2 hours away. He does not speak English and seems isolated.  He needs to see Dr. Shea Evans. He is taking medication as prescribed.   Dyslipidemia: He stopped taking Lovaza by the [facility], advised to place in refrigerator and take it daily as recommended  Dizziness: he said dizziness got worse when he started to take medication. He states lasts less than one minute. He states it is not a problem  HTN: bp is still slightly elevated today, he is not sure of what he is taking, advised to bring all  medications next visit. He denies chest pain or palpitation  Patient Active Problem List   Diagnosis Date Noted  . Cholelithiasis without cholecystitis 01/24/2018  . Hepatic steatosis 01/24/2018  . Right inguinal hernia 01/24/2018  . Abdominal pain 01/23/2018  . Abdominal distention   . Acute pancreatitis   . Major depression, recurrent ([place]) 01/20/2018  . HTN (hypertension), benign 04/03/2017  . Hypertriglyceridemia 07/27/2015  . Diabetes mellitus type 2, uncontrolled ([place]) 03/01/2015  . Dyslipidemia 03/01/2015  . Back pain, thoracic 03/01/2015  . Persistent headaches 03/01/2015  . Folliculitis 74/94/4967  . Fracture of lumbar spine ([place]) 11/05/2013    Past Surgical History:  Procedure Laterality Date  . BACK SURGERY  2014  . HERNIA REPAIR      Family History  Problem Relation Age of Onset  . Heart attack Mother   . Diabetes Mother   . Hypertension Mother   . Heart disease Father   . Diabetes Father   . Hypertension Father   . Diabetes Sister   . Hypertension Sister     Social History   Socioeconomic History  . Marital status: Legally Separated    Spouse name: Not on file  . Number of children: 5  . Years of education: Not on file  . Highest education level: Not on file  Occupational History  .  Not on file  Social Needs  . Financial resource strain: Not on file  . Food insecurity:    Worry: Not on file    Inability: Not on file  . Transportation needs:    Medical: Not on file    Non-medical: Not on file  Tobacco Use  . Smoking status: Never Smoker  . Smokeless tobacco: Never Used  Substance and Sexual Activity  . Alcohol use: No    Alcohol/week: 0.0 standard drinks  . Drug use: No  . Sexual activity: Not Currently    Partners: Female  Lifestyle  . Physical activity:    Days per week: 0 days    Minutes per session: 0 min  . Stress: Not at all  Relationships  . Social connections:    Talks on phone: Twice a week    Gets together: Twice a week     Attends religious service: Never    Active member of club or organization: No    Attends meetings of clubs or organizations: Never    Relationship status: Separated  . Intimate partner violence:    Fear of current or ex partner: No    Emotionally abused: No    Physically abused: No    Forced sexual activity: No  Other Topics Concern  . Not on file  Social History Narrative   Lives at home with son.  Does not work.  Has significant other.        Has 5 adult sons      Brother has recently travelled to [place]      Son lives about 2hrs away.     Current Outpatient Medications:  .  atorvastatin (LIPITOR) 20 MG tablet, Take 1 tablet (20 mg total) by mouth daily at 6 PM. Take 1 tablet ( 20 mg total ) by mouth at bedtime. (Patient not taking: Reported on 02/10/2018), Disp: 90 tablet, Rfl: 1 .  Blood Glucose Monitoring Suppl (ACCU-CHEK AVIVA PLUS) w/Device KIT, 1 kit by Does not apply route daily., Disp: 1 kit, Rfl: 0 .  empagliflozin (JARDIANCE) 25 MG TABS tablet, Take 25 mg by mouth daily., Disp: 30 tablet, Rfl: 2 .  FLUoxetine (PROZAC) 20 MG capsule, Take 1 capsule (20 mg total) by mouth daily., Disp: 30 capsule, Rfl: 1 .  gabapentin (NEURONTIN) 300 MG capsule, Take 1 capsule (300 mg total) by mouth at bedtime., Disp: 90 capsule, Rfl: 1 .  glimepiride (AMARYL) 4 MG tablet, TAKE 1 TABLET BY MOUTH TWICE DAILY AFTER A MEAL, Disp: 180 tablet, Rfl: 1 .  glucose blood (ACCU-CHEK AVIVA PLUS) test strip, Use as instructed, Disp: 100 each, Rfl: 12 .  losartan-hydrochlorothiazide (HYZAAR) 100-12.5 MG tablet, Take 1 tablet by mouth daily., Disp: , Rfl: 1 .  metFORMIN (GLUCOPHAGE) 1000 MG tablet, Take 1 tablet (1,000 mg total) by mouth 2 (two) times daily with a meal., Disp: 180 tablet, Rfl: 1 .  omega-3 acid ethyl esters (LOVAZA) 1 g capsule, Take 2 capsules (2 g total) by mouth 2 (two) times daily., Disp: 360 capsule, Rfl: 1 .  sitaGLIPtin (JANUVIA) 100 MG tablet, Take 1 tablet (100 mg total) by  mouth daily., Disp: 90 tablet, Rfl: 0 .  traZODone (DESYREL) 50 MG tablet, Take 0.5-1 tablets (25-50 mg total) by mouth at bedtime as needed for sleep., Disp: 30 tablet, Rfl: 1  Allergies  Allergen Reactions  . Gadolinium Derivatives Itching    Pt began itching across ribs and back and neck s/p Multihance injection  ROS  Ten systems reviewed and is negative except as mentioned in HPI   Objective  Vitals:   02/10/18 1002  BP: (!) 142/68  Pulse: 88  Resp: 16  Temp: 98.1 F (36.7 C)  TempSrc: Oral  SpO2: 97%  Weight: 157 lb 11.2 oz (71.5 kg)  Height: 5' 8"  (1.727 m)    Body mass index is 23.98 kg/m.  Physical Exam  Constitutional: Patient appears well-developed and well-nourished.  No distress.  HEENT: head atraumatic, normocephalic, pupils equal and reactive to light,  neck supple, throat within normal limits Cardiovascular: Normal rate, regular rhythm and normal heart sounds.  No murmur heard. No BLE edema. Pulmonary/Chest: Effort normal and breath sounds normal. No respiratory distress. Abdominal: Soft.  There is no tenderness. Psychiatric: Patient has a normal mood and affect. behavior is normal. Judgment and thought content normal. Difficulty to communicate because of language barrier   Recent Results (from the past 2160 hour(s))  POCT HgB A1C     Status: Abnormal   Collection Time: 01/20/18  3:50 PM  Result Value Ref Range   Hemoglobin A1C  4.0 - 5.6 %   HbA1c POC (<> result, manual entry)  4.0 - 5.6 %   HbA1c, POC (prediabetic range)  5.7 - 6.4 %   HbA1c, POC (controlled diabetic range) 9.6 (A) 0.0 - 7.0 %  CBC with Differential/Platelet     Status: None   Collection Time: 01/20/18  4:32 PM  Result Value Ref Range   WBC 5.2 3.8 - 10.8 Thousand/uL   RBC 5.25 4.20 - 5.80 Million/uL   Hemoglobin 14.8 13.2 - 17.1 g/dL   HCT 43.5 38.5 - 50.0 %   MCV 82.9 80.0 - 100.0 fL   MCH 28.2 27.0 - 33.0 pg   MCHC 34.0 32.0 - 36.0 g/dL   RDW 13.3 11.0 - 15.0 %    Platelets 205 140 - 400 Thousand/uL   MPV 9.2 7.5 - 12.5 fL   Neutro Abs 3,115 1,500 - 7,800 cells/uL   Lymphs Abs 1,420 850 - 3,900 cells/uL   WBC mixed population 432 200 - 950 cells/uL   Eosinophils Absolute 172 15 - 500 cells/uL   Basophils Absolute 62 0 - 200 cells/uL   Neutrophils Relative % 59.9 %   Total Lymphocyte 27.3 %   Monocytes Relative 8.3 %   Eosinophils Relative 3.3 %   Basophils Relative 1.2 %  Lipid panel     Status: Abnormal   Collection Time: 01/20/18  4:32 PM  Result Value Ref Range   Cholesterol 161 <200 mg/dL   HDL 35 (L) >40 mg/dL   Triglycerides 371 (H) <150 mg/dL    Comment: . If a non-fasting specimen was collected, consider repeat triglyceride testing on a fasting specimen if clinically indicated.  Yates Decamp et al. J. of Clin. Lipidol. 0258;5:277-824. Marland Kitchen    LDL Cholesterol (Calc) 81 mg/dL (calc)    Comment: Reference range: <100 . Desirable range <100 mg/dL for primary prevention;   <70 mg/dL for patients with CHD or diabetic patients  with > or = 2 CHD risk factors. Marland Kitchen LDL-C is now calculated using the Martin-Hopkins  calculation, which is a validated novel method providing  better accuracy than the Friedewald equation in the  estimation of LDL-C.  Cresenciano Genre et al. Annamaria Helling. 2353;614(43): 2061-2068  (http://education.[facility].com/faq/FAQ164)    Total CHOL/HDL Ratio 4.6 <5.0 (calc)   Non-HDL Cholesterol (Calc) 126 <130 mg/dL (calc)    Comment: For patients with diabetes plus 1 major  ASCVD risk  factor, treating to a non-HDL-C goal of <100 mg/dL  (LDL-C of <70 mg/dL) is considered a therapeutic  option.   HIV antibody     Status: None   Collection Time: 01/20/18  4:32 PM  Result Value Ref Range   HIV 1&2 Ab, 4th Generation NON-REACTIVE NON-REACTI    Comment: HIV-1 antigen and HIV-1/HIV-2 antibodies were not detected. There is no laboratory evidence of HIV infection. Marland Kitchen PLEASE NOTE: This information has been disclosed to you from  records whose confidentiality may be protected by state law.  If your state requires such protection, then the state law prohibits you from making any further disclosure of the information without the specific written consent of the person to whom it pertains, or as otherwise permitted by law. A general authorization for the release of medical or other information is NOT sufficient for this purpose. . For additional information please refer to http://education.[facility].com/faq/FAQ106 (This link is being provided for informational/ educational purposes only.) . Marland Kitchen The performance of this assay has not been clinically validated in patients less than 77 years old. .   Hepatitis C Antibody     Status: None   Collection Time: 01/20/18  4:32 PM  Result Value Ref Range   Hepatitis C Ab NON-REACTIVE NON-REACTI   SIGNAL TO CUT-OFF 0.02 <1.00    Comment: . HCV antibody was non-reactive. There is no laboratory  evidence of HCV infection. . In most cases, no further action is required. However, if recent HCV exposure is suspected, a test for HCV RNA (test code [phone]) is suggested. . For additional information please refer to http://education.[facility].com/faq/FAQ22v1 (This link is being provided for informational/ educational purposes only.) .   COMPLETE METABOLIC PANEL WITH GFR     Status: Abnormal   Collection Time: 01/20/18  4:32 PM  Result Value Ref Range   Glucose, Bld 237 (H) 65 - 139 mg/dL    Comment: .        Non-fasting reference interval .    BUN 23 7 - 25 mg/dL   Creat 1.12 0.70 - 1.25 mg/dL    Comment: For patients >36 years of age, the reference limit for Creatinine is approximately 13% higher for people identified as African-American. .    GFR, Est Non African American 71 > OR = 60 mL/min/1.70m   GFR, Est African American 82 > OR = 60 mL/min/1.735m  BUN/Creatinine Ratio NOT APPLICABLE 6 - 22 (calc)   Sodium 137 135 - 146 mmol/L   Potassium 4.2  3.5 - 5.3 mmol/L   Chloride 99 98 - 110 mmol/L   CO2 28 20 - 32 mmol/L   Calcium 10.3 8.6 - 10.3 mg/dL   Total Protein 7.6 6.1 - 8.1 g/dL   Albumin 4.9 3.6 - 5.1 g/dL   Globulin 2.7 1.9 - 3.7 g/dL (calc)   AG Ratio 1.8 1.0 - 2.5 (calc)   Total Bilirubin 1.1 0.2 - 1.2 mg/dL   Alkaline phosphatase (APISO) 64 40 - 115 U/L   AST 15 10 - 35 U/L   ALT 22 9 - 46 U/L  CBC     Status: Abnormal   Collection Time: 01/23/18  8:07 AM  Result Value Ref Range   WBC 7.1 3.8 - 10.6 K/uL   RBC 4.78 4.40 - 5.90 MIL/uL   Hemoglobin 14.0 13.0 - 18.0 g/dL   HCT 39.6 (L) 40.0 - 52.0 %   MCV 82.8 80.0 - 100.0 fL   MCH 29.3 26.0 -  34.0 pg   MCHC 35.4 32.0 - 36.0 g/dL   RDW 13.9 11.5 - 14.5 %   Platelets 183 150 - 440 K/uL    Comment: Performed at [facility], [address] [place], [place] [address]  Comprehensive metabolic panel     Status: Abnormal   Collection Time: 01/23/18  8:07 AM  Result Value Ref Range   Sodium 139 135 - 145 mmol/L   Potassium 4.0 3.5 - 5.1 mmol/L   Chloride 101 98 - 111 mmol/L   CO2 27 22 - 32 mmol/L   Glucose, Bld 333 (H) 70 - 99 mg/dL   BUN 27 (H) 6 - 20 mg/dL   Creatinine, Ser 1.77 (H) 0.61 - 1.24 mg/dL   Calcium 9.2 8.9 - 10.3 mg/dL   Total Protein 6.9 6.5 - 8.1 g/dL   Albumin 4.2 3.5 - 5.0 g/dL   AST 28 15 - 41 U/L   ALT 25 0 - 44 U/L   Alkaline Phosphatase 55 38 - 126 U/L   Total Bilirubin 1.2 0.3 - 1.2 mg/dL   GFR calc non Af Amer 40 (L) >60 mL/min   GFR calc Af Amer 46 (L) >60 mL/min    Comment: (NOTE) The eGFR has been calculated using the CKD EPI equation. This calculation has not been validated in all clinical situations. eGFR's persistently <60 mL/min signify possible Chronic Kidney Disease.    Anion gap 11 5 - 15    Comment: Performed at [facility], [address] [place], [place] [address]  Lipase, blood     Status: Abnormal   Collection Time: 01/23/18  8:07 AM  Result Value Ref Range   Lipase 271 (H) 11 - 51 U/L     Comment: Performed at [facility], [address] [place], [place] [address]  Urinalysis, Complete w Microscopic     Status: Abnormal   Collection Time: 01/23/18  8:50 AM  Result Value Ref Range   Color, Urine STRAW (A) YELLOW   APPearance CLEAR (A) CLEAR   Specific Gravity, Urine 1.006 1.005 - 1.030   pH 7.0 5.0 - 8.0   Glucose, UA >=500 (A) NEGATIVE mg/dL   Hgb urine dipstick NEGATIVE NEGATIVE   Bilirubin Urine NEGATIVE NEGATIVE   Ketones, ur NEGATIVE NEGATIVE mg/dL   Protein, ur NEGATIVE NEGATIVE mg/dL   Nitrite NEGATIVE NEGATIVE   Leukocytes, UA NEGATIVE NEGATIVE   RBC / HPF 0-5 0 - 5 RBC/hpf   WBC, UA 0-5 0 - 5 WBC/hpf   Bacteria, UA NONE SEEN NONE SEEN   Squamous Epithelial / LPF NONE SEEN 0 - 5    Comment: Performed at [facility], [address] [place], [place] [address]  Lactic acid, plasma     Status: None   Collection Time: 01/23/18  2:58 PM  Result Value Ref Range   Lactic Acid, Venous 1.5 0.5 - 1.9 mmol/L    Comment: Performed at [facility], [address] [place], [place] [address]  Sedimentation rate     Status: None   Collection Time: 01/23/18  2:58 PM  Result Value Ref Range   Sed Rate 13 0 - 20 mm/hr    Comment: Performed at [facility], [address] [place], [place] [address]  Glucose, capillary     Status: Abnormal   Collection Time: 01/23/18  5:24 PM  Result Value Ref Range   Glucose-Capillary 137 (H) 70 - 99 mg/dL   Comment 1 Notify RN  Lactic acid, plasma     Status: None   Collection Time: 01/23/18  5:39 PM  Result Value Ref Range   Lactic Acid, Venous 0.9 0.5 - 1.9 mmol/L    Comment: Performed at [facility], [address] [place], [place] [address]  Glucose, capillary     Status: None   Collection Time: 01/23/18  9:26 PM  Result Value Ref Range   Glucose-Capillary 84 70 - 99 mg/dL   Comment 1 Notify RN   Glucose, capillary     Status: None   Collection Time: 01/23/18 11:59 PM   Result Value Ref Range   Glucose-Capillary 93 70 - 99 mg/dL   Comment 1 Notify RN   Lipase, blood     Status: Abnormal   Collection Time: 01/24/18  5:25 AM  Result Value Ref Range   Lipase 83 (H) 11 - 51 U/L    Comment: Performed at [facility], [address] [place], [place] [address]  Comprehensive metabolic panel     Status: Abnormal   Collection Time: 01/24/18  5:26 AM  Result Value Ref Range   Sodium 139 135 - 145 mmol/L   Potassium 3.8 3.5 - 5.1 mmol/L   Chloride 103 98 - 111 mmol/L   CO2 29 22 - 32 mmol/L   Glucose, Bld 84 70 - 99 mg/dL   BUN 18 6 - 20 mg/dL   Creatinine, Ser 1.06 0.61 - 1.24 mg/dL   Calcium 8.4 (L) 8.9 - 10.3 mg/dL   Total Protein 6.2 (L) 6.5 - 8.1 g/dL   Albumin 3.6 3.5 - 5.0 g/dL   AST 20 15 - 41 U/L   ALT 20 0 - 44 U/L   Alkaline Phosphatase 38 38 - 126 U/L   Total Bilirubin 1.4 (H) 0.3 - 1.2 mg/dL   GFR calc non Af Amer >60 >60 mL/min   GFR calc Af Amer >60 >60 mL/min    Comment: (NOTE) The eGFR has been calculated using the CKD EPI equation. This calculation has not been validated in all clinical situations. eGFR's persistently <60 mL/min signify possible Chronic Kidney Disease.    Anion gap 7 5 - 15    Comment: Performed at [facility], [address] [place], [place] [address]  CBC     Status: Abnormal   Collection Time: 01/24/18  5:26 AM  Result Value Ref Range   WBC 5.6 3.8 - 10.6 K/uL   RBC 4.39 (L) 4.40 - 5.90 MIL/uL   Hemoglobin 13.0 13.0 - 18.0 g/dL   HCT 36.5 (L) 40.0 - 52.0 %   MCV 83.0 80.0 - 100.0 fL   MCH 29.5 26.0 - 34.0 pg   MCHC 35.6 32.0 - 36.0 g/dL   RDW 13.8 11.5 - 14.5 %   Platelets 174 150 - 440 K/uL    Comment: Performed at [facility], [address] [place], [place] [address]  Glucose, capillary     Status: None   Collection Time: 01/24/18  7:54 AM  Result Value Ref Range   Glucose-Capillary 83 70 - 99 mg/dL   Comment 1 Notify RN   Glucose, capillary     Status: None    Collection Time: 01/24/18 11:40 AM  Result Value Ref Range   Glucose-Capillary 79 70 - 99 mg/dL   Comment 1 Notify RN     PHQ2/9: Depression screen [facility] 2/9 01/20/2018 07/09/2017 07/02/2017 06/25/2017 04/10/2017  Decreased Interest 0 0 0 0 0  Down, Depressed, Hopeless  2 0 0 0 0  PHQ - 2 Score 2 0 0 0 0  Altered sleeping 3 - - - -  Tired, decreased energy 3 - - - -  Change in appetite 0 - - - -  Feeling bad or failure about yourself  0 - - - -  Trouble concentrating 1 - - - -  Moving slowly or fidgety/restless 1 - - - -  Suicidal thoughts 0 - - - -  PHQ-9 Score 10 - - - -  Difficult doing work/chores Not difficult at all - - - -     Fall Risk: Fall Risk  02/10/2018 01/20/2018 07/09/2017 07/02/2017 06/25/2017  Falls in the past year? No No No No No     Functional Status Survey: Is the patient deaf or have difficulty hearing?: No Does the patient have difficulty seeing, even when wearing glasses/contacts?: Yes Does the patient have difficulty concentrating, remembering, or making decisions?: No Does the patient have difficulty walking or climbing stairs?: No Does the patient have difficulty dressing or bathing?: No Does the patient have difficulty doing errands alone such as visiting a doctor's office or shopping?: No    Assessment & Plan  1. Uncontrolled type 2 diabetes mellitus with hyperglycemia (HCC)  Improving, continue medication   2. Need for vaccination for pneumococcus  He had agreed on getting immunization but change his mind and states he got at a different facility   3. HTN (hypertension), benign  bp is elevated today, needs to return with all medications , continue current dose for now  4. Dyslipidemia  Resume Lovaza   5. Bloating  Take Colace and see Dr. Vicente Males   6. MDD (major depressive disorder), recurrent episode, moderate ([place])  Needs to see psychiatrist

## 2018-02-26 ENCOUNTER — Other Ambulatory Visit: Payer: Self-pay

## 2018-02-26 ENCOUNTER — Encounter: Payer: Self-pay | Admitting: Gastroenterology

## 2018-02-26 ENCOUNTER — Ambulatory Visit: Payer: Medicaid Other | Admitting: Gastroenterology

## 2018-02-26 VITALS — BP 127/69 | HR 85 | Ht 68.0 in | Wt 158.4 lb

## 2018-02-26 DIAGNOSIS — K581 Irritable bowel syndrome with constipation: Secondary | ICD-10-CM

## 2018-02-26 NOTE — Progress Notes (Signed)
Jonathon Bellows MD, MRCP(U.K) [address]  [place]  [address] [place], [place] [address]  Main: [phone]  Fax: [phone]   Primary Care Physician: Steele Sizer, MD  Primary Gastroenterologist:  Dr. Jonathon Bellows   No chief complaint on file.   HPI: Daniel Powell is a 62 y.o. male   Summary of history :  Here today as a hospital follow-up.  I was consulted to see him on 01/24/2018 when he was admitted with abdominal pain and distention.  He is a CT scan of the abdomen that demonstrated no acute findings but showed hepatic steatosis and cholelithiasis and a right inguinal hernia.  He also had an x-ray of his abdomen which showed no acute abnormalities.  Ultrasound of the abdomen showed multiple gallstones without evident evidence of acute cholecystitis or biliary dilation.  Lipase was elevated to 71, glucose is greater than 500 in the urine, serum glucose was 333 and creatinine of 177.  Transaminases was normal.  I had been consulted to also comment on hepatic steatosis noted on the CT scan.  He was seen by Dr. Burt Knack by surgery.  The abdominal distention had developed over the day with some pain with no nausea vomiting.  He denied any regular NSAID use.  He was not passing much gas.  He has not had a bowel movement.  On the day I examined him he said that the abdominal distention had improved and he had no pain.  My impression for his abdominal pain and distention was that he probably had gaseous distention from consumption of artificial sugars versus carbohydrate malabsorption versus gastroparesis from poorly controlled diabetes.  Interval history   01/24/2018-9 /10/2017  She was seen on 02/10/2018 by Dr. Ancil Boozer.  He has been referred to see me back for bloating.He is here with a hospital interpretor.   Today he says that he is doing well. Denies any pain He says that he has one bowel movement daily and very hard. . Consistency of rocks. Feels good when he has a good bowel movement .   Never  had a colonoscopy . No family history of colon cancer or polyps   Current Outpatient Medications  Medication Sig Dispense Refill  . atorvastatin (LIPITOR) 20 MG tablet Take 1 tablet (20 mg total) by mouth daily at 6 PM. Take 1 tablet ( 20 mg total ) by mouth at bedtime. (Patient not taking: Reported on 02/10/2018) 90 tablet 1  . Blood Glucose Monitoring Suppl (ACCU-CHEK AVIVA PLUS) w/Device KIT 1 kit by Does not apply route daily. 1 kit 0  . empagliflozin (JARDIANCE) 25 MG TABS tablet Take 25 mg by mouth daily. 30 tablet 2  . FLUoxetine (PROZAC) 20 MG capsule Take 1 capsule (20 mg total) by mouth daily. 30 capsule 1  . gabapentin (NEURONTIN) 300 MG capsule Take 1 capsule (300 mg total) by mouth at bedtime. 90 capsule 1  . glimepiride (AMARYL) 4 MG tablet TAKE 1 TABLET BY MOUTH TWICE DAILY AFTER A MEAL 180 tablet 1  . glucose blood (ACCU-CHEK AVIVA PLUS) test strip Use as instructed 100 each 12  . losartan-hydrochlorothiazide (HYZAAR) 100-12.5 MG tablet Take 1 tablet by mouth daily.  1  . metFORMIN (GLUCOPHAGE) 1000 MG tablet Take 1 tablet (1,000 mg total) by mouth 2 (two) times daily with a meal. 180 tablet 1  . omega-3 acid ethyl esters (LOVAZA) 1 g capsule Take 2 capsules (2 g total) by mouth 2 (two) times daily. 360 capsule 1  . sitaGLIPtin (  JANUVIA) 100 MG tablet Take 1 tablet (100 mg total) by mouth daily. 90 tablet 0  . traZODone (DESYREL) 50 MG tablet Take 0.5-1 tablets (25-50 mg total) by mouth at bedtime as needed for sleep. 30 tablet 1   No current facility-administered medications for this visit.     Allergies as of 02/26/2018 - Review Complete 02/10/2018  Allergen Reaction Noted  . Gadolinium derivatives Itching 10/28/2014    ROS:  General: Negative for anorexia, weight loss, fever, chills, fatigue, weakness. ENT: Negative for hoarseness, difficulty swallowing , nasal congestion. CV: Negative for chest pain, angina, palpitations, dyspnea on exertion, peripheral edema.    Respiratory: Negative for dyspnea at rest, dyspnea on exertion, cough, sputum, wheezing.  GI: See history of present illness. GU:  Negative for dysuria, hematuria, urinary incontinence, urinary frequency, nocturnal urination.  Endo: Negative for unusual weight change.    Physical Examination:   There were no vitals taken for this visit.  General: Well-nourished, well-developed in no acute distress.  Eyes: No icterus. Conjunctivae pink. Mouth: Oropharyngeal mucosa moist and pink , no lesions erythema or exudate. Lungs: Clear to auscultation bilaterally. Non-labored. Heart: Regular rate and rhythm, no murmurs rubs or gallops.  Abdomen: Bowel sounds are normal, nontender, nondistended, no hepatosplenomegaly or masses, no abdominal bruits or hernia , no rebound or guarding.   Extremities: No lower extremity edema. No clubbing or deformities. Neuro: Alert and oriented x 3.  Grossly intact. Skin: Warm and dry, no jaundice.   Psych: Alert and cooperative, normal mood and affect.   Imaging Studies: No results found.  Assessment and Plan:   Daniel Powell is a 62 y.o. y/o male here to see me for a hospital follow up . His history of abdominal distension and gas relieved after a good movement suggest IBS-C. Likely poorly controlled diabetes also playing a role.   1. Tight glycemic control  2. Daily linzess 145 mcg - 2 week samples provided- if it works to call my office to send a script, if it doesn't help produce softer stools then will increase dose with more samples. 3. Colonoscopy for colon cancer screening as he says he has never had one.   I have discussed alternative options, risks & benefits,  which include, but are not limited to, bleeding, infection, perforation,respiratory complication & drug reaction.  The patient agrees with this plan & written consent will be obtained.     Dr Jonathon Bellows  MD,MRCP [facility]) Follow up 3 months

## 2018-03-03 ENCOUNTER — Telehealth: Payer: Self-pay

## 2018-03-03 NOTE — Telephone Encounter (Signed)
Patient was scheduled during his office visit with [place] for 03/06/18 for his colonoscopy.  Due to anesthesia staffing at the hospital we need to reschedule.  LVM for pt to call back to reschedule for another date with Dr. Vicente Males.  Thanks [employer]

## 2018-03-04 ENCOUNTER — Other Ambulatory Visit: Payer: Self-pay

## 2018-03-04 DIAGNOSIS — K581 Irritable bowel syndrome with constipation: Secondary | ICD-10-CM

## 2018-03-04 DIAGNOSIS — Z1211 Encounter for screening for malignant neoplasm of colon: Secondary | ICD-10-CM

## 2018-03-04 NOTE — Telephone Encounter (Signed)
Pt has been informed of the need to reschedule colonoscopy. Pt has agreed to reschedule date to 03-11-18.

## 2018-03-04 NOTE — Telephone Encounter (Signed)
Was this the pt that was going to [facility] on Friday with [facility]? Did he decide to go there?

## 2018-03-05 ENCOUNTER — Ambulatory Visit: Payer: Self-pay | Admitting: Family Medicine

## 2018-03-11 ENCOUNTER — Ambulatory Visit: Payer: Medicaid Other | Admitting: Anesthesiology

## 2018-03-11 ENCOUNTER — Encounter
Admission: RE | Disposition: A | Discharge status: Home / Self Care | Payer: Self-pay | Source: Ambulatory Visit | Attending: Gastroenterology

## 2018-03-11 ENCOUNTER — Other Ambulatory Visit: Payer: Self-pay

## 2018-03-11 ENCOUNTER — Ambulatory Visit
Admission: RE | Admit: 2018-03-11 | Discharge: 2018-03-11 | Disposition: A | Discharge status: Home / Self Care | Payer: Medicaid Other | Source: Ambulatory Visit | Attending: Gastroenterology | Admitting: Gastroenterology

## 2018-03-11 ENCOUNTER — Encounter: Payer: Self-pay | Admitting: Anesthesiology

## 2018-03-11 DIAGNOSIS — Z79899 Other long term (current) drug therapy: Secondary | ICD-10-CM | POA: Insufficient documentation

## 2018-03-11 DIAGNOSIS — D124 Benign neoplasm of descending colon: Secondary | ICD-10-CM | POA: Diagnosis not present

## 2018-03-11 DIAGNOSIS — Z1211 Encounter for screening for malignant neoplasm of colon: Secondary | ICD-10-CM | POA: Diagnosis present

## 2018-03-11 DIAGNOSIS — Z7984 Long term (current) use of oral hypoglycemic drugs: Secondary | ICD-10-CM | POA: Diagnosis not present

## 2018-03-11 DIAGNOSIS — I10 Essential (primary) hypertension: Secondary | ICD-10-CM | POA: Diagnosis not present

## 2018-03-11 DIAGNOSIS — E119 Type 2 diabetes mellitus without complications: Secondary | ICD-10-CM | POA: Diagnosis not present

## 2018-03-11 DIAGNOSIS — K581 Irritable bowel syndrome with constipation: Secondary | ICD-10-CM

## 2018-03-11 DIAGNOSIS — K5900 Constipation, unspecified: Secondary | ICD-10-CM | POA: Diagnosis not present

## 2018-03-11 DIAGNOSIS — K635 Polyp of colon: Secondary | ICD-10-CM | POA: Diagnosis not present

## 2018-03-11 HISTORY — PX: COLONOSCOPY WITH PROPOFOL: SHX5780

## 2018-03-11 LAB — GLUCOSE, CAPILLARY: Glucose-Capillary: 141 mg/dL — ABNORMAL HIGH (ref 70–99)

## 2018-03-11 SURGERY — COLONOSCOPY WITH PROPOFOL
Anesthesia: General

## 2018-03-11 MED ORDER — LIDOCAINE HCL (CARDIAC) PF 100 MG/5ML IV SOSY
PREFILLED_SYRINGE | INTRAVENOUS | Status: DC | PRN
  Administered 2018-03-11: 100 mg via INTRAVENOUS

## 2018-03-11 MED ORDER — PROPOFOL 500 MG/50ML IV EMUL
INTRAVENOUS | Status: DC | PRN
  Administered 2018-03-11: 100 ug/kg/min via INTRAVENOUS

## 2018-03-11 MED ORDER — PROPOFOL 10 MG/ML IV BOLUS
INTRAVENOUS | Status: DC | PRN
  Administered 2018-03-11: 80 mg via INTRAVENOUS

## 2018-03-11 MED ORDER — PHENYLEPHRINE HCL 10 MG/ML IJ SOLN
INTRAMUSCULAR | Status: DC | PRN
  Administered 2018-03-11 (×2): 50 ug via INTRAVENOUS

## 2018-03-11 MED ORDER — SODIUM CHLORIDE 0.9 % IV SOLN
INTRAVENOUS | Status: DC
  Administered 2018-03-11: 14:00:00 via INTRAVENOUS

## 2018-03-11 MED ORDER — MIDAZOLAM HCL 5 MG/5ML IJ SOLN
INTRAMUSCULAR | Status: DC | PRN
  Administered 2018-03-11: 1 mg via INTRAVENOUS

## 2018-03-11 NOTE — Transfer of Care (Signed)
Immediate Anesthesia Transfer of Care Note  Patient: Daniel Powell  Procedure(s) Performed: COLONOSCOPY WITH PROPOFOL (N/A )  Patient Location: PACU  Anesthesia Type:General  Level of Consciousness: awake and patient cooperative  Airway & Oxygen Therapy: Patient Spontanous Breathing and Patient connected to nasal cannula oxygen  Post-op Assessment: Report given to RN and Post -op Vital signs reviewed and stable  Post vital signs: Reviewed and stable  Last Vitals:  Vitals Value Taken Time  BP 97/39 03/11/2018  2:48 PM  Temp 36.1 C 03/11/2018  2:45 PM  Pulse 62 03/11/2018  2:49 PM  Resp 20 03/11/2018  2:49 PM  SpO2 100 % 03/11/2018  2:49 PM  Vitals shown include unvalidated device data.  Last Pain:  Vitals:   03/11/18 1445  TempSrc: Tympanic  PainSc: Asleep         Complications: No apparent anesthesia complications

## 2018-03-11 NOTE — Anesthesia Post-op Follow-up Note (Signed)
Anesthesia QCDR form completed.        

## 2018-03-11 NOTE — Anesthesia Preprocedure Evaluation (Signed)
Anesthesia Evaluation  Patient identified by MRN, date of birth, ID band Patient awake    Reviewed: Allergy & Precautions, NPO status , Patient's Chart, lab work & pertinent test results, reviewed documented beta blocker date and time   Airway Mallampati: II  TM Distance: >3 FB     Dental  (+) Chipped   Pulmonary           Cardiovascular hypertension, Pt. on medications      Neuro/Psych  Headaches, PSYCHIATRIC DISORDERS Depression    GI/Hepatic   Endo/Other  diabetes, Type 2  Renal/GU      Musculoskeletal   Abdominal   Peds  Hematology   Anesthesia Other Findings   Reproductive/Obstetrics                             Anesthesia Physical Anesthesia Plan  ASA: III  Anesthesia Plan: General   Post-op Pain Management:    Induction: Intravenous  PONV Risk Score and Plan:   Airway Management Planned:   Additional Equipment:   Intra-op Plan:   Post-operative Plan:   Informed Consent: I have reviewed the patients History and Physical, chart, labs and discussed the procedure including the risks, benefits and alternatives for the proposed anesthesia with the patient or authorized representative who has indicated his/her understanding and acceptance.     Plan Discussed with: CRNA  Anesthesia Plan Comments:         Anesthesia Quick Evaluation

## 2018-03-11 NOTE — OR Nursing (Signed)
Cousin Will speaks english and will interpret for patient.  Questions answered appropriately.

## 2018-03-11 NOTE — Op Note (Signed)
[facility] Patient Name: Daniel Powell Procedure Date: 03/11/2018 2:26 PM MRN: [id number] Account #: [id number] Date of Birth: 1956/06/23 Admit Type: Outpatient Age: 62 Room: [facility] ENDO ROOM 1 Gender: Male Note Status: Finalized Procedure:            Colonoscopy Indications:          Constipation Providers:            Jonathon Bellows MD, MD Medicines:            Monitored Anesthesia Care Complications:        No immediate complications. Procedure:            Pre-Anesthesia Assessment:                       - Prior to the procedure, a History and Physical was                        performed, and patient medications, allergies and                        sensitivities were reviewed. The patient's tolerance of                        previous anesthesia was reviewed.                       - The risks and benefits of the procedure and the                        sedation options and risks were discussed with the                        patient. All questions were answered and informed                        consent was obtained.                       - ASA Grade Assessment: II - A patient with mild                        systemic disease.                       After obtaining informed consent, the colonoscope was                        passed under direct vision. Throughout the procedure,                        the patient's blood pressure, pulse, and oxygen                        saturations were monitored continuously. The                        Colonoscope was introduced through the anus and                        advanced to the the cecum, identified by the  appendiceal orifice, IC valve and transillumination.                        The colonoscopy was performed with ease. The patient                        tolerated the procedure well. The quality of the bowel                        preparation was poor. Findings:      The perianal  and digital rectal examinations were normal.      A 3 mm polyp was found in the descending colon. The polyp was sessile.       The polyp was removed with a cold biopsy forceps. Resection and       retrieval were complete.      The exam was otherwise without abnormality. Impression:           - Preparation of the colon was poor.                       - One 3 mm polyp in the descending colon, removed with                        a cold biopsy forceps. Resected and retrieved.                       - The examination was otherwise normal. Recommendation:       - Discharge patient to home (with escort).                       - Resume previous diet.                       - Continue present medications.                       - Await pathology results.                       - Return to my office as previously scheduled.                       - Prep inadequate to detect polyps., no large lesions                        that would cause constipation seen Procedure Code(s):    --- Professional ---                       [phone], Colonoscopy, flexible; with biopsy, single or                        multiple Diagnosis Code(s):    --- Professional ---                       D12.4, Benign neoplasm of descending colon                       K59.00, Constipation, unspecified CPT copyright 2017 American Medical Association. All rights reserved. The codes documented in this report are preliminary and upon coder review may  be revised to meet current compliance requirements. Jonathon Bellows, MD Jonathon Bellows MD, MD 03/11/2018 2:42:44 PM This report has been signed electronically. Number of Addenda: 0 Note Initiated On: 03/11/2018 2:26 PM Scope Withdrawal Time: 0 hours 7 minutes 18 seconds  Total Procedure Duration: 0 hours 8 minutes 55 seconds       [facility]

## 2018-03-11 NOTE — Anesthesia Postprocedure Evaluation (Signed)
Anesthesia Post Note  Patient: Daniel Powell  Procedure(s) Performed: COLONOSCOPY WITH PROPOFOL (N/A )  Patient location during evaluation: Endoscopy Anesthesia Type: General Level of consciousness: awake and alert Pain management: pain level controlled Vital Signs Assessment: post-procedure vital signs reviewed and stable Respiratory status: spontaneous breathing, nonlabored ventilation, respiratory function stable and patient connected to nasal cannula oxygen Cardiovascular status: blood pressure returned to baseline and stable Postop Assessment: no apparent nausea or vomiting Anesthetic complications: no     Last Vitals:  Vitals:   03/11/18 1455 03/11/18 1505  BP: (!) 115/59 122/62  Pulse: 68 68  Resp: 20 10  Temp:    SpO2: 98% 98%    Last Pain:  Vitals:   03/11/18 1505  TempSrc:   PainSc: 0-No pain                 Vitaliy Eisenhour S

## 2018-03-11 NOTE — H&P (Signed)
Jonathon Bellows, MD [address], [place], [place], [place], [address] [address], [place], [place], [place], [address] Phone: [phone]  Fax: [phone]  Primary Care Physician:  Steele Sizer, MD   Pre-Procedure History & Physical: HPI:  Daniel Powell is a 62 y.o. male is here for an colonoscopy.   Past Medical History:  Diagnosis Date  . Diabetes mellitus without complication ([place])   . Hypertension     Past Surgical History:  Procedure Laterality Date  . BACK SURGERY  2014  . HERNIA REPAIR      Prior to Admission medications   Medication Sig Start Date End Date Taking? Authorizing Provider  atorvastatin (LIPITOR) 20 MG tablet Take 1 tablet (20 mg total) by mouth daily at 6 PM. Take 1 tablet ( 20 mg total ) by mouth at bedtime. 07/09/17  Yes Roselee Nova, MD  empagliflozin (JARDIANCE) 25 MG TABS tablet Take 25 mg by mouth daily. 01/20/18  Yes Sowles, Drue Stager, MD  FLUoxetine (PROZAC) 20 MG capsule Take 1 capsule (20 mg total) by mouth daily. 01/20/18  Yes Sowles, Drue Stager, MD  glimepiride (AMARYL) 4 MG tablet TAKE 1 TABLET BY MOUTH TWICE DAILY AFTER A MEAL 07/09/17  Yes Roselee Nova, MD  losartan-hydrochlorothiazide (HYZAAR) 100-12.5 MG tablet Take 1 tablet by mouth daily. 01/02/18  Yes [provider]  metFORMIN (GLUCOPHAGE) 1000 MG tablet Take 1 tablet (1,000 mg total) by mouth 2 (two) times daily with a meal. 07/09/17  Yes Keith Rake Asad A, MD  sitaGLIPtin (JANUVIA) 100 MG tablet Take 1 tablet (100 mg total) by mouth daily. 07/09/17 01/24/19 Yes Roselee Nova, MD  Blood Glucose Monitoring Suppl (ACCU-CHEK AVIVA PLUS) w/Device KIT 1 kit by Does not apply route daily. 01/21/18   Steele Sizer, MD  gabapentin (NEURONTIN) 300 MG capsule Take 1 capsule (300 mg total) by mouth at bedtime. 02/10/18   Steele Sizer, MD  glucose blood (ACCU-CHEK AVIVA PLUS) test strip Use as instructed 01/21/18   Steele Sizer, MD  omega-3 acid ethyl esters  (LOVAZA) 1 g capsule Take 2 capsules (2 g total) by mouth 2 (two) times daily. Patient not taking: Reported on 03/11/2018 02/10/18   Steele Sizer, MD  traZODone (DESYREL) 50 MG tablet Take 0.5-1 tablets (25-50 mg total) by mouth at bedtime as needed for sleep. 01/20/18   Steele Sizer, MD    Allergies as of 03/04/2018 - Review Complete 02/26/2018  Allergen Reaction Noted  . Gadolinium derivatives Itching 10/28/2014    Family History  Problem Relation Age of Onset  . Heart attack Mother   . Diabetes Mother   . Hypertension Mother   . Heart disease Father   . Diabetes Father   . Hypertension Father   . Diabetes Sister   . Hypertension Sister     Social History   Socioeconomic History  . Marital status: Legally Separated    Spouse name: Not on file  . Number of children: 5  . Years of education: Not on file  . Highest education level: Not on file  Occupational History  . Not on file  Social Needs  . Financial resource strain: Not on file  . Food insecurity:    Worry: Not on file    Inability: Not on file  . Transportation needs:    Medical: Not on file    Non-medical: Not on file  Tobacco Use  . Smoking status: Never  Smoker  . Smokeless tobacco: Never Used  Substance and Sexual Activity  . Alcohol use: No    Alcohol/week: 0.0 standard drinks  . Drug use: No  . Sexual activity: Not Currently    Partners: Female  Lifestyle  . Physical activity:    Days per week: 0 days    Minutes per session: 0 min  . Stress: Not at all  Relationships  . Social connections:    Talks on phone: Twice a week    Gets together: Twice a week    Attends religious service: Never    Active member of club or organization: No    Attends meetings of clubs or organizations: Never    Relationship status: Separated  . Intimate partner violence:    Fear of current or ex partner: No    Emotionally abused: No    Physically abused: No    Forced sexual activity: No  Other Topics Concern    . Not on file  Social History Narrative   Lives at home with son.  Does not work.  Has significant other.        Has 5 adult sons      Brother has recently travelled to [place]      Son lives about 2hrs away.    Review of Systems: See HPI, otherwise negative ROS  Physical Exam: BP (!) 146/62   Pulse 77   Temp (!) 95.3 F (35.2 C)   Resp 18   Ht 5' 8" (1.727 m)   Wt 76.7 kg   SpO2 99%   BMI 25.70 kg/m  General:   Alert,  pleasant and cooperative in NAD Head:  Normocephalic and atraumatic. Neck:  Supple; no masses or thyromegaly. Lungs:  Clear throughout to auscultation, normal respiratory effort.    Heart:  +S1, +S2, Regular rate and rhythm, No edema. Abdomen:  Soft, nontender and nondistended. Normal bowel sounds, without guarding, and without rebound.   Neurologic:  Alert and  oriented x4;  grossly normal neurologically.  Impression/Plan: Daniel Powell is here for an colonoscopy to be performed for constipation.  Risks, benefits, limitations, and alternatives regarding  colonoscopy have been reviewed with the patient.  Questions have been answered.  All parties agreeable.    , MD  03/11/2018, 2:11 PM  

## 2018-03-14 LAB — SURGICAL PATHOLOGY

## 2018-03-15 ENCOUNTER — Encounter: Payer: Self-pay | Admitting: Gastroenterology

## 2018-03-20 ENCOUNTER — Ambulatory Visit: Payer: Self-pay | Admitting: Family Medicine

## 2018-03-20 ENCOUNTER — Telehealth: Payer: Self-pay

## 2018-03-20 NOTE — Telephone Encounter (Signed)
-----   Message from Jonathon Bellows, MD sent at 03/15/2018  3:51 PM EDT ----- Sherald Hess inform polyp was benign, needs repeat colonoscopy in 6 months with 2 day prep as his last prep was poor and polyps may have been missed.  Birdie Hopes, MD

## 2018-03-20 NOTE — Telephone Encounter (Signed)
Called pt son, Daniel Powell regarding pt biopsy results and Dr. Georgeann Oppenheim instructions to repeat colonoscopy in 6 months. No answer, no VM

## 2018-04-13 ENCOUNTER — Ambulatory Visit: Payer: Self-pay | Admitting: Family Medicine

## 2018-04-16 ENCOUNTER — Telehealth: Payer: Self-pay | Admitting: Family Medicine

## 2018-04-16 NOTE — Telephone Encounter (Signed)
Requesting refill on Metformin 1000mg , Gabapentin 300mg , Glimpepiride 4mg , Jardiance 25mg , Losartain 100-12.5, Januvia 100mg , and Atorvastatin 20mg .   Pt is asking that you send to [facility] and is asking that you do two 90 day supplies.  Pt did miss his appt the other day but did reschedule for 10.25.19. I did inform him that Dr Ancil Boozer may not see this message until his appt. Pt verbalized understanding.

## 2018-04-17 ENCOUNTER — Ambulatory Visit (INDEPENDENT_AMBULATORY_CARE_PROVIDER_SITE_OTHER): Payer: Medicaid Other | Admitting: Family Medicine

## 2018-04-17 ENCOUNTER — Encounter: Payer: Self-pay | Admitting: Family Medicine

## 2018-04-17 VITALS — BP 138/66 | HR 82 | Temp 98.2°F | Resp 16 | Ht 68.0 in | Wt 152.2 lb

## 2018-04-17 DIAGNOSIS — Z23 Encounter for immunization: Secondary | ICD-10-CM | POA: Diagnosis not present

## 2018-04-17 DIAGNOSIS — R809 Proteinuria, unspecified: Secondary | ICD-10-CM

## 2018-04-17 DIAGNOSIS — E785 Hyperlipidemia, unspecified: Secondary | ICD-10-CM

## 2018-04-17 DIAGNOSIS — F331 Major depressive disorder, recurrent, moderate: Secondary | ICD-10-CM

## 2018-04-17 DIAGNOSIS — E781 Pure hyperglyceridemia: Secondary | ICD-10-CM

## 2018-04-17 DIAGNOSIS — E1129 Type 2 diabetes mellitus with other diabetic kidney complication: Secondary | ICD-10-CM

## 2018-04-17 DIAGNOSIS — I10 Essential (primary) hypertension: Secondary | ICD-10-CM

## 2018-04-17 DIAGNOSIS — E1165 Type 2 diabetes mellitus with hyperglycemia: Secondary | ICD-10-CM

## 2018-04-17 DIAGNOSIS — F09 Unspecified mental disorder due to known physiological condition: Secondary | ICD-10-CM

## 2018-04-17 DIAGNOSIS — E1140 Type 2 diabetes mellitus with diabetic neuropathy, unspecified: Secondary | ICD-10-CM

## 2018-04-17 MED ORDER — EMPAGLIFLOZIN 25 MG PO TABS: 25.0000 mg | ORAL_TABLET | Freq: Every day | ORAL | 1 refills | Status: DC

## 2018-04-17 MED ORDER — GLIPIZIDE ER 5 MG PO TB24: 5.0000 mg | ORAL_TABLET | Freq: Every day | ORAL | 0 refills | Status: DC

## 2018-04-17 MED ORDER — ATORVASTATIN CALCIUM 20 MG PO TABS: 20.0000 mg | ORAL_TABLET | Freq: Every day | ORAL | 1 refills | Status: DC

## 2018-04-17 MED ORDER — METFORMIN HCL 1000 MG PO TABS: 1000.0000 mg | ORAL_TABLET | Freq: Two times a day (BID) | ORAL | 1 refills | Status: DC

## 2018-04-17 MED ORDER — SITAGLIPTIN PHOSPHATE 100 MG PO TABS: 100.0000 mg | ORAL_TABLET | Freq: Every day | ORAL | 0 refills | Status: DC

## 2018-04-17 MED ORDER — GABAPENTIN 300 MG PO CAPS: 300.0000 mg | ORAL_CAPSULE | Freq: Two times a day (BID) | ORAL | 1 refills | Status: DC

## 2018-04-17 MED ORDER — LOSARTAN POTASSIUM-HCTZ 100-12.5 MG PO TABS: 1.0000 | ORAL_TABLET | Freq: Every day | ORAL | 1 refills | Status: DC

## 2018-04-17 NOTE — Progress Notes (Signed)
Name: Daniel Powell   MRN: [id number]    DOB: 08-30-1955   Date:04/17/2018       Progress Note  Subjective  Chief Complaint  Chief Complaint  Patient presents with  . Diabetes    When he eats BS is 120 Low-64  . Depression  . Hypertension  . Dyslipidemia  . Follow-up    2 month F/U    HPI  Using translator from [facility] today, in person. Daniel Powell   DMII: he has been taking medication as prescribed. He denies side effects. He states glucose at home has dropped to 60's fasting a couple of times, but usually in the 120 range, we will try switching from Amaryl to Glipizide . He denies polyphagia, polydipsia, but he has polyuria but is stable. .   Insomnia: he has not seen psychiatrist yet, he states he forgets when he gets home, we gave him a written/translated form with names and address of the physicians he needs to see. He stopped Trazodone for sleep because he said he takes too many pills already.  Abdominal pain: resolved, he went to [facility] back in 08/02 and lipase was slightly elevated, but Dr. Vicente Powell felt symptoms were related to fatty liver and constipation. He was advised to take Colace and PPI and monitor. He states pain has resolved. He still strain's at time, [facility] 4 every other day. No blood in stools, he had a colonoscopy but it was a poor prep and will go back in 6 months, polypectomy did not show cancer  Depression: currently living with his brother that is dispensing his medications. He does not speak English and seems isolated.  He needs to see Dr. Shea Powell. He is no longer taking prozac and phq 9 is high   Dyslipidemia: He states he is back on lovaza and denies side effects of medications, he will return fasting for labs in one week.   HTN: bp is at goal today, no chest pain or palpitation    Patient Active Problem List   Diagnosis Date Noted  . Cholelithiasis without cholecystitis 01/24/2018  . Hepatic steatosis 01/24/2018  . Right inguinal hernia 01/24/2018  .  Abdominal pain 01/23/2018  . Abdominal distention   . Acute pancreatitis   . Major depression, recurrent ([place]) 01/20/2018  . HTN (hypertension), benign 04/03/2017  . Hypertriglyceridemia 07/27/2015  . Diabetes mellitus type 2, uncontrolled ([place]) 03/01/2015  . Dyslipidemia 03/01/2015  . Back pain, thoracic 03/01/2015  . Persistent headaches 03/01/2015  . Folliculitis 11/57/2620  . Fracture of lumbar spine ([place]) 11/05/2013    Past Surgical History:  Procedure Laterality Date  . BACK SURGERY  2014  . COLONOSCOPY WITH PROPOFOL N/A 03/11/2018   Procedure: COLONOSCOPY WITH PROPOFOL;  Surgeon: Daniel Bellows, MD;  Location: [facility] ENDOSCOPY;  Service: Gastroenterology;  Laterality: N/A;  . HERNIA REPAIR      Family History  Problem Relation Age of Onset  . Heart attack Mother   . Diabetes Mother   . Hypertension Mother   . Heart disease Father   . Diabetes Father   . Hypertension Father   . Diabetes Sister   . Hypertension Sister   . Diabetes Son   . Diabetes Son   . Hip dysplasia Daughter     Social History   Socioeconomic History  . Marital status: Legally Separated    Spouse name: Not on file  . Number of children: 8  . Years of education: Not on file  . Highest education level: Not on file  Occupational History  . Occupation: Unemployed  Social Needs  . Financial resource strain: Not on file  . Food insecurity:    Worry: Not on file    Inability: Not on file  . Transportation needs:    Medical: Not on file    Non-medical: Not on file  Tobacco Use  . Smoking status: Never Smoker  . Smokeless tobacco: Never Used  Substance and Sexual Activity  . Alcohol use: No    Alcohol/week: 0.0 standard drinks  . Drug use: No  . Sexual activity: Not Currently    Partners: Female  Lifestyle  . Physical activity:    Days per week: 0 days    Minutes per session: 0 min  . Stress: Not at all  Relationships  . Social connections:    Talks on phone: Twice a week    Gets  together: Twice a week    Attends religious service: Never    Active member of club or organization: No    Attends meetings of clubs or organizations: Never    Relationship status: Separated  . Intimate partner violence:    Fear of current or ex partner: No    Emotionally abused: No    Physically abused: No    Forced sexual activity: No  Other Topics Concern  . Not on file  Social History Narrative   Patient and his brother live together-his brother works. Mr. Daniel Powell does not work.  Has significant other.        Has 5 adult sons and 3 daughters.      Brother has recently travelled to [place]      Son lives about 2hrs away.     Current Outpatient Medications:  .  atorvastatin (LIPITOR) 20 MG tablet, Take 1 tablet (20 mg total) by mouth daily at 6 PM. Take 1 tablet ( 20 mg total ) by mouth at bedtime., Disp: 90 tablet, Rfl: 1 .  Blood Glucose Monitoring Suppl (ACCU-CHEK AVIVA PLUS) w/Device KIT, 1 kit by Does not apply route daily., Disp: 1 kit, Rfl: 0 .  empagliflozin (JARDIANCE) 25 MG TABS tablet, Take 25 mg by mouth daily., Disp: 30 tablet, Rfl: 2 .  FLUoxetine (PROZAC) 20 MG capsule, Take 1 capsule (20 mg total) by mouth daily., Disp: 30 capsule, Rfl: 1 .  gabapentin (NEURONTIN) 300 MG capsule, Take 1 capsule (300 mg total) by mouth at bedtime., Disp: 90 capsule, Rfl: 1 .  glimepiride (AMARYL) 4 MG tablet, TAKE 1 TABLET BY MOUTH TWICE DAILY AFTER A MEAL, Disp: 180 tablet, Rfl: 1 .  glucose blood (ACCU-CHEK AVIVA PLUS) test strip, Use as instructed, Disp: 100 each, Rfl: 12 .  losartan-hydrochlorothiazide (HYZAAR) 100-12.5 MG tablet, Take 1 tablet by mouth daily., Disp: , Rfl: 1 .  metFORMIN (GLUCOPHAGE) 1000 MG tablet, Take 1 tablet (1,000 mg total) by mouth 2 (two) times daily with a meal., Disp: 180 tablet, Rfl: 1 .  omega-3 acid ethyl esters (LOVAZA) 1 g capsule, Take 2 capsules (2 g total) by mouth 2 (two) times daily. (Patient not taking: Reported on 03/11/2018), Disp: 360  capsule, Rfl: 1 .  sitaGLIPtin (JANUVIA) 100 MG tablet, Take 1 tablet (100 mg total) by mouth daily., Disp: 90 tablet, Rfl: 0 .  traZODone (DESYREL) 50 MG tablet, Take 0.5-1 tablets (25-50 mg total) by mouth at bedtime as needed for sleep., Disp: 30 tablet, Rfl: 1  Allergies  Allergen Reactions  . Gadolinium Derivatives Itching    Pt began itching across ribs and  back and neck s/p Multihance injection    I personally reviewed active problem list, medication list, allergies, family history, social history with the patient/caregiver today.   ROS  Constitutional: Negative for fever or weight change.  Respiratory: Negative for cough and shortness of breath.   Cardiovascular: Negative for chest pain or palpitations.  Gastrointestinal: Negative for abdominal pain, no bowel changes.  Musculoskeletal: Negative for gait problem or joint swelling.  Skin: Negative for rash.  Neurological: Negative for dizziness or headache.  No other specific complaints in a complete review of systems (except as listed in HPI above).  Objective  Vitals:   04/17/18 1449  BP: 138/66  Pulse: 82  Resp: 16  Temp: 98.2 F (36.8 C)  TempSrc: Oral  SpO2: 99%  Weight: 152 lb 3.2 oz (69 kg)  Height: _0  (1.727 m)    Body mass index is 23.14 kg/m.  Physical Exam  Constitutional: Patient appears well-developed and well-nourished. No distress.  HEENT: head atraumatic, normocephalic, pupils equal and reactive to light,  neck supple, throat within normal limits Cardiovascular: Normal rate, regular rhythm and normal heart sounds.  No murmur heard. No BLE edema. Pulmonary/Chest: Effort normal and breath sounds normal. No respiratory distress. Abdominal: Soft.  There is no tenderness. Psychiatric: Patient has a normal mood and affect. behavior is normal. Judgment and thought content normal.  Recent Results (from the past 2160 hour(s))  POCT HgB A1C     Status: Abnormal   Collection Time: 01/20/18  3:50 PM   Result Value Ref Range   Hemoglobin A1C  4.0 - 5.6 %   HbA1c POC (<> result, manual entry)  4.0 - 5.6 %   HbA1c, POC (prediabetic range)  5.7 - 6.4 %   HbA1c, POC (controlled diabetic range) 9.6 (A) 0.0 - 7.0 %  CBC with Differential/Platelet     Status: None   Collection Time: 01/20/18  4:32 PM  Result Value Ref Range   WBC 5.2 3.8 - 10.8 Thousand/uL   RBC 5.25 4.20 - 5.80 Million/uL   Hemoglobin 14.8 13.2 - 17.1 g/dL   HCT 43.5 38.5 - 50.0 %   MCV 82.9 80.0 - 100.0 fL   MCH 28.2 27.0 - 33.0 pg   MCHC 34.0 32.0 - 36.0 g/dL   RDW 13.3 11.0 - 15.0 %   Platelets 205 140 - 400 Thousand/uL   MPV 9.2 7.5 - 12.5 fL   Neutro Abs 3,115 1,500 - 7,800 cells/uL   Lymphs Abs 1,420 850 - 3,900 cells/uL   WBC mixed population 432 200 - 950 cells/uL   Eosinophils Absolute 172 15 - 500 cells/uL   Basophils Absolute 62 0 - 200 cells/uL   Neutrophils Relative % 59.9 %   Total Lymphocyte 27.3 %   Monocytes Relative 8.3 %   Eosinophils Relative 3.3 %   Basophils Relative 1.2 %  Lipid panel     Status: Abnormal   Collection Time: 01/20/18  4:32 PM  Result Value Ref Range   Cholesterol 161 <200 mg/dL   HDL 35 (L) >40 mg/dL   Triglycerides 371 (H) <150 mg/dL    Comment: . If a non-fasting specimen was collected, consider repeat triglyceride testing on a fasting specimen if clinically indicated.  Yates Decamp et al. J. of Clin. Lipidol. 4287;6:811-572. Marland Kitchen    LDL Cholesterol (Calc) 81 mg/dL (calc)    Comment: Reference range: <100 . Desirable range <100 mg/dL for primary prevention;   <70 mg/dL for patients with CHD or diabetic patients  with > or = 2 CHD risk factors. Marland Kitchen LDL-C is now calculated using the Martin-Hopkins  calculation, which is a validated novel method providing  better accuracy than the Friedewald equation in the  estimation of LDL-C.  Cresenciano Genre et al. Annamaria Helling. 4268;341(96): 2061-2068  (http://education.[facility].com/faq/FAQ164)    Total CHOL/HDL Ratio 4.6 <5.0 (calc)    Non-HDL Cholesterol (Calc) 126 <130 mg/dL (calc)    Comment: For patients with diabetes plus 1 major ASCVD risk  factor, treating to a non-HDL-C goal of <100 mg/dL  (LDL-C of <70 mg/dL) is considered a therapeutic  option.   HIV antibody     Status: None   Collection Time: 01/20/18  4:32 PM  Result Value Ref Range   HIV 1&2 Ab, 4th Generation NON-REACTIVE NON-REACTI    Comment: HIV-1 antigen and HIV-1/HIV-2 antibodies were not detected. There is no laboratory evidence of HIV infection. Marland Kitchen PLEASE NOTE: This information has been disclosed to you from records whose confidentiality may be protected by state law.  If your state requires such protection, then the state law prohibits you from making any further disclosure of the information without the specific written consent of the person to whom it pertains, or as otherwise permitted by law. A general authorization for the release of medical or other information is NOT sufficient for this purpose. . For additional information please refer to http://education.[facility].com/faq/FAQ106 (This link is being provided for informational/ educational purposes only.) . Marland Kitchen The performance of this assay has not been clinically validated in patients less than 32 years old. .   Hepatitis C Antibody     Status: None   Collection Time: 01/20/18  4:32 PM  Result Value Ref Range   Hepatitis C Ab NON-REACTIVE NON-REACTI   SIGNAL TO CUT-OFF 0.02 <1.00    Comment: . HCV antibody was non-reactive. There is no laboratory  evidence of HCV infection. . In most cases, no further action is required. However, if recent HCV exposure is suspected, a test for HCV RNA (test code [phone]) is suggested. . For additional information please refer to http://education.[facility].com/faq/FAQ22v1 (This link is being provided for informational/ educational purposes only.) .   COMPLETE METABOLIC PANEL WITH GFR     Status: Abnormal   Collection Time:  01/20/18  4:32 PM  Result Value Ref Range   Glucose, Bld 237 (H) 65 - 139 mg/dL    Comment: .        Non-fasting reference interval .    BUN 23 7 - 25 mg/dL   Creat 1.12 0.70 - 1.25 mg/dL    Comment: For patients >38 years of age, the reference limit for Creatinine is approximately 13% higher for people identified as African-American. .    GFR, Est Non African American 71 > OR = 60 mL/min/1.34m   GFR, Est African American 82 > OR = 60 mL/min/1.738m  BUN/Creatinine Ratio NOT APPLICABLE 6 - 22 (calc)   Sodium 137 135 - 146 mmol/L   Potassium 4.2 3.5 - 5.3 mmol/L   Chloride 99 98 - 110 mmol/L   CO2 28 20 - 32 mmol/L   Calcium 10.3 8.6 - 10.3 mg/dL   Total Protein 7.6 6.1 - 8.1 g/dL   Albumin 4.9 3.6 - 5.1 g/dL   Globulin 2.7 1.9 - 3.7 g/dL (calc)   AG Ratio 1.8 1.0 - 2.5 (calc)   Total Bilirubin 1.1 0.2 - 1.2 mg/dL   Alkaline phosphatase (APISO) 64 40 - 115 U/L   AST 15 10 - 35  U/L   ALT 22 9 - 46 U/L  CBC     Status: Abnormal   Collection Time: 01/23/18  8:07 AM  Result Value Ref Range   WBC 7.1 3.8 - 10.6 K/uL   RBC 4.78 4.40 - 5.90 MIL/uL   Hemoglobin 14.0 13.0 - 18.0 g/dL   HCT 39.6 (L) 40.0 - 52.0 %   MCV 82.8 80.0 - 100.0 fL   MCH 29.3 26.0 - 34.0 pg   MCHC 35.4 32.0 - 36.0 g/dL   RDW 13.9 11.5 - 14.5 %   Platelets 183 150 - 440 K/uL    Comment: Performed at [facility], [address] [place], [place] [address]  Comprehensive metabolic panel     Status: Abnormal   Collection Time: 01/23/18  8:07 AM  Result Value Ref Range   Sodium 139 135 - 145 mmol/L   Potassium 4.0 3.5 - 5.1 mmol/L   Chloride 101 98 - 111 mmol/L   CO2 27 22 - 32 mmol/L   Glucose, Bld 333 (H) 70 - 99 mg/dL   BUN 27 (H) 6 - 20 mg/dL   Creatinine, Ser 1.77 (H) 0.61 - 1.24 mg/dL   Calcium 9.2 8.9 - 10.3 mg/dL   Total Protein 6.9 6.5 - 8.1 g/dL   Albumin 4.2 3.5 - 5.0 g/dL   AST 28 15 - 41 U/L   ALT 25 0 - 44 U/L   Alkaline Phosphatase 55 38 - 126 U/L   Total Bilirubin 1.2 0.3  - 1.2 mg/dL   GFR calc non Af Amer 40 (L) >60 mL/min   GFR calc Af Amer 46 (L) >60 mL/min    Comment: (NOTE) The eGFR has been calculated using the CKD EPI equation. This calculation has not been validated in all clinical situations. eGFR's persistently <60 mL/min signify possible Chronic Kidney Disease.    Anion gap 11 5 - 15    Comment: Performed at [facility], [address] [place], [place] [address]  Lipase, blood     Status: Abnormal   Collection Time: 01/23/18  8:07 AM  Result Value Ref Range   Lipase 271 (H) 11 - 51 U/L    Comment: Performed at [facility], [address] [place], [place] [address]  Urinalysis, Complete w Microscopic     Status: Abnormal   Collection Time: 01/23/18  8:50 AM  Result Value Ref Range   Color, Urine STRAW (A) YELLOW   APPearance CLEAR (A) CLEAR   Specific Gravity, Urine 1.006 1.005 - 1.030   pH 7.0 5.0 - 8.0   Glucose, UA >=500 (A) NEGATIVE mg/dL   Hgb urine dipstick NEGATIVE NEGATIVE   Bilirubin Urine NEGATIVE NEGATIVE   Ketones, ur NEGATIVE NEGATIVE mg/dL   Protein, ur NEGATIVE NEGATIVE mg/dL   Nitrite NEGATIVE NEGATIVE   Leukocytes, UA NEGATIVE NEGATIVE   RBC / HPF 0-5 0 - 5 RBC/hpf   WBC, UA 0-5 0 - 5 WBC/hpf   Bacteria, UA NONE SEEN NONE SEEN   Squamous Epithelial / LPF NONE SEEN 0 - 5    Comment: Performed at [facility], [address] [place], [place] [address]  Lactic acid, plasma     Status: None   Collection Time: 01/23/18  2:58 PM  Result Value Ref Range   Lactic Acid, Venous 1.5 0.5 - 1.9 mmol/L    Comment: Performed at [facility], [address], [place],  [address]  Sedimentation rate     Status:  None   Collection Time: 01/23/18  2:58 PM  Result Value Ref Range   Sed Rate 13 0 - 20 mm/hr    Comment: Performed at [facility], [address] [place], [place] [address]  Glucose, capillary     Status: Abnormal   Collection Time: 01/23/18  5:24 PM  Result  Value Ref Range   Glucose-Capillary 137 (H) 70 - 99 mg/dL   Comment 1 Notify RN   Lactic acid, plasma     Status: None   Collection Time: 01/23/18  5:39 PM  Result Value Ref Range   Lactic Acid, Venous 0.9 0.5 - 1.9 mmol/L    Comment: Performed at [facility], [address] [place], [place] [address]  Glucose, capillary     Status: None   Collection Time: 01/23/18  9:26 PM  Result Value Ref Range   Glucose-Capillary 84 70 - 99 mg/dL   Comment 1 Notify RN   Glucose, capillary     Status: None   Collection Time: 01/23/18 11:59 PM  Result Value Ref Range   Glucose-Capillary 93 70 - 99 mg/dL   Comment 1 Notify RN   Lipase, blood     Status: Abnormal   Collection Time: 01/24/18  5:25 AM  Result Value Ref Range   Lipase 83 (H) 11 - 51 U/L    Comment: Performed at [facility], [address] [place], [place] [address]  Comprehensive metabolic panel     Status: Abnormal   Collection Time: 01/24/18  5:26 AM  Result Value Ref Range   Sodium 139 135 - 145 mmol/L   Potassium 3.8 3.5 - 5.1 mmol/L   Chloride 103 98 - 111 mmol/L   CO2 29 22 - 32 mmol/L   Glucose, Bld 84 70 - 99 mg/dL   BUN 18 6 - 20 mg/dL   Creatinine, Ser 1.06 0.61 - 1.24 mg/dL   Calcium 8.4 (L) 8.9 - 10.3 mg/dL   Total Protein 6.2 (L) 6.5 - 8.1 g/dL   Albumin 3.6 3.5 - 5.0 g/dL   AST 20 15 - 41 U/L   ALT 20 0 - 44 U/L   Alkaline Phosphatase 38 38 - 126 U/L   Total Bilirubin 1.4 (H) 0.3 - 1.2 mg/dL   GFR calc non Af Amer >60 >60 mL/min   GFR calc Af Amer >60 >60 mL/min    Comment: (NOTE) The eGFR has been calculated using the CKD EPI equation. This calculation has not been validated in all clinical situations. eGFR's persistently <60 mL/min signify possible Chronic Kidney Disease.    Anion gap 7 5 - 15    Comment: Performed at [facility], [address] [place],  [address]  CBC     Status: Abnormal   Collection Time: 01/24/18  5:26 AM  Result Value Ref Range   WBC  5.6 3.8 - 10.6 K/uL   RBC 4.39 (L) 4.40 - 5.90 MIL/uL   Hemoglobin 13.0 13.0 - 18.0 g/dL   HCT 36.5 (L) 40.0 - 52.0 %   MCV 83.0 80.0 - 100.0 fL   MCH 29.5 26.0 - 34.0 pg   MCHC 35.6 32.0 - 36.0 g/dL   RDW 13.8 11.5 - 14.5 %   Platelets 174 150 - 440 K/uL    Comment: Performed at [facility], [address] [place],  [address]  Glucose, capillary     Status: None   Collection Time: 01/24/18  7:54 AM  Result Value  Ref Range   Glucose-Capillary 83 70 - 99 mg/dL   Comment 1 Notify RN   Glucose, capillary     Status: None   Collection Time: 01/24/18 11:40 AM  Result Value Ref Range   Glucose-Capillary 79 70 - 99 mg/dL   Comment 1 Notify RN   Glucose, capillary     Status: Abnormal   Collection Time: 03/11/18  2:05 PM  Result Value Ref Range   Glucose-Capillary 141 (H) 70 - 99 mg/dL  Surgical pathology     Status: None   Collection Time: 03/11/18  2:38 PM  Result Value Ref Range   SURGICAL PATHOLOGY      Surgical Pathology CASE: [id number] PATIENT: [facility] Surgical Pathology Report     SPECIMEN SUBMITTED: A. Colon polyp, descending; cbx  CLINICAL HISTORY: None provided  PRE-OPERATIVE DIAGNOSIS: Screening for colon cancer Z12.11  POST-OPERATIVE DIAGNOSIS: None provided.     DIAGNOSIS: A. COLON POLYP, DESCENDING; COLD BIOPSY: - NONSPECIFIC CRYPT HYPERPLASIA AND EDEMA. - NEGATIVE FOR DYSPLASIA AND MALIGNANCY (ADDITIONAL DEEPER SECTIONS REVIEWED).   GROSS DESCRIPTION: A. Labeled: Cbx descending colon polyp Received: in formalin Tissue fragment(s): 1 Size: 0.5 cm Description: Tan-brown fragment Entirely submitted in one cassette.   Final Diagnosis performed by [place] Lemma, MD.   Electronically signed 03/14/2018 12:13:39PM The electronic signature indicates that the named Attending Pathologist has evaluated the specimen  Technical component performed at [facility], [address] [place], [place] [address] Lab: [phone] Dir: Rush Farmer, MD,  MMM  Professional component performed at [facility], [facility], [address], [place], [place] [address] Lab: [phone] Dir: Dellia Nims. Rubinas, MD       PHQ2/9: Depression screen [facility] 2/9 04/17/2018 01/20/2018 07/09/2017 07/02/2017 06/25/2017  Decreased Interest 2 0 0 0 0  Down, Depressed, Hopeless 1 2 0 0 0  PHQ - 2 Score 3 2 0 0 0  Altered sleeping 3 3 - - -  Tired, decreased energy 2 3 - - -  Change in appetite 1 0 - - -  Feeling bad or failure about yourself  1 0 - - -  Trouble concentrating 3 1 - - -  Moving slowly or fidgety/restless 1 1 - - -  Suicidal thoughts 0 0 - - -  PHQ-9 Score 14 10 - - -  Difficult doing work/chores Very difficult Not difficult at all - - -    Fall Risk: Fall Risk  02/10/2018 01/20/2018 07/09/2017 07/02/2017 06/25/2017  Falls in the past year? _0       Assessment & Plan  1. Microalbuminuria due to type 2 diabetes mellitus (HCC)  - COMPLETE METABOLIC PANEL WITH GFR - Hemoglobin A1c  2. Needs flu shot  - Flu Vaccine QUAD 36+ mos IM  3. Need for vaccination for pneumococcus  - Pneumococcal polysaccharide vaccine 23-valent greater than or equal to 2yo subcutaneous/IM  4. HTN (hypertension), benign  - COMPLETE METABOLIC PANEL WITH GFR - Lipid panel  5. Dyslipidemia   6. Hypertriglyceridemia  - Lipid panel  7. MDD (major depressive disorder), recurrent episode, moderate ([place])  Needs to see psychiatrist   8. Mild cognitive disorder

## 2018-04-30 ENCOUNTER — Other Ambulatory Visit: Payer: Self-pay | Admitting: Family Medicine

## 2018-04-30 NOTE — Telephone Encounter (Signed)
All rx were given to him during his visit on the 25th

## 2018-05-26 ENCOUNTER — Ambulatory Visit: Payer: Medicaid Other | Admitting: Gastroenterology

## 2018-07-24 ENCOUNTER — Ambulatory Visit: Payer: Self-pay | Admitting: Family Medicine

## 2018-10-15 ENCOUNTER — Encounter: Payer: Self-pay | Admitting: Family Medicine

## 2019-03-16 IMAGING — MR MR HEAD W/O CM
11 series · 48 of 48 positions shown · non-contrast
Comparison: 10/28/2014

CLINICAL DATA: Headaches and memory loss. Symptoms for 5 years but
worse in the past year.

EXAM:
MRI HEAD WITHOUT CONTRAST
TECHNIQUE: Multiplanar, multiecho pulse sequences of the brain and surrounding
structures were obtained without intravenous contrast.

[Series 2: T1 · sagittal · 5.0mm · 0.45mm/px · 3 of 25 slices shown (1 of 2)]
[im 1/25]
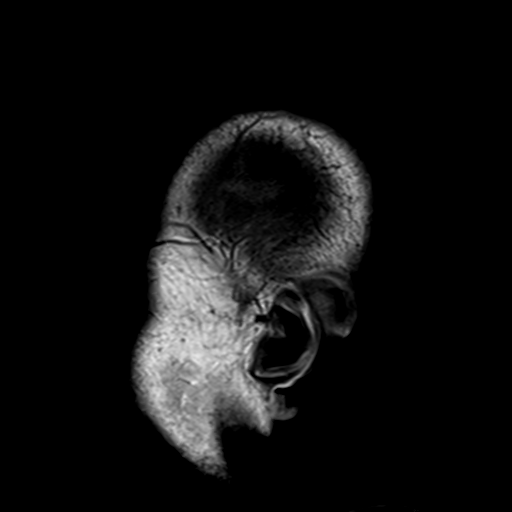
[im 13/25]
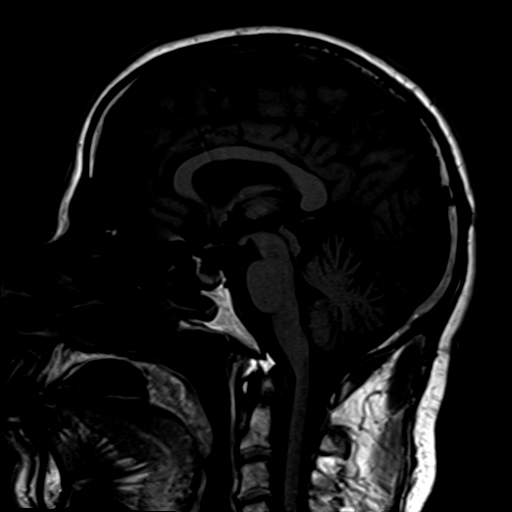
[im 25/25]
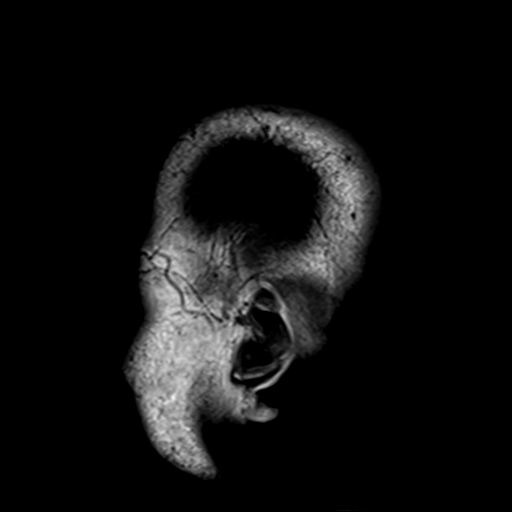

[Series 4: DWI · axial · 3.0mm · 1.80mm/px · z∈[-37,+124]mm · 4 of 54 slices shown (1 of 2)]
[im 1/54]
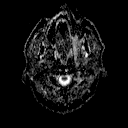
[im 18/54]
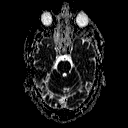
[im 36/54]
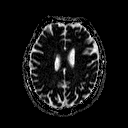
[im 54/54]
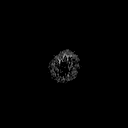

[Series 6: DWI · coronal · 3.0mm · 1.80mm/px · 4 of 45 slices shown (2 of 2)]
[im 1/45]
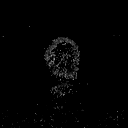
[im 15/45]
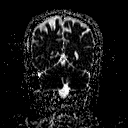
[im 30/45]
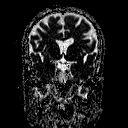
[im 45/45]
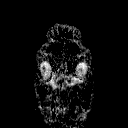

[Series 7: T2 · axial · 5.0mm · 0.60mm/px · z∈[-34,+121]mm · 2 of 25 slices shown (1 of 3)]
[im 1/25]
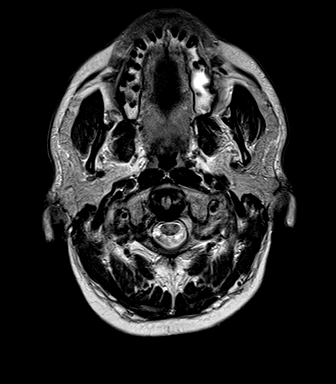
[im 25/25]
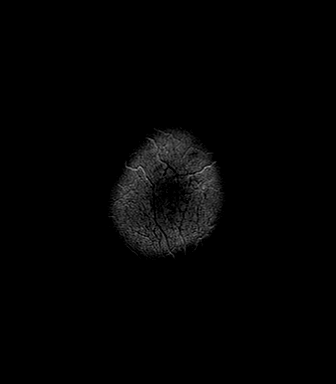

[Series 8: FLAIR · axial · 3.0mm · 0.45mm/px · z∈[-34,+121]mm · 4 of 53 slices shown (1 of 2)]
[im 1/53]
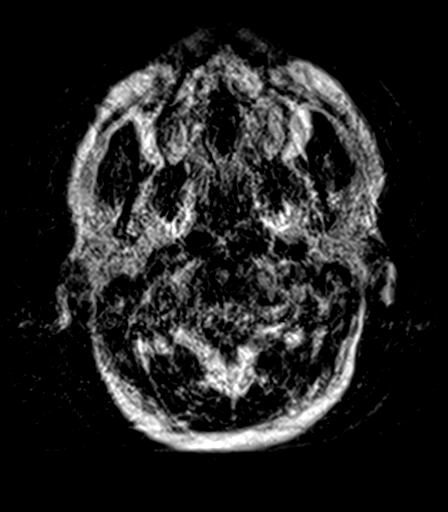
[im 18/53]
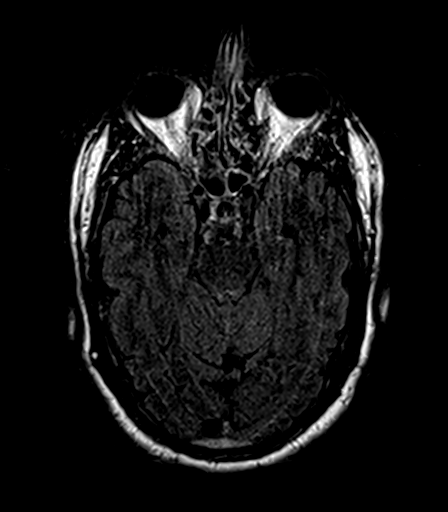
[im 35/53]
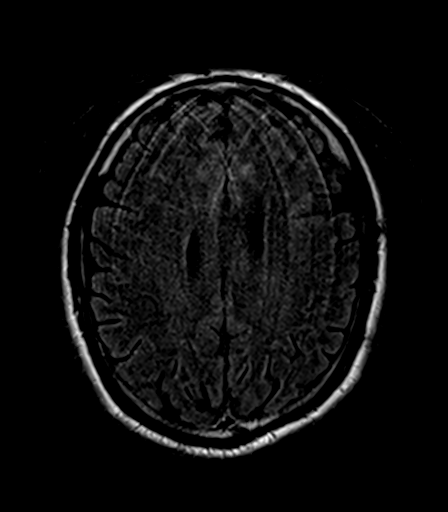
[im 53/53]
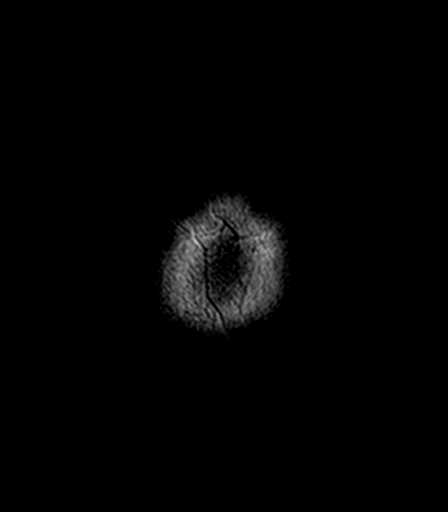

[Series 9: T2 · axial · 5.0mm · 0.45mm/px · z∈[-34,+121]mm · 2 of 25 slices shown (2 of 3)]
[im 1/25]
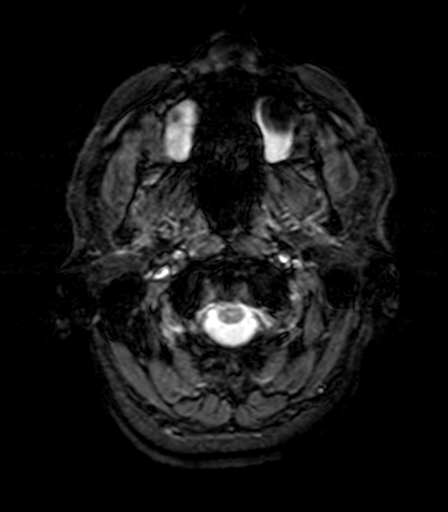
[im 25/25]
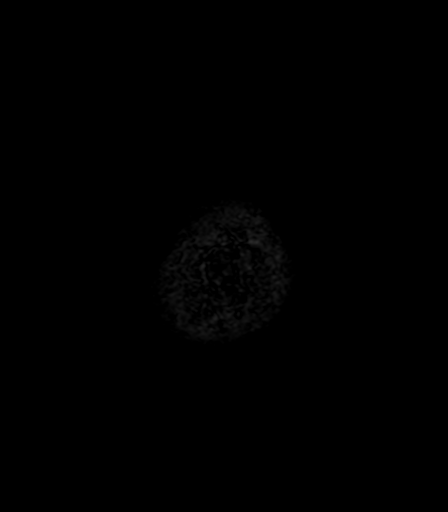

[Series 10: T1 · axial · 1.0mm · 1.00mm/px · z∈[-43,+132]mm · 14 of 176 slices shown (2 of 2)]
[im 1/176]
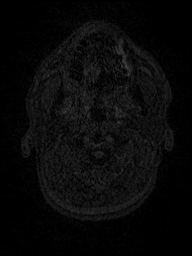
[im 14/176]
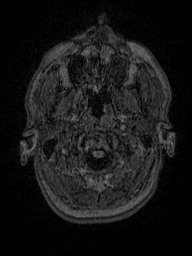
[im 27/176]
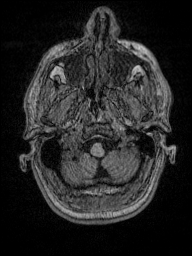
[im 41/176]
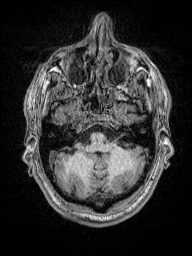
[im 54/176]
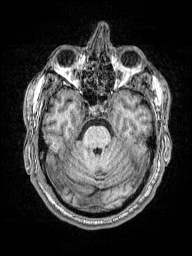
[im 68/176]
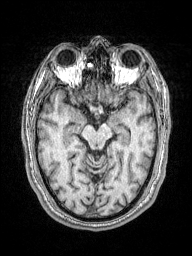
[im 81/176]
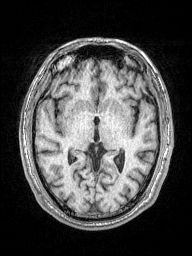
[im 95/176]
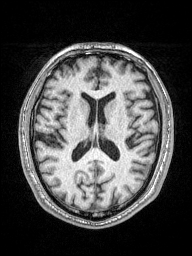
[im 108/176]
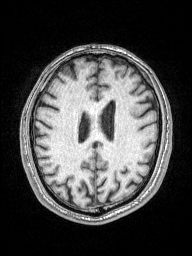
[im 122/176]
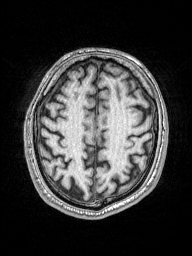
[im 135/176]
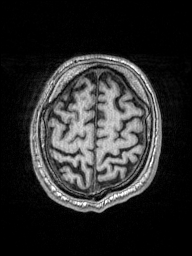
[im 149/176]
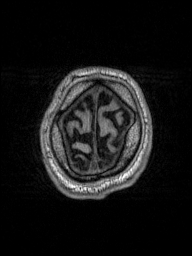
[im 162/176]
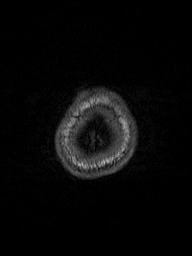
[im 176/176]
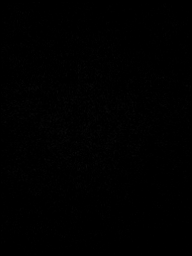

[Series 11: T2 · coronal · 5.0mm · 0.49mm/px · 2 of 27 slices shown (3 of 3)]
[im 1/27]
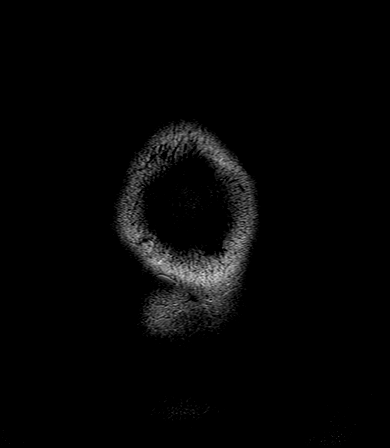
[im 27/27]
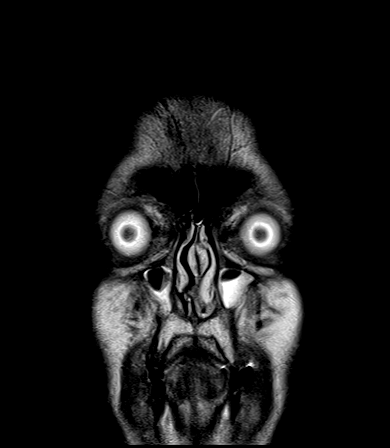

[Series 12: FLAIR · axial · 3.0mm · 0.45mm/px · z∈[-34,+121]mm · 4 of 53 slices shown (2 of 2)]
[im 1/53]
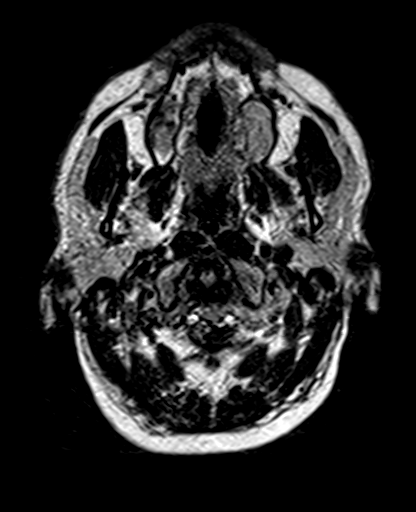
[im 18/53]
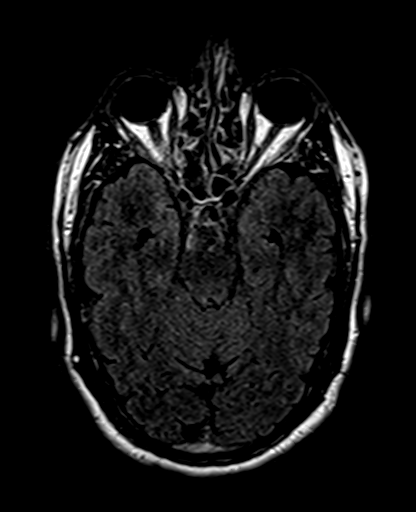
[im 35/53]
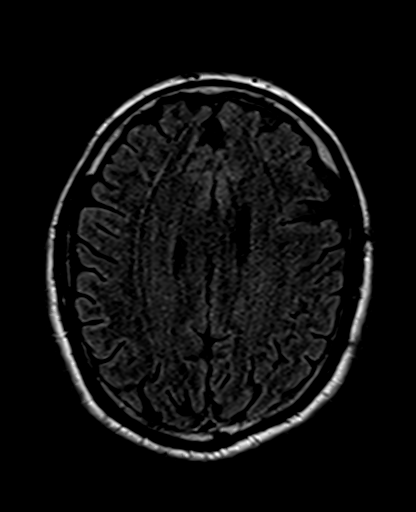
[im 53/53]
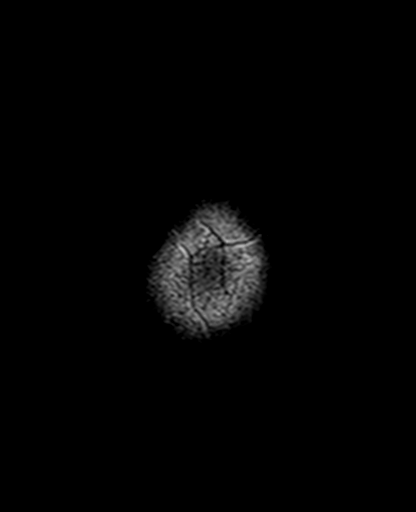

[Series 100: ax (id) · axial · 3.0mm · 1.80mm/px · z∈[-37,+124]mm · 5 of 55 slices shown]
[im 1/55]
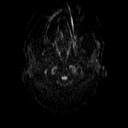
[im 14/55]
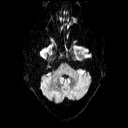
[im 28/55]
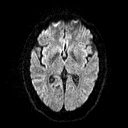
[im 41/55]
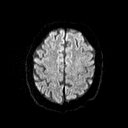
[im 55/55]
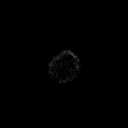

[Series 101: cor (id) · coronal · 3.0mm · 1.80mm/px · 4 of 45 slices shown]
[im 1/45]
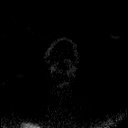
[im 15/45]
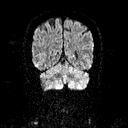
[im 30/45]
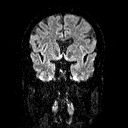
[im 45/45]
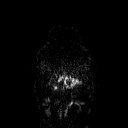

[48 of 48 positions shown; findings below may reference images not displayed]

FINDINGS: Brain: There is no evidence of acute infarct, acute intracranial
hemorrhage, mass, midline shift, or extra-axial fluid collection. A
punctate focus of susceptibility artifact in the subcortical right
frontal lobe is unchanged and may reflect a chronic microhemorrhage
or calcification. No significant cerebral white matter disease is
seen. The ventricles and sulci are normal without age advanced or
lobar predominant atrophy.

Vascular: Major intracranial vascular flow voids are preserved.

Skull and upper cervical spine: Unremarkable bone marrow signal.

Sinuses/Orbits: Unremarkable orbits. Mild mucosal thickening in the
ethmoid air cells has improved. Mild sphenoid sinus mucosal
thickening is stable to slightly increased. Large bilateral
maxillary sinus mucous retention cysts are similar to the prior
study. The mastoid air cells are clear.

Other: None.
IMPRESSION: 1. Unchanged and largely unremarkable appearance of the brain.
2. Chronic inflammatory sinus disease.

## 2019-12-23 DIAGNOSIS — Z419 Encounter for procedure for purposes other than remedying health state, unspecified: Secondary | ICD-10-CM | POA: Diagnosis not present

## 2019-12-31 ENCOUNTER — Telehealth: Payer: Self-pay | Admitting: Family Medicine

## 2019-12-31 NOTE — Telephone Encounter (Signed)
Last refills were in 2019, so patient has been out.  Will give refills at appointment.

## 2019-12-31 NOTE — Progress Notes (Deleted)
Patient is a 64 year old male patient of Dr. Ancil Boozer Last visit with Dr. Ancil Boozer was in October 2019. Labs were ordered to be obtained after that visit, but they were not completed. He contacted the office stating he needed refills of all of his medications and a follow-up was made today. He follows up today. Patient does not speak English and an interpreter is present to help   DMII: Medication regimen-glipizide  he has been taking medication as prescribed.  He does check blood sugars at home, has dropped to 60's fasting a couple of times, but usually in the 120 range. Lab Results  Component Value Date   HGBA1C 9.6 (A) 01/20/2018   HGBA1C 8.2 07/09/2017   HGBA1C 8.4 04/04/2017   Lab Results  Component Value Date   MICROALBUR 41.6 07/09/2017   LDLCALC 81 01/20/2018   CREATININE 1.06 01/24/2018    He denies polyphagia, polydipsia, he has polyuria but is stable.    Hyperlipidemia:  Medication regimen-lovaza  Taking medication regularly. Lab Results  Component Value Date   CHOL 161 01/20/2018   HDL 35 (L) 01/20/2018   LDLCALC 81 01/20/2018   TRIG 371 (H) 01/20/2018   CHOLHDL 4.6 01/20/2018    HTN: Medication regimen- Takes his medication regularly  BP Readings from Last 3 Encounters:  04/17/18 138/66  03/11/18 122/62  02/26/18 127/69   Denies chest pains, palpitations, shortness of breath, increased lower extremity swelling, increased headaches, vision changes  Depression/Insomnia: currently living with his brother that is dispensing his medications. He does not speak English and seems isolated. He needs to see psychiatry which was recommended previously although he has not seen. He is no longer taking prozac and phq 9 is high.  He stopped trazodone for sleep as he stated he was taking too many pills last visit  Abdominal pain: resolved, he went to [facility] back in 08/02 and lipase was slightly elevated, but Dr. Vicente Males felt symptoms were related to fatty liver and  constipation. He was advised to take Colace and PPI and monitor. He states pain has resolved. He still strain's at time, [facility] 4 every other day. No blood in stools, he had a colonoscopy but it was a poor prep and was to go back in 6 months, polypectomy did not show cancer.

## 2019-12-31 NOTE — Telephone Encounter (Signed)
Pt needs refill on all of his medications. Pt has an appt with Dr Roxan Hockey for [facility] 01/04/2020

## 2020-01-04 ENCOUNTER — Ambulatory Visit: Payer: Medicaid Other | Admitting: Internal Medicine

## 2020-01-05 ENCOUNTER — Other Ambulatory Visit: Payer: Self-pay | Admitting: Family Medicine

## 2020-01-05 ENCOUNTER — Ambulatory Visit: Payer: Self-pay

## 2020-01-05 DIAGNOSIS — E781 Pure hyperglyceridemia: Secondary | ICD-10-CM

## 2020-01-05 DIAGNOSIS — F331 Major depressive disorder, recurrent, moderate: Secondary | ICD-10-CM

## 2020-01-05 DIAGNOSIS — E1140 Type 2 diabetes mellitus with diabetic neuropathy, unspecified: Secondary | ICD-10-CM

## 2020-01-05 DIAGNOSIS — G4700 Insomnia, unspecified: Secondary | ICD-10-CM

## 2020-01-05 DIAGNOSIS — E785 Hyperlipidemia, unspecified: Secondary | ICD-10-CM

## 2020-01-05 DIAGNOSIS — R809 Proteinuria, unspecified: Secondary | ICD-10-CM

## 2020-01-05 NOTE — Telephone Encounter (Signed)
Requested medication (s) are due for refill today:yes  Requested medication (s) are on the active medication list: yes  Last refill: to many to say but patient is out of all medications  Future visit scheduled: yes tomorrow Virtual  Notes to clinic:  Patient has had no show appointment this week scheduled for medication refills    Requested Prescriptions  Pending Prescriptions Disp Refills   atorvastatin (LIPITOR) 20 MG tablet 90 tablet 1    Sig: Take 1 tablet (20 mg total) by mouth daily at 6 PM. Take 1 tablet ( 20 mg total ) by mouth at bedtime.      Cardiovascular:  Antilipid - Statins Failed - 01/05/2020  4:03 PM      Failed - Total Cholesterol in normal range and within 360 days    Cholesterol, Total  Date Value Ref Range Status  03/02/2015 190 100 - 199 mg/dL Final   Cholesterol  Date Value Ref Range Status  01/20/2018 161 <200 mg/dL Final          Failed - LDL in normal range and within 360 days    LDL Cholesterol (Calc)  Date Value Ref Range Status  01/20/2018 81 mg/dL (calc) Final    Comment:    Reference range: <100 . Desirable range <100 mg/dL for primary prevention;   <70 mg/dL for patients with CHD or diabetic patients  with > or = 2 CHD risk factors. Marland Kitchen LDL-C is now calculated using the Martin-Hopkins  calculation, which is a validated novel method providing  better accuracy than the Friedewald equation in the  estimation of LDL-C.  Cresenciano Genre et al. Annamaria Helling. 6286;381(77): 2061-2068  (http://education.Quest[employer].com/faq/FAQ164)           Failed - HDL in normal range and within 360 days    HDL  Date Value Ref Range Status  01/20/2018 35 (L) >40 mg/dL Final  03/02/2015 30 (L) >39 mg/dL Final    Comment:    According to ATP-III Guidelines, HDL-C >59 mg/dL is considered a negative risk factor for CHD.           Failed - Triglycerides in normal range and within 360 days    Triglycerides  Date Value Ref Range Status  01/20/2018 371 (H) <150  mg/dL Final    Comment:    . If a non-fasting specimen was collected, consider repeat triglyceride testing on a fasting specimen if clinically indicated.  Yates Decamp et al. J. of Clin. Lipidol. 1165;7:903-833. .           Failed - Valid encounter within last 12 months    Recent Outpatient Visits           1 year ago Microalbuminuria due to type 2 diabetes mellitus [facility])   [facility] [place], Drue Stager, MD   1 year ago Uncontrolled type 2 diabetes mellitus with hyperglycemia [facility])   [facility] [place], Drue Stager, MD   1 year ago Uncontrolled type 2 diabetes mellitus with hyperglycemia [facility])   [facility] Steele Sizer, MD   2 years ago Uncontrolled type 2 diabetes mellitus with hyperglycemia [facility])   Zuehl, MD   2 years ago Viral bronchitis   [place], MD       Future Appointments             Tomorrow Delsa Grana, [facility]  Passed - Patient is not pregnant        Blood Glucose Monitoring Suppl (ACCU-CHEK AVIVA PLUS) w/Device KIT 1 kit 0    Sig: 1 kit by Does not apply route daily.      Endocrinology: Diabetes - Testing Supplies Failed - 01/05/2020  4:03 PM      Failed - Valid encounter within last 12 months    Recent Outpatient Visits           1 year ago Microalbuminuria due to type 2 diabetes mellitus [facility])   [facility] [place], Drue Stager, MD   1 year ago Uncontrolled type 2 diabetes mellitus with hyperglycemia [facility])   [facility] [place], Drue Stager, MD   1 year ago Uncontrolled type 2 diabetes mellitus with hyperglycemia [facility])   [facility] [place], Drue Stager, MD   2 years ago Uncontrolled type 2 diabetes mellitus with hyperglycemia [facility])   [facility] Roselee Nova, MD   2 years ago Viral bronchitis    [place], MD       Future Appointments             Tomorrow Delsa Grana, PA-C [facility]              empagliflozin (JARDIANCE) 25 MG TABS tablet 90 tablet 1    Sig: Take 1 tablet (25 mg total) by mouth daily.      Endocrinology:  Diabetes - SGLT2 Inhibitors Failed - 01/05/2020  4:03 PM      Failed - Cr in normal range and within 360 days    Creat  Date Value Ref Range Status  01/20/2018 1.12 0.70 - 1.25 mg/dL Final    Comment:    For patients >37 years of age, the reference limit for Creatinine is approximately 13% higher for people identified as African-American. .    Creatinine, Ser  Date Value Ref Range Status  01/24/2018 1.06 0.61 - 1.24 mg/dL Final   Creatinine, Urine  Date Value Ref Range Status  07/09/2017 132 20 - 320 mg/dL Final          Failed - LDL in normal range and within 360 days    LDL Cholesterol (Calc)  Date Value Ref Range Status  01/20/2018 81 mg/dL (calc) Final    Comment:    Reference range: <100 . Desirable range <100 mg/dL for primary prevention;   <70 mg/dL for patients with CHD or diabetic patients  with > or = 2 CHD risk factors. Marland Kitchen LDL-C is now calculated using the Martin-Hopkins  calculation, which is a validated novel method providing  better accuracy than the Friedewald equation in the  estimation of LDL-C.  Cresenciano Genre et al. Annamaria Helling. 7824;235(36): 2061-2068  (http://education.[facility].com/faq/FAQ164)           Failed - HBA1C is between 0 and 7.9 and within 180 days    HbA1c, POC (controlled diabetic range)  Date Value Ref Range Status  01/20/2018 9.6 (A) 0.0 - 7.0 % Final          Failed - eGFR in normal range and within 360 days    GFR, Est African American  Date Value Ref Range Status  01/20/2018 82 > OR = 60 mL/min/1.27m Final   GFR calc Af Amer  Date Value Ref Range Status  01/24/2018 >60 >60 mL/min Final    Comment:    (NOTE) The  eGFR has been  calculated using the CKD EPI equation. This calculation has not been validated in all clinical situations. eGFR's persistently <60 mL/min signify possible Chronic Kidney Disease.    GFR, Est Non African American  Date Value Ref Range Status  01/20/2018 71 > OR = 60 mL/min/1.24m Final   GFR calc non Af Amer  Date Value Ref Range Status  01/24/2018 >60 >60 mL/min Final          Failed - Valid encounter within last 6 months    Recent Outpatient Visits           1 year ago Microalbuminuria due to type 2 diabetes mellitus ([facility]   [facility]   1 year ago Uncontrolled type 2 diabetes mellitus with hyperglycemia ([facility]   [facility]   1 year ago Uncontrolled type 2 diabetes mellitus with hyperglycemia ([facility]   [facility]   2 years ago Uncontrolled type 2 diabetes mellitus with hyperglycemia ([facility]   [facility]   2 years ago Viral bronchitis   [place] [place] MD       Future Appointments             Tomorrow TDelsa Grana PA-C [facility]              gabapentin (NEURONTIN) 300 MG capsule 180 capsule 1    Sig: Take 1 capsule (300 mg total) by mouth 2 (two) times daily.      Neurology: Anticonvulsants - gabapentin Failed - 01/05/2020  4:03 PM      Failed - Valid encounter within last 12 months    Recent Outpatient Visits           1 year ago Microalbuminuria due to type 2 diabetes mellitus ([facility]   [facility]ue St[facility]   1 year ago Uncontrolled type 2 diabetes mellitus with hyperglycemia ([facility]   [facility] K[facility]ue Stager MD   1 year ago Uncontrolled type 2 diabetes mellitus with hyperglycemia ([facility]   [facility] KDrue Stager MD   2 years ago Uncontrolled type 2  diabetes mellitus with hyperglycemia ([facility]   [facility] Nova MD   2 years ago Viral bronchitis   [place] MD       Future Appointments             Tomorrow TDelsa Grana PA-C [facility]              glipiZIDE (GLUCOTROL XL) 5 MG 24 hr tablet 90 tablet 0    Sig: Take 1 tablet (5 mg total) by mouth daily with breakfast. In place of glyburide      Endocrinology:  Diabetes - Sulfonylureas Failed - 01/05/2020  4:03 PM      Failed - HBA1C is between 0 and 7.9 and within 180 days    HbA1c, POC (controlled diabetic range)  Date Value Ref Range Status  01/20/2018 9.6 (A) 0.0 - 7.0 % Final          Failed - Valid encounter within last 6 months    Recent Outpatient Visits           1 year ago Microalbuminuria due to type 2 diabetes mellitus ([place]   [facility] Steele Sizer, MD   1 year ago Uncontrolled type 2 diabetes mellitus with hyperglycemia [facility])   [facility] [place], Drue Stager, MD   1 year ago Uncontrolled type 2 diabetes mellitus with hyperglycemia [facility])   [facility] [place], Drue Stager, MD   2 years ago Uncontrolled type 2 diabetes mellitus with hyperglycemia [facility])   [place], MD   2 years ago Viral bronchitis   [place], MD       Future Appointments             Tomorrow Delsa Grana, PA-C [facility], [facility]              glucose blood (ACCU-CHEK AVIVA PLUS) test strip 100 each 12    Sig: Use as instructed      Endocrinology: Diabetes - Testing Supplies Failed - 01/05/2020  4:03 PM      Failed - Valid encounter within last 12 months    Recent Outpatient Visits           1 year ago Microalbuminuria due to type 2 diabetes mellitus [facility])   [facility] Steele Sizer, MD   1 year ago  Uncontrolled type 2 diabetes mellitus with hyperglycemia [facility])   [facility] [place], Drue Stager, MD   1 year ago Uncontrolled type 2 diabetes mellitus with hyperglycemia [facility])   [facility] [place], Drue Stager, MD   2 years ago Uncontrolled type 2 diabetes mellitus with hyperglycemia [facility])   [facility] Roselee Nova, MD   2 years ago Viral bronchitis   [place], MD       Future Appointments             Tomorrow Delsa Grana, PA-C [facility], [facility]              losartan-hydrochlorothiazide (HYZAAR) 100-12.5 MG tablet 90 tablet 1    Sig: Take 1 tablet by mouth daily.      Cardiovascular: ARB + Diuretic Combos Failed - 01/05/2020  4:03 PM      Failed - K in normal range and within 180 days    Potassium  Date Value Ref Range Status  01/24/2018 3.8 3.5 - 5.1 mmol/L Final  07/08/2014 4.1 3.5 - 5.1 mmol/L Final          Failed - Na in normal range and within 180 days    Sodium  Date Value Ref Range Status  01/24/2018 139 135 - 145 mmol/L Final  03/02/2015 143 134 - 144 mmol/L Final  07/08/2014 139 136 - 145 mmol/L Final          Failed - Cr in normal range and within 180 days    Creat  Date Value Ref Range Status  01/20/2018 1.12 0.70 - 1.25 mg/dL Final    Comment:    For patients >110 years of age, the reference limit for Creatinine is approximately 13% higher for people identified as African-American. .    Creatinine, Ser  Date Value Ref Range Status  01/24/2018 1.06 0.61 - 1.24 mg/dL Final   Creatinine, Urine  Date Value Ref Range Status  07/09/2017 132 20 - 320 mg/dL Final          Failed - Ca in normal range and within 180 days    Calcium  Date Value Ref  Range Status  01/24/2018 8.4 (L) 8.9 - 10.3 mg/dL Final   Calcium, Total  Date Value Ref Range Status  07/08/2014 9.2 8.5 - 10.1 mg/dL Final          Failed - Valid encounter  within last 6 months    Recent Outpatient Visits           1 year ago Microalbuminuria due to type 2 diabetes mellitus [facility])   [facility] [place], Drue Stager, MD   1 year ago Uncontrolled type 2 diabetes mellitus with hyperglycemia [facility])   [facility] [place], Drue Stager, MD   1 year ago Uncontrolled type 2 diabetes mellitus with hyperglycemia [facility])   [facility] [place], Drue Stager, MD   2 years ago Uncontrolled type 2 diabetes mellitus with hyperglycemia [facility])   [facility] Roselee Nova, MD   2 years ago Viral bronchitis   [place], MD       Future Appointments             Tomorrow Delsa Grana, PA-C [facility], [place] - Patient is not pregnant      Passed - Last BP in normal range    BP Readings from Last 1 Encounters:  04/17/18 138/66            metFORMIN (GLUCOPHAGE) 1000 MG tablet 180 tablet 1    Sig: Take 1 tablet (1,000 mg total) by mouth 2 (two) times daily with a meal.      Endocrinology:  Diabetes - Biguanides Failed - 01/05/2020  4:03 PM      Failed - Cr in normal range and within 360 days    Creat  Date Value Ref Range Status  01/20/2018 1.12 0.70 - 1.25 mg/dL Final    Comment:    For patients >2 years of age, the reference limit for Creatinine is approximately 13% higher for people identified as African-American. .    Creatinine, Ser  Date Value Ref Range Status  01/24/2018 1.06 0.61 - 1.24 mg/dL Final   Creatinine, Urine  Date Value Ref Range Status  07/09/2017 132 20 - 320 mg/dL Final          Failed - HBA1C is between 0 and 7.9 and within 180 days    HbA1c, POC (controlled diabetic range)  Date Value Ref Range Status  01/20/2018 9.6 (A) 0.0 - 7.0 % Final          Failed - eGFR in normal range and within 360 days    GFR, Est African American  Date Value Ref Range Status  01/20/2018  82 > OR = 60 mL/min/1.6m Final   GFR calc Af Amer  Date Value Ref Range Status  01/24/2018 >60 >60 mL/min Final    Comment:    (NOTE) The eGFR has been calculated using the CKD EPI equation. This calculation has not been validated in all clinical situations. eGFR's persistently <60 mL/min signify possible Chronic Kidney Disease.    GFR, Est Non African American  Date Value Ref Range Status  01/20/2018 71 > OR = 60 mL/min/1.749mFinal   GFR calc non Af Amer  Date Value Ref Range Status  01/24/2018 >60 >60 mL/min Final          Failed - Valid encounter within last 6 months    Recent Outpatient Visits  1 year ago Microalbuminuria due to type 2 diabetes mellitus [facility])   [facility] [place], Drue Stager, MD   1 year ago Uncontrolled type 2 diabetes mellitus with hyperglycemia [facility])   [facility] [place], Drue Stager, MD   1 year ago Uncontrolled type 2 diabetes mellitus with hyperglycemia [facility])   [facility] [place], Drue Stager, MD   2 years ago Uncontrolled type 2 diabetes mellitus with hyperglycemia [facility])   [facility] Roselee Nova, MD   2 years ago Viral bronchitis   [place], MD       Future Appointments             Tomorrow Delsa Grana, PA-C [facility], [facility]              omega-3 acid ethyl esters (LOVAZA) 1 g capsule 360 capsule 1    Sig: Take 2 capsules (2 g total) by mouth 2 (two) times daily.      Endocrinology:  Nutritional Agents Failed - 01/05/2020  4:03 PM      Failed - Valid encounter within last 12 months    Recent Outpatient Visits           1 year ago Microalbuminuria due to type 2 diabetes mellitus [facility])   [facility] [place], Drue Stager, MD   1 year ago Uncontrolled type 2 diabetes mellitus with hyperglycemia [facility])   [facility] [place], Drue Stager, MD   1 year ago  Uncontrolled type 2 diabetes mellitus with hyperglycemia [facility])   [facility] [place], Drue Stager, MD   2 years ago Uncontrolled type 2 diabetes mellitus with hyperglycemia [facility])   [facility] Roselee Nova, MD   2 years ago Viral bronchitis   [place], MD       Future Appointments             Tomorrow Delsa Grana, PA-C [facility], [facility]              traZODone (DESYREL) 50 MG tablet 30 tablet 1    Sig: Take 0.5-1 tablets (25-50 mg total) by mouth at bedtime as needed for sleep.      Psychiatry: Antidepressants - Serotonin Modulator Failed - 01/05/2020  4:03 PM      Failed - Completed PHQ-2 or PHQ-9 in the last 360 days.      Failed - Valid encounter within last 6 months    Recent Outpatient Visits           1 year ago Microalbuminuria due to type 2 diabetes mellitus [facility])   [facility] [place], Drue Stager, MD   1 year ago Uncontrolled type 2 diabetes mellitus with hyperglycemia [facility])   [facility] [place], Drue Stager, MD   1 year ago Uncontrolled type 2 diabetes mellitus with hyperglycemia [facility])   [facility] Steele Sizer, MD   2 years ago Uncontrolled type 2 diabetes mellitus with hyperglycemia [facility])   [place], MD   2 years ago Viral bronchitis   [place], MD       Future Appointments             Tomorrow Delsa Grana, PA-C [facility], [facility]

## 2020-01-05 NOTE — Telephone Encounter (Signed)
Called patient using interpreted # [id number]. Patient needs appointment for medication refills. While talking with patient he started to cough He states that he is around smokers and he always develops cough. His cough is productive. He also has sore throat. He has just returned home from [place]  He was fully vaccinated.  He denies fever or any known exposure. He just want his medications refilled. I explained that since he traveled he should test to be certain he had not developed COVID-19. He rejected the disposition. Call placed to office for Virtual visit. I Spoke with Gloriann Loan and transferred the interpreter and caller to her for scheduling for medication refills.  Will route refills to office. Answer Assessment - Initial Assessment Questions 1. COVID-19 DIAGNOSIS: "Who made your Coronavirus (COVID-19) diagnosis?" "Was it confirmed by a positive lab test?" If not diagnosed by a HCP, ask "Are there lots of cases (community spread) where you live?" (See public health department website, if unsure)     Has traveled out of country to [place] 2. COVID-19 EXPOSURE: "Was there any known exposure to COVID before the symptoms began?" CDC Definition of close contact: within 6 feet (2 meters) for a total of 15 minutes or more over a 24-hour period.     On day he arived feels it from [facility]     3. ONSET: "When did the COVID-19 symptoms start?"      8 days ago 4. WORST SYMPTOM: "What is your worst symptom?" (e.g., cough, fever, shortness of breath, muscle aches)     Cough, sore throat 5. COUGH: "Do you have a cough?" If Yes, ask: "How bad is the cough?"       Yes congested 6. FEVER: "Do you have a fever?" If Yes, ask: "What is your temperature, how was it measured, and when did it start?"     no 7. RESPIRATORY STATUS: "Describe your breathing?" (e.g., shortness of breath, wheezing, unable to speak)     no 8. BETTER-SAME-WORSE: "Are you getting better, staying the same or getting worse compared to yesterday?"  If  getting worse, ask, "In what way?"     Present for a while exposed to smokers 9. HIGH RISK DISEASE: "Do you have any chronic medical problems?" (e.g., asthma, heart or lung disease, weak immune system, obesity, etc.)     no 10. PREGNANCY: "Is there any chance you are pregnant?" "When was your last menstrual period?"       N/A 11. OTHER SYMPTOMS: "Do you have any other symptoms?"  (e.g., chills, fatigue, headache, loss of smell or taste, muscle pain, sore throat; new loss of smell or taste especially support the diagnosis of COVID-19)      Sore throat,  Protocols used: CORONAVIRUS (COVID-19) DIAGNOSED OR SUSPECTED-A-AH

## 2020-01-05 NOTE — Telephone Encounter (Signed)
Medication Refill - Medication:   atorvastatin (LIPITOR) 20 MG tablet [[id number]]    Blood Glucose Monitoring Suppl (ACCU-CHEK AVIVA PLUS) w/Device KIT [[id number]]   empagliflozin (JARDIANCE) 25 MG TABS tablet [[id number]]   gabapentin (NEURONTIN) 300 MG capsule [[id number]]    glipiZIDE (GLUCOTROL XL) 5 MG 24 hr tablet [[id number]]    glucose blood (ACCU-CHEK AVIVA PLUS) test strip [[id number]]   losartan-hydrochlorothiazide (HYZAAR) 100-12.5 MG tablet [[id number]]    metFORMIN (GLUCOPHAGE) 1000 MG tablet [[id number]]   omega-3 acid ethyl esters (LOVAZA) 1 g capsule [[id number]]   traZODone (DESYREL) 50 MG tablet [[id number]]       Also pt stated his blood sugar machine/kit is broken and he will need another . please advise     Preferred Pharmacy (with phone number or street name):  [facility] ([facility] [address] ([place]) [place] [address]  Phone: [phone] Fax: [phone]     Agent: Please be advised that RX refills may take up to 3 business days. We ask that you follow-up with your pharmacy.

## 2020-01-06 ENCOUNTER — Emergency Department: Payer: Medicaid Other

## 2020-01-06 ENCOUNTER — Emergency Department
Admission: EM | Admit: 2020-01-06 | Discharge: 2020-01-07 | Disposition: A | Discharge status: Home / Self Care | Payer: Medicaid Other | Attending: Emergency Medicine | Admitting: Emergency Medicine

## 2020-01-06 ENCOUNTER — Telehealth (INDEPENDENT_AMBULATORY_CARE_PROVIDER_SITE_OTHER): Payer: Medicaid Other | Admitting: Family Medicine

## 2020-01-06 ENCOUNTER — Ambulatory Visit
Admission: EM | Admit: 2020-01-06 | Discharge: 2020-01-06 | Disposition: A | Discharge status: Home / Self Care | Payer: Medicaid Other | Attending: Family Medicine | Admitting: Family Medicine

## 2020-01-06 ENCOUNTER — Encounter: Payer: Self-pay | Admitting: Family Medicine

## 2020-01-06 ENCOUNTER — Other Ambulatory Visit: Payer: Self-pay

## 2020-01-06 ENCOUNTER — Encounter: Payer: Self-pay | Admitting: Emergency Medicine

## 2020-01-06 DIAGNOSIS — M7989 Other specified soft tissue disorders: Secondary | ICD-10-CM | POA: Diagnosis not present

## 2020-01-06 DIAGNOSIS — Z79899 Other long term (current) drug therapy: Secondary | ICD-10-CM | POA: Diagnosis not present

## 2020-01-06 DIAGNOSIS — Z20822 Contact with and (suspected) exposure to covid-19: Secondary | ICD-10-CM | POA: Insufficient documentation

## 2020-01-06 DIAGNOSIS — Z7951 Long term (current) use of inhaled steroids: Secondary | ICD-10-CM | POA: Diagnosis not present

## 2020-01-06 DIAGNOSIS — R05 Cough: Secondary | ICD-10-CM

## 2020-01-06 DIAGNOSIS — R059 Cough, unspecified: Secondary | ICD-10-CM

## 2020-01-06 DIAGNOSIS — J209 Acute bronchitis, unspecified: Secondary | ICD-10-CM | POA: Insufficient documentation

## 2020-01-06 DIAGNOSIS — Z7984 Long term (current) use of oral hypoglycemic drugs: Secondary | ICD-10-CM | POA: Insufficient documentation

## 2020-01-06 DIAGNOSIS — R0602 Shortness of breath: Secondary | ICD-10-CM | POA: Diagnosis not present

## 2020-01-06 DIAGNOSIS — E119 Type 2 diabetes mellitus without complications: Secondary | ICD-10-CM | POA: Diagnosis not present

## 2020-01-06 DIAGNOSIS — E1165 Type 2 diabetes mellitus with hyperglycemia: Secondary | ICD-10-CM | POA: Diagnosis not present

## 2020-01-06 DIAGNOSIS — I10 Essential (primary) hypertension: Secondary | ICD-10-CM | POA: Diagnosis not present

## 2020-01-06 DIAGNOSIS — R2243 Localized swelling, mass and lump, lower limb, bilateral: Secondary | ICD-10-CM | POA: Diagnosis not present

## 2020-01-06 LAB — COMPREHENSIVE METABOLIC PANEL
ALT: 21 U/L (ref 0–44)
AST: 20 U/L (ref 15–41)
Albumin: 4.1 g/dL (ref 3.5–5.0)
Alkaline Phosphatase: 50 U/L (ref 38–126)
Anion gap: 8 (ref 5–15)
BUN: 25 mg/dL — ABNORMAL HIGH (ref 8–23)
CO2: 29 mmol/L (ref 22–32)
Calcium: 9.2 mg/dL (ref 8.9–10.3)
Chloride: 100 mmol/L (ref 98–111)
Creatinine, Ser: 1.01 mg/dL (ref 0.61–1.24)
GFR calc Af Amer: >60 mL/min (ref >60)
GFR calc non Af Amer: >60 mL/min (ref >60)
Glucose, Bld: 219 mg/dL — ABNORMAL HIGH (ref 70–99)
Potassium: 4 mmol/L (ref 3.5–5.1)
Sodium: 137 mmol/L (ref 135–145)
Total Bilirubin: 1.3 mg/dL — ABNORMAL HIGH (ref 0.3–1.2)
Total Protein: 7.5 g/dL (ref 6.5–8.1)

## 2020-01-06 LAB — CBC WITH DIFFERENTIAL/PLATELET
Abs Immature Granulocytes: 0.01 10*3/uL (ref 0.00–0.07)
Basophils Absolute: 0 10*3/uL (ref 0.0–0.1)
Basophils Relative: 1 %
Eosinophils Absolute: 0.2 10*3/uL (ref 0.0–0.5)
Eosinophils Relative: 5 %
HCT: 40.6 % (ref 39.0–52.0)
Hemoglobin: 14.1 g/dL (ref 13.0–17.0)
Immature Granulocytes: 0 %
Lymphocytes Relative: 29 %
Lymphs Abs: 1.4 10*3/uL (ref 0.7–4.0)
MCH: 29.1 pg (ref 26.0–34.0)
MCHC: 34.7 g/dL (ref 30.0–36.0)
MCV: 83.7 fL (ref 80.0–100.0)
Monocytes Absolute: 0.5 10*3/uL (ref 0.1–1.0)
Monocytes Relative: 11 %
Neutro Abs: 2.5 10*3/uL (ref 1.7–7.7)
Neutrophils Relative %: 54 %
Platelets: 184 10*3/uL (ref 150–400)
RBC: 4.85 MIL/uL (ref 4.22–5.81)
RDW: 13.1 % (ref 11.5–15.5)
WBC: 4.6 10*3/uL (ref 4.0–10.5)
nRBC: 0 % (ref 0.0–0.2)

## 2020-01-06 LAB — SARS CORONAVIRUS 2 BY RT PCR (HOSPITAL ORDER, PERFORMED IN ~~LOC~~ HOSPITAL LAB): SARS Coronavirus 2: NEGATIVE

## 2020-01-06 LAB — BRAIN NATRIURETIC PEPTIDE: B Natriuretic Peptide: 22.4 pg/mL (ref 0.0–100.0)

## 2020-01-06 LAB — TROPONIN I (HIGH SENSITIVITY): Troponin I (High Sensitivity): 4 ng/L (ref <18)

## 2020-01-06 LAB — POCT FASTING CBG KUC MANUAL ENTRY: POCT Glucose (KUC): 376 mg/dL — AB (ref 70–99)

## 2020-01-06 LAB — FIBRIN DERIVATIVES D-DIMER (ARMC ONLY): Fibrin derivatives D-dimer (ARMC): 429.7 ng/mL (FEU) (ref 0.00–499.00)

## 2020-01-06 MED ORDER — PREDNISONE 20 MG PO TABS: 60.0000 mg | ORAL_TABLET | Freq: Every day | ORAL | 0 refills | Status: AC

## 2020-01-06 MED ORDER — LOSARTAN POTASSIUM-HCTZ 100-12.5 MG PO TABS
1.0000 | ORAL_TABLET | Freq: Every day | ORAL | 0 refills | Status: DC
Start: 2020-01-06 — End: 2020-01-20

## 2020-01-06 MED ORDER — ALBUTEROL SULFATE HFA 108 (90 BASE) MCG/ACT IN AERS
2.0000 | INHALATION_SPRAY | Freq: Four times a day (QID) | RESPIRATORY_TRACT | 0 refills | Status: DC | PRN
Start: 2020-01-06 — End: 2020-01-20

## 2020-01-06 MED ORDER — ATORVASTATIN CALCIUM 20 MG PO TABS
20.0000 mg | ORAL_TABLET | Freq: Every day | ORAL | 0 refills | Status: DC
Start: 2020-01-06 — End: 2020-01-20

## 2020-01-06 MED ORDER — ALBUTEROL SULFATE (2.5 MG/3ML) 0.083% IN NEBU
2.5000 mg | INHALATION_SOLUTION | Freq: Once | RESPIRATORY_TRACT | Status: AC
  Administered 2020-01-06: 2.5 mg via RESPIRATORY_TRACT
  Filled 2020-01-06: qty 3

## 2020-01-06 MED ORDER — GUAIFENESIN 200 MG PO TABS
400.0000 mg | ORAL_TABLET | Freq: Four times a day (QID) | ORAL | 0 refills | Status: DC | PRN
Start: 2020-01-06 — End: 2020-01-20

## 2020-01-06 MED ORDER — MUPIROCIN 2 % EX OINT: TOPICAL_OINTMENT | CUTANEOUS | 0 refills | Status: DC

## 2020-01-06 MED ORDER — PREDNISONE 20 MG PO TABS
60.0000 mg | ORAL_TABLET | Freq: Once | ORAL | Status: AC
  Administered 2020-01-06: 60 mg via ORAL
  Filled 2020-01-06: qty 3

## 2020-01-06 MED ORDER — METFORMIN HCL 1000 MG PO TABS: 1000.0000 mg | ORAL_TABLET | Freq: Two times a day (BID) | ORAL | 0 refills | Status: DC

## 2020-01-06 NOTE — Progress Notes (Signed)
Name: Daniel Powell   MRN: [id number]    DOB: 09-14-1955   Date:01/06/2020       Progress Note  Subjective:    Chief Complaint  Chief Complaint  Patient presents with  . Cough    with phlem   . Medication Refill  . Sore Throat    I connected with  Garen Grams  on 01/06/20 at 11:20 AM EDT by a video enabled telemedicine application and verified that I am speaking with the correct person using two identifiers.  I discussed the limitations of evaluation and management by telemedicine and the availability of in person appointments. The patient expressed understanding and agreed to proceed. Staff also discussed with the patient that there may be a patient responsible charge related to this service. Patient Location:  Provider Location: [facility] Additional Individuals present:    CMA on the phone with pt and interpreter for >20 min today I have attempted to connect with pt through video app, not connecting with pt The interpreter has called the pt on the telephone and finally reached pt at 11:40 after trying to reach from 11:28-11:40  HPI Pt has been ill with cough and sore throat since  10 days ago he came back from [place], the weather change and cold running AC caused him to develop sore throat and productive cough, feels very congested but unable to cough it all out. No fever, CP, SOB Pain is worse in his throat feels like burning and cough is severe Only tried tylenol, no cold/cough/congestion meds He denies SOB if he just goes around his house no problem, but SOB and fatigue if he walks up the stairs which is new for him.   Endorses some SOB and CP, difficulty breathing which is all new, recently flew in from [place], never had cough like this Pt has difficulty even speaking due to coughing, unable to provide more history      Patient Active Problem List   Diagnosis Date Noted  . Cholelithiasis without cholecystitis 01/24/2018  . Hepatic steatosis 01/24/2018  . Right inguinal  hernia 01/24/2018  . Abdominal pain 01/23/2018  . Abdominal distention   . Acute pancreatitis   . Major depression, recurrent ([place]) 01/20/2018  . HTN (hypertension), benign 04/03/2017  . Hypertriglyceridemia 07/27/2015  . Diabetes mellitus type 2, uncontrolled ([place]) 03/01/2015  . Dyslipidemia 03/01/2015  . Back pain, thoracic 03/01/2015  . Persistent headaches 03/01/2015  . Folliculitis 14/43/1540  . Fracture of lumbar spine ([place]) 11/05/2013    Social History   Tobacco Use  . Smoking status: Never Smoker  . Smokeless tobacco: Never Used  Substance Use Topics  . Alcohol use: No    Alcohol/week: 0.0 standard drinks     Current Outpatient Medications:  .  atorvastatin (LIPITOR) 20 MG tablet, Take 1 tablet (20 mg total) by mouth daily at 6 PM. Take 1 tablet ( 20 mg total ) by mouth at bedtime., Disp: 90 tablet, Rfl: 1 .  Blood Glucose Monitoring Suppl (ACCU-CHEK AVIVA PLUS) w/Device KIT, 1 kit by Does not apply route daily., Disp: 1 kit, Rfl: 0 .  empagliflozin (JARDIANCE) 25 MG TABS tablet, Take 25 mg by mouth daily., Disp: 90 tablet, Rfl: 1 .  gabapentin (NEURONTIN) 300 MG capsule, Take 1 capsule (300 mg total) by mouth 2 (two) times daily., Disp: 180 capsule, Rfl: 1 .  glipiZIDE (GLUCOTROL XL) 5 MG 24 hr tablet, Take 1 tablet (5 mg total) by mouth daily with breakfast. In place of  glyburide, Disp: 90 tablet, Rfl: 0 .  glucose blood (ACCU-CHEK AVIVA PLUS) test strip, Use as instructed, Disp: 100 each, Rfl: 12 .  losartan-hydrochlorothiazide (HYZAAR) 100-12.5 MG tablet, Take 1 tablet by mouth daily., Disp: 90 tablet, Rfl: 1 .  metFORMIN (GLUCOPHAGE) 1000 MG tablet, Take 1 tablet (1,000 mg total) by mouth 2 (two) times daily with a meal., Disp: 180 tablet, Rfl: 1 .  omega-3 acid ethyl esters (LOVAZA) 1 g capsule, Take 2 capsules (2 g total) by mouth 2 (two) times daily. (Patient not taking: Reported on 03/11/2018), Disp: 360 capsule, Rfl: 1 .  sitaGLIPtin (JANUVIA) 100 MG tablet, Take  1 tablet (100 mg total) by mouth daily., Disp: 90 tablet, Rfl: 0 .  traZODone (DESYREL) 50 MG tablet, Take 0.5-1 tablets (25-50 mg total) by mouth at bedtime as needed for sleep. (Patient not taking: Reported on 01/06/2020), Disp: 30 tablet, Rfl: 1  Allergies  Allergen Reactions  . Gadolinium Derivatives Itching    Pt began itching across ribs and back and neck s/p Multihance injection     Review of Systems  10 Systems reviewed and are negative for acute change except as noted in the HPI. Hx limited by severe coughing, pt unable to keep answering questions multiple times throughout visit  Objective:   Virtual encounter, vitals limited, only able to obtain the following Today's Vitals   01/06/20 0956  PainSc: 0-No pain   There is no height or weight on file to calculate BMI. Nursing Note and Vital Signs reviewed.  Physical Exam Pt phonation scratchy, very frequent coughing fits, often only able to speak in short sentences before another coughing fit PE limited by telephone encounter  No results found for this or any previous visit (from the past 72 hour(s)).  Assessment and Plan:     ICD-10-CM   1. Coughing  R05    frequent severe coughing fits, difficulting many times conversing during encounter, sometimes able to speak at length with interpreter  2. Shortness of breath  R06.02    difficult to get hx from patient, denies CP, fever and SOB, but then said severe coughing and DOE with going up stairs, enough sx that warrant in person eval  Pt referred to UC - feel he definitely needs in person evaluation/exam with limited hx, and may benefit from breathing tx, etc.   During visit he was able to sometimes speak at great length and would interrupt interpreter with me and with the [place], however after a few minutes into visit today with me - pt began having frequent coughing fits and could not continue talking.  He had a friend with him - we discussed ER vs UC.  If able to catch breath  and talk in short sentences UC ok If cannot speak or catch breath advised ER.   Pt will need to come to in office visit with PCP for his med refills, pt not known to me, he has not been here in 2 years, unclear if taking his meds or if they are needed or even safe to refill - did not have time today to talk about that due to respiratory sx - will need in office routine visit with PCP to get current dx/meds addressed etc.  I provided 30+ minutes of non-face-to-face time during this encounter.  Delsa Grana, PA-C 01/06/20 11:32 AM

## 2020-01-06 NOTE — ED Notes (Signed)
Patient is being discharged from the Urgent Care and sent to the Emergency Department via POV . Per Faustino Congress, NP, patient is in need of higher level of care due to hyperglycemia, uncontrolled type two diabetes, urinary symptoms, chest and abdominal pain, cough, hypertension, bilateral lower extremity edema, and bruising. Patient is aware and verbalizes understanding of plan of care.  Vitals:   01/06/20 1352  BP: (!) 156/81  Pulse: 91  Resp: 20  SpO2: 97%

## 2020-01-06 NOTE — ED Notes (Signed)
Patient discharged to home per MD order. Patient in stable condition, and deemed medically cleared by ED provider for discharge. Discharge instructions reviewed with patient/family using "Teach Back"; verbalized understanding of medication education and administration, and information about follow-up care. Denies further concerns. ° °

## 2020-01-06 NOTE — ED Triage Notes (Signed)
Patient reports chest pain, fatigue, cough, blisters on the scalp. Reports this has been going on for three months.

## 2020-01-06 NOTE — Patient Instructions (Signed)
Please go get PCR COVID test and quarantine at home until results   Go to the ER or call 911 with any severe shortness of breath or chest pain.

## 2020-01-06 NOTE — ED Notes (Signed)
US to bedside

## 2020-01-06 NOTE — ED Provider Notes (Signed)
[facility] Emergency Department Provider Note ____________________________________________   First MD Initiated Contact with Patient 01/06/20 2105     (approximate)  I have reviewed the triage vital signs and the nursing notes.   HISTORY  Chief Complaint Cough and Leg Swelling  HPI and ROS obtained via video interpreter  HPI Daniel Powell is a 64 y.o. male with PMH as noted below who presents with cough over approximately the last 10 days, nonproductive, persistent course, not associated with significant shortness of breath.  He does report associated burning in his chest over the same period.  He also has discomfort in his throat.  He denies any fever or chills and has no vomiting or diarrhea.  In addition, the patient complains of bumps to his scalp especially in the back of his head.  He also has some intermittent swelling to the left ankle and foot, although this has been happening for longer.    Past Medical History:  Diagnosis Date  . Diabetes mellitus without complication ([place])   . Hypertension     Patient Active Problem List   Diagnosis Date Noted  . Cholelithiasis without cholecystitis 01/24/2018  . Hepatic steatosis 01/24/2018  . Right inguinal hernia 01/24/2018  . Abdominal pain 01/23/2018  . Abdominal distention   . Acute pancreatitis   . Major depression, recurrent ([place]) 01/20/2018  . HTN (hypertension), benign 04/03/2017  . Hypertriglyceridemia 07/27/2015  . Diabetes mellitus type 2, uncontrolled ([place]) 03/01/2015  . Dyslipidemia 03/01/2015  . Back pain, thoracic 03/01/2015  . Persistent headaches 03/01/2015  . Folliculitis 12/18/9483  . Fracture of lumbar spine ([place]) 11/05/2013    Past Surgical History:  Procedure Laterality Date  . BACK SURGERY  2014  . COLONOSCOPY WITH PROPOFOL N/A 03/11/2018   Procedure: COLONOSCOPY WITH PROPOFOL;  Surgeon: Jonathon Bellows, MD;  Location: [facility] ENDOSCOPY;  Service: Gastroenterology;   Laterality: N/A;  . HERNIA REPAIR      Prior to Admission medications   Medication Sig Start Date End Date Taking? Authorizing Provider  albuterol (VENTOLIN HFA) 108 (90 Base) MCG/ACT inhaler Inhale 2 puffs into the lungs every 6 (six) hours as needed for wheezing or shortness of breath. 01/06/20   Arta Silence, MD  atorvastatin (LIPITOR) 20 MG tablet Take 1 tablet (20 mg total) by mouth daily at 6 PM. Take 1 tablet ( 20 mg total ) by mouth at bedtime. 04/17/18   Steele Sizer, MD  atorvastatin (LIPITOR) 20 MG tablet Take 1 tablet (20 mg total) by mouth daily. 01/06/20 02/05/20  Arta Silence, MD  Blood Glucose Monitoring Suppl (ACCU-CHEK AVIVA PLUS) w/Device KIT 1 kit by Does not apply route daily. 01/21/18   Steele Sizer, MD  empagliflozin (JARDIANCE) 25 MG TABS tablet Take 25 mg by mouth daily. 04/17/18   Steele Sizer, MD  gabapentin (NEURONTIN) 300 MG capsule Take 1 capsule (300 mg total) by mouth 2 (two) times daily. 04/17/18   Steele Sizer, MD  glipiZIDE (GLUCOTROL XL) 5 MG 24 hr tablet Take 1 tablet (5 mg total) by mouth daily with breakfast. In place of glyburide 04/17/18   Steele Sizer, MD  glucose blood (ACCU-CHEK AVIVA PLUS) test strip Use as instructed 01/21/18   Steele Sizer, MD  guaiFENesin 200 MG tablet Take 2 tablets (400 mg total) by mouth every 6 (six) hours as needed for cough. 01/06/20   Arta Silence, MD  losartan-hydrochlorothiazide (HYZAAR) 100-12.5 MG tablet Take 1 tablet by mouth daily. 04/17/18   Steele Sizer, MD  losartan-hydrochlorothiazide (  HYZAAR) 100-12.5 MG tablet Take 1 tablet by mouth daily. 01/06/20 02/05/20  Arta Silence, MD  metFORMIN (GLUCOPHAGE) 1000 MG tablet Take 1 tablet (1,000 mg total) by mouth 2 (two) times daily with a meal. 04/17/18   Ancil Boozer, Drue Stager, MD  metFORMIN (GLUCOPHAGE) 1000 MG tablet Take 1 tablet (1,000 mg total) by mouth 2 (two) times daily with a meal. 01/06/20 02/05/20  Arta Silence, MD    mupirocin ointment (BACTROBAN) 2 % Apply to affected area 3 times daily 01/06/20 01/05/21  Arta Silence, MD  omega-3 acid ethyl esters (LOVAZA) 1 g capsule Take 2 capsules (2 g total) by mouth 2 (two) times daily. Patient not taking: Reported on 03/11/2018 02/10/18   Steele Sizer, MD  predniSONE (DELTASONE) 20 MG tablet Take 3 tablets (60 mg total) by mouth daily with breakfast for 4 days. 01/06/20 01/10/20  Arta Silence, MD  sitaGLIPtin (JANUVIA) 100 MG tablet Take 1 tablet (100 mg total) by mouth daily. 04/17/18 07/16/18  Steele Sizer, MD  traZODone (DESYREL) 50 MG tablet Take 0.5-1 tablets (25-50 mg total) by mouth at bedtime as needed for sleep. Patient not taking: Reported on 01/06/2020 01/20/18   Steele Sizer, MD    Allergies Gadolinium derivatives  Family History  Problem Relation Age of Onset  . Heart attack Mother   . Diabetes Mother   . Hypertension Mother   . Heart disease Father   . Diabetes Father   . Hypertension Father   . Diabetes Sister   . Hypertension Sister   . Diabetes Son   . Diabetes Son   . Hip dysplasia Daughter     Social History Social History   Tobacco Use  . Smoking status: Never Smoker  . Smokeless tobacco: Never Used  Vaping Use  . Vaping Use: Never used  Substance Use Topics  . Alcohol use: No    Alcohol/week: 0.0 standard drinks  . Drug use: No    Review of Systems  Constitutional: No fever. Eyes: No redness. ENT: Positive for sore throat. Cardiovascular: Positive for chest pain. Respiratory: Denies shortness of breath. Gastrointestinal: No vomiting or diarrhea.  Genitourinary: Negative for dysuria.  Musculoskeletal: Negative for back pain. Skin: Negative for rash. Neurological: Negative for headache.   ____________________________________________   PHYSICAL EXAM:  VITAL SIGNS: ED Triage Vitals  Enc Vitals Group     BP 01/06/20 1617 (!) 149/75     Pulse Rate 01/06/20 1617 94     Resp 01/06/20 1617 18      Temp 01/06/20 1617 98.1 F (36.7 C)     Temp Source 01/06/20 1617 Oral     SpO2 01/06/20 1617 97 %     Weight 01/06/20 1619 165 lb (74.8 kg)     Height --      Head Circumference --      Peak Flow --      Pain Score 01/06/20 1618 8     Pain Loc --      Pain Edu? --      Excl. in [place]? --     Constitutional: Alert and oriented. Well appearing and in no acute distress. Eyes: Conjunctivae are normal.  Head: Atraumatic.  Papular rash to the occipital scalp consistent with folliculitis. Nose: No congestion/rhinnorhea. Mouth/Throat: Mucous membranes are moist.   Neck: Normal range of motion.  Cardiovascular: Normal rate, regular rhythm. Grossly normal heart sounds.  Good peripheral circulation. Respiratory: Normal respiratory effort.  No retractions. Lungs CTAB. Gastrointestinal: No distention.  Musculoskeletal: Trace bilateral lower extremity  edema.  No significant calf or popliteal tenderness on the left.  Extremities warm and well perfused.  Neurologic:  Normal speech and language. No gross focal neurologic deficits are appreciated.  Skin:  Skin is warm and dry. No rash noted. Psychiatric: Mood and affect are normal. Speech and behavior are normal.  ____________________________________________   LABS (all labs ordered are listed, but only abnormal results are displayed)  Labs Reviewed  COMPREHENSIVE METABOLIC PANEL - Abnormal; Notable for the following components:      Result Value   Glucose, Bld 219 (*)    BUN 25 (*)    Total Bilirubin 1.3 (*)    All other components within normal limits  SARS CORONAVIRUS 2 BY RT PCR (HOSPITAL ORDER, [facility])  CBC WITH DIFFERENTIAL/PLATELET  BRAIN NATRIURETIC PEPTIDE  FIBRIN DERIVATIVES D-DIMER ([facility] ONLY)  TROPONIN I (HIGH SENSITIVITY)   ____________________________________________  EKG  ED ECG REPORT I, Arta Silence, the attending physician, personally viewed and interpreted this ECG.  Date:  01/06/2020 EKG Time: 1000 Rate: 91 Rhythm: normal sinus rhythm QRS Axis: Left axis Intervals: normal ST/T Wave abnormalities: normal Narrative Interpretation: no evidence of acute ischemia  ____________________________________________  RADIOLOGY  CXR: No focal infiltrate or edema US venous LLE: No acute DVT  ____________________________________________   PROCEDURES  Procedure(s) performed: No  Procedures  Critical Care performed: No ____________________________________________   INITIAL IMPRESSION / ASSESSMENT AND PLAN / ED COURSE  Pertinent labs & imaging results that were available during my care of the patient were reviewed by me and considered in my medical decision making (see chart for details).  64 year old male with a history of diabetes, hypertension, and high cholesterol presents with multiple complaints, but primarily cough for over the last 10 days ever since the patient returned from [place] where he had been living for the last few years.  He reports that he is vaccinated for COVID-19.  In addition he reports a rash to the back of his head and some intermittent swelling to the left leg.  On exam, the patient is overall well-appearing.  His vital signs are normal.  He has no respiratory distress or increased work of breathing.  His lungs are clear bilaterally.  The remainder of the physical exam is unremarkable except for trace edema to both lower legs, slightly worse in the left ankle and foot.  I reviewed the past medical records in [place].  Prior to this week, the patient was last seen in 2019 before he moved to [place].  He states he has been off of all of his medications over the last 10 days since he returned to the [place].  Chest x-ray obtained from triage shows no focal infiltrate.  Differential includes acute bronchitis, atypical pneumonia, COVID-19, COPD, or less likely cardiac etiology.  I have a lower suspicion for DVT/PE, however given the recent travel and  the unilateral leg pain and swelling we will obtain a D-dimer and an ultrasound as well as basic and cardiac labs.  I will give prednisone and a trial of albuterol.  ----------------------------------------- 11:33 PM on 01/06/2020 -----------------------------------------  DVT study is negative.  Lab work-up is unremarkable including D-dimer and a Covid swab which is negative.  Overall presentation is consistent with acute bronchitis.  The patient's vital signs are stable, and he is appropriate for discharge home.  He feels comfortable going home.  I counseled him on the results of the work-up via the video interpreter.  I answered all of his questions  and gave him return precautions; he expressed understanding.  Per the recent notes, his PMD would like to see him in person before restarting him on all of his medications, since he has not been here in 2 years.  The patient is requesting a supply of several weeks of the main medications to give him some time until he can get a follow-up appointment.  I strongly encouraged him to make an appointment within the next week if possible, but have prescribed 1 month of Metformin, Hyzaar, and atorvastatin per his old prescriptions to get him started on his medications before he can follow-up. ____________________________________________   FINAL CLINICAL IMPRESSION(S) / ED DIAGNOSES  Final diagnoses:  Acute bronchitis, unspecified organism      NEW MEDICATIONS STARTED DURING THIS VISIT:  New Prescriptions   ALBUTEROL (VENTOLIN HFA) 108 (90 BASE) MCG/ACT INHALER    Inhale 2 puffs into the lungs every 6 (six) hours as needed for wheezing or shortness of breath.   ATORVASTATIN (LIPITOR) 20 MG TABLET    Take 1 tablet (20 mg total) by mouth daily.   GUAIFENESIN 200 MG TABLET    Take 2 tablets (400 mg total) by mouth every 6 (six) hours as needed for cough.   LOSARTAN-HYDROCHLOROTHIAZIDE (HYZAAR) 100-12.5 MG TABLET    Take 1 tablet by mouth daily.    METFORMIN (GLUCOPHAGE) 1000 MG TABLET    Take 1 tablet (1,000 mg total) by mouth 2 (two) times daily with a meal.   MUPIROCIN OINTMENT (BACTROBAN) 2 %    Apply to affected area 3 times daily   PREDNISONE (DELTASONE) 20 MG TABLET    Take 3 tablets (60 mg total) by mouth daily with breakfast for 4 days.     Note:  This document was prepared using Dragon voice recognition software and may include unintentional dictation errors.    Arta Silence, MD 01/06/20 [phone]

## 2020-01-06 NOTE — Telephone Encounter (Signed)
Patient has appointment today 7/15

## 2020-01-06 NOTE — ED Triage Notes (Addendum)
Pt here with c/o pain in chest and worsening cough over the past 10-12 days, traveled here from [place] in the past 12 days, also c/o left foot swelling and pain intermittently for a year now. Pt coughing in triage. Has been covid vaccinated, has never had covid. NAD. [employer] interpretor used, pt speaks arabic.

## 2020-01-08 ENCOUNTER — Other Ambulatory Visit: Payer: Self-pay | Admitting: Family Medicine

## 2020-01-08 DIAGNOSIS — E1140 Type 2 diabetes mellitus with diabetic neuropathy, unspecified: Secondary | ICD-10-CM

## 2020-01-08 DIAGNOSIS — R809 Proteinuria, unspecified: Secondary | ICD-10-CM

## 2020-01-08 DIAGNOSIS — E781 Pure hyperglyceridemia: Secondary | ICD-10-CM

## 2020-01-08 NOTE — Telephone Encounter (Signed)
Requested Prescriptions  Pending Prescriptions Disp Refills  . gabapentin (NEURONTIN) 300 MG capsule [Pharmacy Med Name: GABAPENTIN 300MG    CAP] 60 capsule 0    Sig: Take 1 capsule by mouth twice daily     Neurology: Anticonvulsants - gabapentin Passed - 01/08/2020  3:21 PM      Passed - Valid encounter within last 12 months    Recent Outpatient Visits          2 days ago [facility] Delsa Grana, PA-C   1 year ago Microalbuminuria due to type 2 diabetes mellitus [facility])   [facility] [place], Drue Stager, MD   1 year ago Uncontrolled type 2 diabetes mellitus with hyperglycemia [facility])   [facility] [place], Drue Stager, MD   1 year ago Uncontrolled type 2 diabetes mellitus with hyperglycemia [facility])   [facility] [place], Drue Stager, MD   2 years ago Uncontrolled type 2 diabetes mellitus with hyperglycemia [facility])   [place], Dossie Der Asad A, MD             . JARDIANCE 25 MG TABS tablet [Pharmacy Med Name: Jardiance 25 MG Oral Tablet] 30 tablet 0    Sig: Take 1 tablet by mouth once daily     Endocrinology:  Diabetes - SGLT2 Inhibitors Failed - 01/08/2020  3:21 PM      Failed - LDL in normal range and within 360 days    LDL Cholesterol (Calc)  Date Value Ref Range Status  01/20/2018 81 mg/dL (calc) Final    Comment:    Reference range: <100 . Desirable range <100 mg/dL for primary prevention;   <70 mg/dL for patients with CHD or diabetic patients  with > or = 2 CHD risk factors. Marland Kitchen LDL-C is now calculated using the Martin-Hopkins  calculation, which is a validated novel method providing  better accuracy than the Friedewald equation in the  estimation of LDL-C.  Cresenciano Genre et al. Annamaria Helling. 2952;841(32): 2061-2068  (http://education.[employer].com/faq/FAQ164)          Failed - HBA1C is between 0 and 7.9 and within 180 days    HbA1c, POC (controlled diabetic range)  Date Value  Ref Range Status  01/20/2018 9.6 (A) 0.0 - 7.0 % Final         Passed - Cr in normal range and within 360 days    Creat  Date Value Ref Range Status  01/20/2018 1.12 0.70 - 1.25 mg/dL Final    Comment:    For patients >38 years of age, the reference limit for Creatinine is approximately 13% higher for people identified as African-American. .    Creatinine, Ser  Date Value Ref Range Status  01/06/2020 1.01 0.61 - 1.24 mg/dL Final   Creatinine, Urine  Date Value Ref Range Status  07/09/2017 132 20 - 320 mg/dL Final         Passed - eGFR in normal range and within 360 days    GFR, Est African American  Date Value Ref Range Status  01/20/2018 82 > OR = 60 mL/min/1.32m Final   GFR calc Af Amer  Date Value Ref Range Status  01/06/2020 >60 >60 mL/min Final   GFR, Est Non African American  Date Value Ref Range Status  01/20/2018 71 > OR = 60 mL/min/1.783mFinal   GFR calc non Af Amer  Date Value Ref Range Status  01/06/2020 >60 >60 mL/min Final  Passed - Valid encounter within last 6 months    Recent Outpatient Visits          2 days ago [facility] Delsa Grana, PA-C   1 year ago Microalbuminuria due to type 2 diabetes mellitus [facility])   [facility] Steele Sizer, MD   1 year ago Uncontrolled type 2 diabetes mellitus with hyperglycemia [facility])   [facility] Steele Sizer, MD   1 year ago Uncontrolled type 2 diabetes mellitus with hyperglycemia [facility])   [facility] Steele Sizer, MD   2 years ago Uncontrolled type 2 diabetes mellitus with hyperglycemia [facility])   [place], Dossie Der Asad A, MD             . atorvastatin (LIPITOR) 20 MG tablet [Pharmacy Med Name: Atorvastatin Calcium 20 MG Oral Tablet] 90 tablet 0    Sig: TAKE 1 TABLET BY MOUTH AT BEDTIME     Cardiovascular:  Antilipid - Statins Failed - 01/08/2020  3:21 PM      Failed - Total  Cholesterol in normal range and within 360 days    Cholesterol, Total  Date Value Ref Range Status  03/02/2015 190 100 - 199 mg/dL Final   Cholesterol  Date Value Ref Range Status  01/20/2018 161 <200 mg/dL Final         Failed - LDL in normal range and within 360 days    LDL Cholesterol (Calc)  Date Value Ref Range Status  01/20/2018 81 mg/dL (calc) Final    Comment:    Reference range: <100 . Desirable range <100 mg/dL for primary prevention;   <70 mg/dL for patients with CHD or diabetic patients  with > or = 2 CHD risk factors. Marland Kitchen LDL-C is now calculated using the Martin-Hopkins  calculation, which is a validated novel method providing  better accuracy than the Friedewald equation in the  estimation of LDL-C.  Cresenciano Genre et al. Annamaria Helling. 0998;338(25): 2061-2068  (http://education.[facility].com/faq/FAQ164)          Failed - HDL in normal range and within 360 days    HDL  Date Value Ref Range Status  01/20/2018 35 (L) >40 mg/dL Final  03/02/2015 30 (L) >39 mg/dL Final    Comment:    According to ATP-III Guidelines, HDL-C >59 mg/dL is considered a negative risk factor for CHD.          Failed - Triglycerides in normal range and within 360 days    Triglycerides  Date Value Ref Range Status  01/20/2018 371 (H) <150 mg/dL Final    Comment:    . If a non-fasting specimen was collected, consider repeat triglyceride testing on a fasting specimen if clinically indicated.  Yates Decamp et al. J. of Clin. Lipidol. 0539;7:[id number]. Marland Kitchen          Passed - Patient is not pregnant      Passed - Valid encounter within last 12 months    Recent Outpatient Visits          2 days ago [facility] [place], Kristeen Miss, PA-C   1 year ago Microalbuminuria due to type 2 diabetes mellitus [facility])   [facility] [place], Drue Stager, MD   1 year ago Uncontrolled type 2 diabetes mellitus with hyperglycemia [facility])   [facility]  Steele Sizer, MD   1 year ago Uncontrolled type 2 diabetes mellitus with hyperglycemia ([place])   [place]  [facility] Steele Sizer, MD   2 years ago Uncontrolled type 2 diabetes mellitus with hyperglycemia [facility])   [place] Jamesetta Geralds, MD             . JANUVIA 100 MG tablet [Pharmacy Med Name: Januvia 100 MG Oral Tablet] 30 tablet 0    Sig: Take 1 tablet by mouth once daily     Endocrinology:  Diabetes - DPP-4 Inhibitors Failed - 01/08/2020  3:21 PM      Failed - HBA1C is between 0 and 7.9 and within 180 days    HbA1c, POC (controlled diabetic range)  Date Value Ref Range Status  01/20/2018 9.6 (A) 0.0 - 7.0 % Final         Passed - Cr in normal range and within 360 days    Creat  Date Value Ref Range Status  01/20/2018 1.12 0.70 - 1.25 mg/dL Final    Comment:    For patients >65 years of age, the reference limit for Creatinine is approximately 13% higher for people identified as African-American. .    Creatinine, Ser  Date Value Ref Range Status  01/06/2020 1.01 0.61 - 1.24 mg/dL Final   Creatinine, Urine  Date Value Ref Range Status  07/09/2017 132 20 - 320 mg/dL Final         Passed - Valid encounter within last 6 months    Recent Outpatient Visits          2 days ago [facility] Delsa Grana, PA-C   1 year ago Microalbuminuria due to type 2 diabetes mellitus [facility])   [facility] Steele Sizer, MD   1 year ago Uncontrolled type 2 diabetes mellitus with hyperglycemia [facility])   [facility] Steele Sizer, MD   1 year ago Uncontrolled type 2 diabetes mellitus with hyperglycemia [facility])   [facility] Steele Sizer, MD   2 years ago Uncontrolled type 2 diabetes mellitus with hyperglycemia [facility])   [place], Dossie Der Jamesetta Geralds, MD             . losartan-hydrochlorothiazide (HYZAAR) 100-12.5 MG tablet  [Pharmacy Med Name: Losartan Potassium-HCTZ 100-12.5 MG Oral Tablet] 30 tablet 0    Sig: Take 1 tablet by mouth once daily     Cardiovascular: ARB + Diuretic Combos Failed - 01/08/2020  3:21 PM      Failed - Last BP in normal range    BP Readings from Last 1 Encounters:  01/06/20 (!) 159/71         Passed - K in normal range and within 180 days    Potassium  Date Value Ref Range Status  01/06/2020 4.0 3.5 - 5.1 mmol/L Final  07/08/2014 4.1 3.5 - 5.1 mmol/L Final         Passed - Na in normal range and within 180 days    Sodium  Date Value Ref Range Status  01/06/2020 137 135 - 145 mmol/L Final  03/02/2015 143 134 - 144 mmol/L Final  07/08/2014 139 136 - 145 mmol/L Final         Passed - Cr in normal range and within 180 days    Creat  Date Value Ref Range Status  01/20/2018 1.12 0.70 - 1.25 mg/dL Final    Comment:    For patients >20 years of age, the reference limit for Creatinine is approximately 13% higher for people identified as  African-American. .    Creatinine, Ser  Date Value Ref Range Status  01/06/2020 1.01 0.61 - 1.24 mg/dL Final   Creatinine, Urine  Date Value Ref Range Status  07/09/2017 132 20 - 320 mg/dL Final         Passed - Ca in normal range and within 180 days    Calcium  Date Value Ref Range Status  01/06/2020 9.2 8.9 - 10.3 mg/dL Final   Calcium, Total  Date Value Ref Range Status  07/08/2014 9.2 8.5 - 10.1 mg/dL Final         Passed - Patient is not pregnant      Passed - Valid encounter within last 6 months    Recent Outpatient Visits          2 days ago [facility] Delsa Grana, PA-C   1 year ago Microalbuminuria due to type 2 diabetes mellitus [facility])   [facility] [place], Drue Stager, MD   1 year ago Uncontrolled type 2 diabetes mellitus with hyperglycemia [facility])   [facility] [place], Drue Stager, MD   1 year ago Uncontrolled type 2 diabetes mellitus with  hyperglycemia [facility])   [facility] [place], Drue Stager, MD   2 years ago Uncontrolled type 2 diabetes mellitus with hyperglycemia [facility])   [facility] Roselee Nova, MD

## 2020-01-10 ENCOUNTER — Telehealth: Payer: Self-pay | Admitting: *Deleted

## 2020-01-10 NOTE — Telephone Encounter (Signed)
Attempted to contact pt to complete transitions of care assessment; message states voicemail has not been set up; unable to leave message.  Lenor Coffin, RN, BSN, [place] [facility] [place] [phone]

## 2020-01-17 ENCOUNTER — Other Ambulatory Visit: Payer: Self-pay

## 2020-01-17 ENCOUNTER — Telehealth: Payer: Self-pay | Admitting: Family Medicine

## 2020-01-17 NOTE — Telephone Encounter (Signed)
Called interpreting services @ [phone] and spoke with Arabic Interpreter [employer]bt[employer], ID # [phone] in order to translate the message from the pharmacy in regards to the Leonard being discontinued and the Accu Check Guide is the replacement.  Through the interpreter Mr. Sauls verbalized understanding.  Also informed patient that he needs to have an in office appointment to go over his current medication regimen due not been in the office since 2019.

## 2020-01-17 NOTE — Telephone Encounter (Signed)
I called patient to inform him that he can use whatever type of blood glucose meter that his insurance will cover or whatever the pharmacist suggests. He needs an interpreter. He cannot understand anything that I say.

## 2020-01-17 NOTE — Telephone Encounter (Signed)
Pharmacy called to ask that the nurse call patient to explain that the meter he wants, Accu chek aviva is being phased out and the pharmacy does not have that one.  They are suggesting the Guide me strips.  Please advise and call patient to give his an update at [phone]

## 2020-01-20 ENCOUNTER — Encounter: Payer: Self-pay | Admitting: Family Medicine

## 2020-01-20 ENCOUNTER — Other Ambulatory Visit: Payer: Self-pay

## 2020-01-20 ENCOUNTER — Ambulatory Visit (INDEPENDENT_AMBULATORY_CARE_PROVIDER_SITE_OTHER): Payer: Medicaid Other | Admitting: Family Medicine

## 2020-01-20 VITALS — BP 150/70 | HR 97 | Temp 97.3°F | Resp 16 | Ht 68.0 in | Wt 155.8 lb

## 2020-01-20 DIAGNOSIS — E1129 Type 2 diabetes mellitus with other diabetic kidney complication: Secondary | ICD-10-CM

## 2020-01-20 DIAGNOSIS — E1140 Type 2 diabetes mellitus with diabetic neuropathy, unspecified: Secondary | ICD-10-CM | POA: Diagnosis not present

## 2020-01-20 DIAGNOSIS — K581 Irritable bowel syndrome with constipation: Secondary | ICD-10-CM

## 2020-01-20 DIAGNOSIS — R809 Proteinuria, unspecified: Secondary | ICD-10-CM

## 2020-01-20 DIAGNOSIS — E785 Hyperlipidemia, unspecified: Secondary | ICD-10-CM | POA: Diagnosis not present

## 2020-01-20 DIAGNOSIS — E1159 Type 2 diabetes mellitus with other circulatory complications: Secondary | ICD-10-CM | POA: Diagnosis not present

## 2020-01-20 DIAGNOSIS — I10 Essential (primary) hypertension: Secondary | ICD-10-CM

## 2020-01-20 DIAGNOSIS — Z23 Encounter for immunization: Secondary | ICD-10-CM | POA: Diagnosis not present

## 2020-01-20 LAB — POCT GLYCOSYLATED HEMOGLOBIN (HGB A1C): Hemoglobin A1C: 13.3 % — AB (ref 4.0–5.6)

## 2020-01-20 MED ORDER — GLIPIZIDE ER 5 MG PO TB24: 5.0000 mg | ORAL_TABLET | Freq: Every day | ORAL | 0 refills | Status: DC

## 2020-01-20 MED ORDER — GABAPENTIN 300 MG PO CAPS: 300.0000 mg | ORAL_CAPSULE | Freq: Two times a day (BID) | ORAL | 1 refills | Status: DC

## 2020-01-20 MED ORDER — OMEGA-3-ACID ETHYL ESTERS 1 G PO CAPS: 2.0000 g | ORAL_CAPSULE | Freq: Two times a day (BID) | ORAL | 1 refills | Status: DC

## 2020-01-20 MED ORDER — SITAGLIPTIN PHOSPHATE 100 MG PO TABS: 100.0000 mg | ORAL_TABLET | Freq: Every day | ORAL | 0 refills | Status: DC

## 2020-01-20 MED ORDER — VALSARTAN-HYDROCHLOROTHIAZIDE 160-12.5 MG PO TABS: 1.0000 | ORAL_TABLET | Freq: Every day | ORAL | 0 refills | Status: DC

## 2020-01-20 MED ORDER — EMPAGLIFLOZIN 25 MG PO TABS: 25.0000 mg | ORAL_TABLET | Freq: Every day | ORAL | 0 refills | Status: DC

## 2020-01-20 MED ORDER — METFORMIN HCL 1000 MG PO TABS: 1000.0000 mg | ORAL_TABLET | Freq: Two times a day (BID) | ORAL | 1 refills | Status: DC

## 2020-01-20 MED ORDER — BLOOD GLUCOSE METER KIT: PACK | 2 refills | Status: DC

## 2020-01-20 NOTE — Progress Notes (Signed)
Name: Daniel Powell   MRN: [id number]    DOB: May 04, 1956   Date:01/20/2020       Progress Note  Subjective  Chief Complaint  Chief Complaint  Patient presents with  . Medication Management  . Constipation    HPI  DMII: he was in [place] for 2 years, just got back 2 weeks ago.  He denies side effects. He has not been checking glucose at home. He has dyslipidemia, hypertension and neuropathy. He continues to have foot burning and pain. He was not taking medications daily, he made medication last two years, A1C is over 13, we will resume medications and return in 3 months for follow up, explained importance of compliance with diet also   Abdominal pain: he has seen Dr. Vicente Males back in 2019, he had a poor prep and one polyp, he was due for repeat colonoscopy but lost to follow up. He is having bloating, worsening of constipation. He has been using enema prn. Advised follow up with Dr. Vicente Males   Depression: he spent two years in [place], he came back to live with his brother , doing well now , last time he came in he was feeling very isolated , he returned to [place] a couple of weeks ago   Dyslipidemia: He was in [place] for two years and the cost of medication was very high, he wants to resume medications  Cough: recently went to [facility], no longer taking inhaler, still has intermittent cough, and sob   HTN: bp is elevated, goal is below 135/80, we will change from Losartan hctz to Valsartan hctz 160/12.5, he has noticed some headache , but not chest pain or palpitation   Patient Active Problem List   Diagnosis Date Noted  . Cholelithiasis without cholecystitis 01/24/2018  . Hepatic steatosis 01/24/2018  . Right inguinal hernia 01/24/2018  . Abdominal pain 01/23/2018  . Abdominal distention   . Acute pancreatitis   . Major depression, recurrent ([facility]) 01/20/2018  . HTN (hypertension), benign 04/03/2017  . Hypertriglyceridemia 07/27/2015  . Diabetes mellitus type 2, uncontrolled ([facility]) 03/01/2015  .  Dyslipidemia 03/01/2015  . Back pain, thoracic 03/01/2015  . Persistent headaches 03/01/2015  . Folliculitis 01/75/1025  . Fracture of lumbar spine ([facility]) 11/05/2013    Past Surgical History:  Procedure Laterality Date  . BACK SURGERY  2014  . COLONOSCOPY WITH PROPOFOL N/A 03/11/2018   Procedure: COLONOSCOPY WITH PROPOFOL;  Surgeon: Jonathon Bellows, MD;  Location: [facility] ENDOSCOPY;  Service: Gastroenterology;  Laterality: N/A;  . HERNIA REPAIR      Family History  Problem Relation Age of Onset  . Heart attack Mother   . Diabetes Mother   . Hypertension Mother   . Heart disease Father   . Diabetes Father   . Hypertension Father   . Diabetes Sister   . Hypertension Sister   . Diabetes Son   . Diabetes Son   . Hip dysplasia Daughter     Social History   Tobacco Use  . Smoking status: Never Smoker  . Smokeless tobacco: Never Used  Substance Use Topics  . Alcohol use: No    Alcohol/week: 0.0 standard drinks     Current Outpatient Medications:  .  atorvastatin (LIPITOR) 20 MG tablet, TAKE 1 TABLET BY MOUTH AT BEDTIME, Disp: 90 tablet, Rfl: 0 .  Blood Glucose Monitoring Suppl (ACCU-CHEK AVIVA PLUS) w/Device KIT, 1 kit by Does not apply route daily., Disp: 1 kit, Rfl: 0 .  empagliflozin (JARDIANCE) 25 MG TABS tablet, Take  1 tablet (25 mg total) by mouth daily., Disp: 90 tablet, Rfl: 0 .  gabapentin (NEURONTIN) 300 MG capsule, Take 1 capsule (300 mg total) by mouth 2 (two) times daily., Disp: 180 capsule, Rfl: 1 .  glipiZIDE (GLUCOTROL XL) 5 MG 24 hr tablet, Take 1 tablet (5 mg total) by mouth daily with breakfast. In place of glyburide, Disp: 90 tablet, Rfl: 0 .  glucose blood (ACCU-CHEK AVIVA PLUS) test strip, Use as instructed, Disp: 100 each, Rfl: 12 .  metFORMIN (GLUCOPHAGE) 1000 MG tablet, Take 1 tablet (1,000 mg total) by mouth 2 (two) times daily with a meal., Disp: 180 tablet, Rfl: 1 .  sitaGLIPtin (JANUVIA) 100 MG tablet, Take 1 tablet (100 mg total) by mouth daily., Disp:  90 tablet, Rfl: 0 .  blood glucose meter kit and supplies, Dispense based on patient and insurance preference. Use up to four times daily as directed. (FOR ICD-10 E10.9, E11.9)., Disp: 1 each, Rfl: 2 .  omega-3 acid ethyl esters (LOVAZA) 1 g capsule, Take 2 capsules (2 g total) by mouth 2 (two) times daily., Disp: 360 capsule, Rfl: 1 .  traZODone (DESYREL) 50 MG tablet, Take 0.5-1 tablets (25-50 mg total) by mouth at bedtime as needed for sleep. (Patient not taking: Reported on 01/20/2020), Disp: 30 tablet, Rfl: 1 .  valsartan-hydrochlorothiazide (DIOVAN HCT) 160-12.5 MG tablet, Take 1 tablet by mouth daily., Disp: 90 tablet, Rfl: 0  Allergies  Allergen Reactions  . Gadolinium Derivatives Itching    Pt began itching across ribs and back and neck s/p Multihance injection    I personally reviewed active problem list, medication list, allergies, family history, social history, health maintenance with the patient/caregiver today.   ROS   Constitutional: Negative for fever or weight change.  Respiratory:positive for intermittent cough and shortness of breath.   Cardiovascular: Negative for chest pain or palpitations.  Gastrointestinal: positive  for abdominal pain, no bowel changes.  Musculoskeletal: Negative for gait problem or joint swelling.  Skin: Negative for rash.  Neurological: Negative for dizziness , positive for intermittent headache.  No other specific complaints in a complete review of systems (except as listed in HPI above).   Objective  Vitals:   01/20/20 1148  BP: (!) 150/70  Pulse: 97  Resp: 16  Temp: (!) 97.3 F (36.3 C)  TempSrc: Temporal  SpO2: 99%  Weight: 155 lb 12.8 oz (70.7 kg)  Height: 5' 8"  (1.727 m)    Body mass index is 23.69 kg/m.  Physical Exam  Constitutional: Patient appears well-developed and well-nourished. No distress.  HEENT: head atraumatic, normocephalic, pupils equal and reactive to light, neck supple Cardiovascular: Normal rate,  regular rhythm and normal heart sounds.  No murmur heard. No BLE edema. Pulmonary/Chest: Effort normal and breath sounds normal. No respiratory distress. Abdominal: Soft.  There is no tenderness. Psychiatric: Patient has a normal mood and affect. behavior is normal. Judgment and thought content normal.  Recent Results (from the past 2160 hour(s))  POCT CBG (manual entry)     Status: Abnormal   Collection Time: 01/06/20  1:59 PM  Result Value Ref Range   POCT Glucose ([facility]) 376 (A) 70 - 99 mg/dL    Comment: provider at bedside  Comprehensive metabolic panel     Status: Abnormal   Collection Time: 01/06/20  9:48 PM  Result Value Ref Range   Sodium 137 135 - 145 mmol/L   Potassium 4.0 3.5 - 5.1 mmol/L   Chloride 100 98 - 111 mmol/L  CO2 29 22 - 32 mmol/L   Glucose, Bld 219 (H) 70 - 99 mg/dL    Comment: Glucose reference range applies only to samples taken after fasting for at least 8 hours.   BUN 25 (H) 8 - 23 mg/dL   Creatinine, Ser 1.01 0.61 - 1.24 mg/dL   Calcium 9.2 8.9 - 10.3 mg/dL   Total Protein 7.5 6.5 - 8.1 g/dL   Albumin 4.1 3.5 - 5.0 g/dL   AST 20 15 - 41 U/L   ALT 21 0 - 44 U/L   Alkaline Phosphatase 50 38 - 126 U/L   Total Bilirubin 1.3 (H) 0.3 - 1.2 mg/dL   GFR calc non Af Amer >60 >60 mL/min   GFR calc Af Amer >60 >60 mL/min   Anion gap 8 5 - 15    Comment: Performed at [facility], [address] [place], [place] [address]  CBC with Differential     Status: None   Collection Time: 01/06/20  9:48 PM  Result Value Ref Range   WBC 4.6 4.0 - 10.5 K/uL   RBC 4.85 4.22 - 5.81 MIL/uL   Hemoglobin 14.1 13.0 - 17.0 g/dL   HCT 40.6 39 - 52 %   MCV 83.7 80.0 - 100.0 fL   MCH 29.1 26.0 - 34.0 pg   MCHC 34.7 30.0 - 36.0 g/dL   RDW 13.1 11.5 - 15.5 %   Platelets 184 150 - 400 K/uL   nRBC 0.0 0.0 - 0.2 %   Neutrophils Relative % 54 %   Neutro Abs 2.5 1.7 - 7.7 K/uL   Lymphocytes Relative 29 %   Lymphs Abs 1.4 0.7 - 4.0 K/uL   Monocytes Relative 11 %    Monocytes Absolute 0.5 0 - 1 K/uL   Eosinophils Relative 5 %   Eosinophils Absolute 0.2 0 - 0 K/uL   Basophils Relative 1 %   Basophils Absolute 0.0 0 - 0 K/uL   Immature Granulocytes 0 %   Abs Immature Granulocytes 0.01 0.00 - 0.07 K/uL    Comment: Performed at [facility], [place], [place] [address]  Troponin I (High Sensitivity)     Status: None   Collection Time: 01/06/20  9:48 PM  Result Value Ref Range   Troponin I (High Sensitivity) 4 <18 ng/L    Comment: (NOTE) Elevated high sensitivity troponin I (hsTnI) values and significant  changes across serial measurements may suggest ACS but many other  chronic and acute conditions are known to elevate hsTnI results.  Refer to the "Links" section for chest pain algorithms and additional  guidance. Performed at [facility], [address] [place], [place] [address]   Brain natriuretic peptide     Status: None   Collection Time: 01/06/20  9:48 PM  Result Value Ref Range   B Natriuretic Peptide 22.4 0.0 - 100.0 pg/mL    Comment: Performed at [facility], [address] [place], [place] [address]  Fibrin derivatives D-Dimer     Status: None   Collection Time: 01/06/20  9:48 PM  Result Value Ref Range   Fibrin derivatives D-dimer ([facility]) 429.70 0.00 - 499.00 ng/mL (FEU)    Comment: (NOTE) <> Exclusion of Venous Thromboembolism (VTE) - OUTPATIENT ONLY   (Emergency Department or [place])    0-499 ng/ml (FEU): With a low to intermediate pretest probability  for VTE this test result excludes the diagnosis                      of VTE.   >499 ng/ml (FEU) : VTE not excluded; additional work up for VTE is                      required.  <> Testing on Inpatients and Evaluation of Disseminated Intravascular   Coagulation (DIC) Reference Range:   0-499 ng/ml (FEU) Performed at [facility], [address] [place], [place] [address]   SARS Coronavirus 2 by RT  PCR (hospital order, performed in [facility] hospital lab) Nasopharyngeal Nasopharyngeal Swab     Status: None   Collection Time: 01/06/20  9:48 PM   Specimen: Nasopharyngeal Swab  Result Value Ref Range   SARS Coronavirus 2 NEGATIVE NEGATIVE    Comment: (NOTE) SARS-CoV-2 target nucleic acids are NOT DETECTED.  The SARS-CoV-2 RNA is generally detectable in upper and lower respiratory specimens during the acute phase of infection. The lowest concentration of SARS-CoV-2 viral copies this assay can detect is 250 copies / mL. A negative result does not preclude SARS-CoV-2 infection and should not be used as the sole basis for treatment or other patient management decisions.  A negative result may occur with improper specimen collection / handling, submission of specimen other than nasopharyngeal swab, presence of viral mutation(s) within the areas targeted by this assay, and inadequate number of viral copies (<250 copies / mL). A negative result must be combined with clinical observations, patient history, and epidemiological information.  Fact Sheet for Patients:   StrictlyIdeas.no  Fact Sheet for Healthcare Providers: BankingDealers.co.za  This test is not yet approved or  cleared by the [place] FDA and has been authorized for detection and/or diagnosis of SARS-CoV-2 by FDA under an Emergency Use Authorization (EUA).  This EUA will remain in effect (meaning this test can be used) for the duration of the COVID-19 declaration under Section 564(b)(1) of the Act, 21 U.S.C. section 360bbb-3(b)(1), unless the authorization is terminated or revoked sooner.  Performed at [facility], [place], [place] [address]   POCT glycosylated hemoglobin (Hb A1C)     Status: Abnormal   Collection Time: 01/20/20 12:20 PM  Result Value Ref Range   Hemoglobin A1C 13.3 (A) 4.0 - 5.6 %   HbA1c POC (<> result, manual entry)      HbA1c, POC (prediabetic range)     HbA1c, POC (controlled diabetic range)      Diabetic Foot Exam: Diabetic Foot Exam - Simple   Simple Foot Form Diabetic Foot exam was performed with the following findings: Yes 01/20/2020 12:17 PM  Visual Inspection No deformities, no ulcerations, no other skin breakdown bilaterally: Yes Sensation Testing Intact to touch and monofilament testing bilaterally: Yes Pulse Check Posterior Tibialis and Dorsalis pulse intact bilaterally: Yes Comments      PHQ2/9: Depression screen [facility] 2/9 01/20/2020 01/06/2020 04/17/2018 01/20/2018 07/09/2017  Decreased Interest 0 0 2 0 0  Down, Depressed, Hopeless 0 0 1 2 0  PHQ - 2 Score 0 0 3 2 0  Altered sleeping 0 3 3 3  -  Tired, decreased energy 0 0 2 3 -  Change in appetite 0 0 1 0 -  Feeling bad or failure about yourself  0 0 1 0 -  Trouble concentrating 0 3 3 1  -  Moving slowly or fidgety/restless 0 0 1 1 -  Suicidal thoughts 0 0 0 0 -  PHQ-9 Score 0 6 14 10  -  Difficult doing work/chores - Somewhat difficult Very difficult Not difficult at all -    phq 9 is negative   Fall Risk: Fall Risk  01/20/2020 01/06/2020 02/10/2018 01/20/2018 07/09/2017  Falls in the past year? 0 1 No No No  Number falls in past yr: 0 1 - - -  Injury with Fall? 0 0 - - -  Risk for fall due to : - History of fall(s);Impaired balance/gait - - -      Functional Status Survey: Is the patient deaf or have difficulty hearing?: No Does the patient have difficulty seeing, even when wearing glasses/contacts?: Yes Does the patient have difficulty concentrating, remembering, or making decisions?: No Does the patient have difficulty walking or climbing stairs?: No Does the patient have difficulty dressing or bathing?: No Does the patient have difficulty doing errands alone such as visiting a doctor's office or shopping?: No    Assessment & Plan   1. Dyslipidemia  - Lipid panel - omega-3 acid ethyl esters (LOVAZA) 1 g capsule;  Take 2 capsules (2 g total) by mouth 2 (two) times daily.  Dispense: 360 capsule; Refill: 1  2. Microalbuminuria due to type 2 diabetes mellitus (HCC)  - POCT glycosylated hemoglobin (Hb A1C) - Microalbumin / creatinine urine ratio - sitaGLIPtin (JANUVIA) 100 MG tablet; Take 1 tablet (100 mg total) by mouth daily.  Dispense: 90 tablet; Refill: 0 - empagliflozin (JARDIANCE) 25 MG TABS tablet; Take 1 tablet (25 mg total) by mouth daily.  Dispense: 90 tablet; Refill: 0 - metFORMIN (GLUCOPHAGE) 1000 MG tablet; Take 1 tablet (1,000 mg total) by mouth 2 (two) times daily with a meal.  Dispense: 180 tablet; Refill: 1 - glipiZIDE (GLUCOTROL XL) 5 MG 24 hr tablet; Take 1 tablet (5 mg total) by mouth daily with breakfast. In place of glyburide  Dispense: 90 tablet; Refill: 0 - valsartan-hydrochlorothiazide (DIOVAN HCT) 160-12.5 MG tablet; Take 1 tablet by mouth daily.  Dispense: 90 tablet; Refill: 0  3. Hypertension associated with type 2 diabetes mellitus (HCC)  - POCT glycosylated hemoglobin (Hb A1C) - valsartan-hydrochlorothiazide (DIOVAN HCT) 160-12.5 MG tablet; Take 1 tablet by mouth daily.  Dispense: 90 tablet; Refill: 0  4. Need for Tdap vaccination  - Tdap vaccine greater than or equal to 7yo IM  5. Irritable bowel syndrome with constipation  - Ambulatory referral to Gastroenterology  6. Controlled type 2 diabetes with neuropathy (HCC)  - gabapentin (NEURONTIN) 300 MG capsule; Take 1 capsule (300 mg total) by mouth 2 (two) times daily.  Dispense: 180 capsule; Refill: 1 - glipiZIDE (GLUCOTROL XL) 5 MG 24 hr tablet; Take 1 tablet (5 mg total) by mouth daily with breakfast. In place of glyburide  Dispense: 90 tablet; Refill: 0  He asked for glyburide, explained he had hypoglycemic episodes at night in the past, glipizide is safer

## 2020-01-20 NOTE — Patient Instructions (Signed)
colace

## 2020-01-21 LAB — LIPID PANEL
Cholesterol: 201 mg/dL — ABNORMAL HIGH (ref <200)
HDL: 36 mg/dL — ABNORMAL LOW (ref >=40)
Non-HDL Cholesterol (Calc): 165 mg/dL (calc) — ABNORMAL HIGH (ref <130)
Total CHOL/HDL Ratio: 5.6 (calc) — ABNORMAL HIGH (ref <5.0)
Triglycerides: 536 mg/dL — ABNORMAL HIGH (ref <150)

## 2020-01-21 LAB — MICROALBUMIN / CREATININE URINE RATIO
Creatinine, Urine: 16 mg/dL — ABNORMAL LOW (ref 20–320)
Microalb Creat Ratio: 913 mcg/mg creat — ABNORMAL HIGH (ref <30)
Microalb, Ur: 14.6 mg/dL

## 2020-01-23 DIAGNOSIS — Z419 Encounter for procedure for purposes other than remedying health state, unspecified: Secondary | ICD-10-CM | POA: Diagnosis not present

## 2020-01-25 ENCOUNTER — Ambulatory Visit (INDEPENDENT_AMBULATORY_CARE_PROVIDER_SITE_OTHER): Payer: Medicaid Other

## 2020-01-25 VITALS — BP 102/50 | HR 112

## 2020-01-25 DIAGNOSIS — E1140 Type 2 diabetes mellitus with diabetic neuropathy, unspecified: Secondary | ICD-10-CM | POA: Diagnosis not present

## 2020-01-25 DIAGNOSIS — Z0130 Encounter for examination of blood pressure without abnormal findings: Secondary | ICD-10-CM

## 2020-01-25 LAB — POCT GLUCOSE (DEVICE FOR HOME USE): Glucose Fasting, POC: 292 mg/dL — AB (ref 70–99)

## 2020-01-25 NOTE — Progress Notes (Signed)
Hard for patient to understand. Will explain at his visit tomorrow when the interpreter is with him.

## 2020-01-25 NOTE — Progress Notes (Addendum)
Patient is here for a blood pressure check. Patient denies chest pain, palpitations, shortness of breath or visual disturbances. At previous visit blood pressure was 150/70 with a heart rate of 97. Today during nurse visit first check blood pressure was 102/50 and heart rate was 112.  He take blood pressure medications as prescribed with no missed doses.  Patient left Medical Certification for Disability Exception for [place] Citizenship and Immigration Services forms to be completed. Please call patient and let him know when forms are ready.   He came back in with interpreter. He is concerned about his BS. He said that his bs has been elevated since you changed his medication. It has been in the range of 261-300. At his request I checked it and it was 292. He would like to be changed back to the medication he was on previously. He said he has been on that medication for a long time and his blood sugar was controlled well with it Amaryl.  He would like a phone call to advise.

## 2020-01-25 NOTE — Addendum Note (Signed)
Addended by: Chilton Greathouse on: 01/25/2020 11:53 AM   Modules accepted: Orders

## 2020-01-26 ENCOUNTER — Other Ambulatory Visit: Payer: Self-pay

## 2020-01-26 ENCOUNTER — Ambulatory Visit (INDEPENDENT_AMBULATORY_CARE_PROVIDER_SITE_OTHER): Payer: Medicaid Other | Admitting: Family Medicine

## 2020-01-26 ENCOUNTER — Encounter: Payer: Self-pay | Admitting: Family Medicine

## 2020-01-26 VITALS — BP 160/80 | HR 99 | Temp 97.3°F | Resp 16 | Ht 68.0 in | Wt 152.8 lb

## 2020-01-26 DIAGNOSIS — E1129 Type 2 diabetes mellitus with other diabetic kidney complication: Secondary | ICD-10-CM

## 2020-01-26 DIAGNOSIS — I10 Essential (primary) hypertension: Secondary | ICD-10-CM

## 2020-01-26 DIAGNOSIS — R413 Other amnesia: Secondary | ICD-10-CM

## 2020-01-26 DIAGNOSIS — R809 Proteinuria, unspecified: Secondary | ICD-10-CM

## 2020-01-26 DIAGNOSIS — E1159 Type 2 diabetes mellitus with other circulatory complications: Secondary | ICD-10-CM | POA: Diagnosis not present

## 2020-01-26 DIAGNOSIS — I152 Hypertension secondary to endocrine disorders: Secondary | ICD-10-CM

## 2020-01-26 MED ORDER — VALSARTAN-HYDROCHLOROTHIAZIDE 160-12.5 MG PO TABS: 2.0000 | ORAL_TABLET | Freq: Every day | ORAL | 0 refills | Status: DC

## 2020-01-26 MED ORDER — GLIMEPIRIDE 4 MG PO TABS: 4.0000 mg | ORAL_TABLET | Freq: Every day | ORAL | 0 refills | Status: DC

## 2020-01-26 NOTE — Progress Notes (Signed)
Name: Daniel Powell   MRN: [id number]    DOB: 09/07/1955   Date:01/26/2020       Progress Note  Subjective  Chief Complaint  Chief Complaint  Patient presents with  . Form Completion    Here to have disablitiy forms completed for immigration  . Diabetes    Continues to have concerns about his bs and wants his medication changed back to what it was.    HPI  Patient came in today asking me to fill out a medical certification for disability exceptions. I explained that he does not have any disabilities that may affect the ability to demonstrate an understanding of the [place] language and or a knowledge and understanding of fundamentals of the history and the principles and form of government of the [place]  He only brought up some cognitive dysfunction during his last visit when I had already left the room ( asked CMA ) today with the assistance of the interpreter he did a MMS test but he is able to tell me all about his needs and also request change in medications, travel and care for himself. I do not see any evidence of disability based on his cognitive impairment   DMII: we changed the sulfonylurea to glipizide but he states glucose is still high at home and wants to go back on Amaryl.   HTN: bp is still elevated today , he has been taking Valsartan hctz for the past week, we will double the dose and he will return in one week for CMA./ BP check and if bp is at goal we will change to higher dose of medication    Patient Active Problem List   Diagnosis Date Noted  . Cholelithiasis without cholecystitis 01/24/2018  . Hepatic steatosis 01/24/2018  . Right inguinal hernia 01/24/2018  . Abdominal pain 01/23/2018  . Abdominal distention   . Acute pancreatitis   . Major depression, recurrent ([place]) 01/20/2018  . HTN (hypertension), benign 04/03/2017  . Hypertriglyceridemia 07/27/2015  . Diabetes mellitus type 2, uncontrolled ([place]) 03/01/2015  . Dyslipidemia 03/01/2015  . Back pain,  thoracic 03/01/2015  . Persistent headaches 03/01/2015  . Folliculitis 93/73/4287  . Fracture of lumbar spine ([place]) 11/05/2013    Past Surgical History:  Procedure Laterality Date  . BACK SURGERY  2014  . COLONOSCOPY WITH PROPOFOL N/A 03/11/2018   Procedure: COLONOSCOPY WITH PROPOFOL;  Surgeon: Jonathon Bellows, MD;  Location: [facility]COPY;  Service: Gastroenterology;  Laterality: N/A;  . HERNIA REPAIR      Family History  Problem Relation Age of Onset  . Heart attack Mother   . Diabetes Mother   . Hypertension Mother   . Heart disease Father   . Diabetes Father   . Hypertension Father   . Diabetes Sister   . Hypertension Sister   . Diabetes Son   . Diabetes Son   . Hip dysplasia Daughter     Social History   Tobacco Use  . Smoking status: Never Smoker  . Smokeless tobacco: Never Used  Substance Use Topics  . Alcohol use: No    Alcohol/week: 0.0 standard drinks     Current Outpatient Medications:  .  Accu-Chek Softclix Lancets lancets, SMARTSIG:Topical 1 to 4 Times Daily, Disp: , Rfl:  .  atorvastatin (LIPITOR) 20 MG tablet, TAKE 1 TABLET BY MOUTH AT BEDTIME, Disp: 90 tablet, Rfl: 0 .  blood glucose meter kit and supplies, Dispense based on patient and insurance preference. Use up to four times daily  as directed. (FOR ICD-10 E10.9, E11.9)., Disp: 1 each, Rfl: 2 .  Blood Glucose Monitoring Suppl (ACCU-CHEK AVIVA PLUS) w/Device KIT, 1 kit by Does not apply route daily., Disp: 1 kit, Rfl: 0 .  empagliflozin (JARDIANCE) 25 MG TABS tablet, Take 1 tablet (25 mg total) by mouth daily., Disp: 90 tablet, Rfl: 0 .  gabapentin (NEURONTIN) 300 MG capsule, Take 1 capsule (300 mg total) by mouth 2 (two) times daily., Disp: 180 capsule, Rfl: 1 .  glipiZIDE (GLUCOTROL XL) 5 MG 24 hr tablet, Take 1 tablet (5 mg total) by mouth daily with breakfast. In place of glyburide, Disp: 90 tablet, Rfl: 0 .  glucose blood (ACCU-CHEK AVIVA PLUS) test strip, Use as instructed, Disp: 100 each, Rfl:  12 .  metFORMIN (GLUCOPHAGE) 1000 MG tablet, Take 1 tablet (1,000 mg total) by mouth 2 (two) times daily with a meal., Disp: 180 tablet, Rfl: 1 .  omega-3 acid ethyl esters (LOVAZA) 1 g capsule, Take 2 capsules (2 g total) by mouth 2 (two) times daily., Disp: 360 capsule, Rfl: 1 .  sitaGLIPtin (JANUVIA) 100 MG tablet, Take 1 tablet (100 mg total) by mouth daily., Disp: 90 tablet, Rfl: 0 .  traZODone (DESYREL) 50 MG tablet, Take 0.5-1 tablets (25-50 mg total) by mouth at bedtime as needed for sleep., Disp: 30 tablet, Rfl: 1 .  valsartan-hydrochlorothiazide (DIOVAN HCT) 160-12.5 MG tablet, Take 1 tablet by mouth daily., Disp: 90 tablet, Rfl: 0  Allergies  Allergen Reactions  . Gadolinium Derivatives Itching    Pt began itching across ribs and back and neck s/p Multihance injection    I personally reviewed active problem list, medication list, allergies, family history, social history with the patient/caregiver today.   ROS  Ten systems reviewed and is negative except as mentioned in HPI   Objective  Vitals:   01/26/20 1541  BP: (!) 160/80  Pulse: 99  Resp: 16  Temp: (!) 97.3 F (36.3 C)  TempSrc: Temporal  SpO2: 99%  Weight: 152 lb 12.8 oz (69.3 kg)  Height: _0  (1.727 m)    Body mass index is 23.23 kg/m.  Physical Exam  Constitutional: Patient appears well-developed and well-nourished.  No distress.  HEENT: head atraumatic, normocephalic, pupils equal and reactive to light,  neck supple Cardiovascular: Normal rate, regular rhythm and normal heart sounds.  No murmur heard. No BLE edema. Pulmonary/Chest: Effort normal and breath sounds normal. No respiratory distress. Abdominal: Soft.  There is no tenderness. Psychiatric: Patient has a normal mood and affect. behavior is normal. Judgment and thought content normal.  Recent Results (from the past 2160 hour(s))  POCT CBG (manual entry)     Status: Abnormal   Collection Time: 01/06/20  1:59 PM  Result Value Ref Range    POCT Glucose ([facility]) 376 (A) 70 - 99 mg/dL    Comment: provider at bedside  Comprehensive metabolic panel     Status: Abnormal   Collection Time: 01/06/20  9:48 PM  Result Value Ref Range   Sodium 137 135 - 145 mmol/L   Potassium 4.0 3.5 - 5.1 mmol/L   Chloride 100 98 - 111 mmol/L   CO2 29 22 - 32 mmol/L   Glucose, Bld 219 (H) 70 - 99 mg/dL    Comment: Glucose reference range applies only to samples taken after fasting for at least 8 hours.   BUN 25 (H) 8 - 23 mg/dL   Creatinine, Ser 1.01 0.61 - 1.24 mg/dL   Calcium 9.2 8.9 - 10.3  mg/dL   Total Protein 7.5 6.5 - 8.1 g/dL   Albumin 4.1 3.5 - 5.0 g/dL   AST 20 15 - 41 U/L   ALT 21 0 - 44 U/L   Alkaline Phosphatase 50 38 - 126 U/L   Total Bilirubin 1.3 (H) 0.3 - 1.2 mg/dL   GFR calc non Af Amer >60 >60 mL/min   GFR calc Af Amer >60 >60 mL/min   Anion gap 8 5 - 15    Comment: Performed at [facility], [address] [place], [place] [address]  CBC with Differential     Status: None   Collection Time: 01/06/20  9:48 PM  Result Value Ref Range   WBC 4.6 4.0 - 10.5 K/uL   RBC 4.85 4.22 - 5.81 MIL/uL   Hemoglobin 14.1 13.0 - 17.0 g/dL   HCT 40.6 39 - 52 %   MCV 83.7 80.0 - 100.0 fL   MCH 29.1 26.0 - 34.0 pg   MCHC 34.7 30.0 - 36.0 g/dL   RDW 13.1 11.5 - 15.5 %   Platelets 184 150 - 400 K/uL   nRBC 0.0 0.0 - 0.2 %   Neutrophils Relative % 54 %   Neutro Abs 2.5 1.7 - 7.7 K/uL   Lymphocytes Relative 29 %   Lymphs Abs 1.4 0.7 - 4.0 K/uL   Monocytes Relative 11 %   Monocytes Absolute 0.5 0 - 1 K/uL   Eosinophils Relative 5 %   Eosinophils Absolute 0.2 0 - 0 K/uL   Basophils Relative 1 %   Basophils Absolute 0.0 0 - 0 K/uL   Immature Granulocytes 0 %   Abs Immature Granulocytes 0.01 0.00 - 0.07 K/uL    Comment: Performed at [facility], [place], [place] [address]  Troponin I (High Sensitivity)     Status: None   Collection Time: 01/06/20  9:48 PM  Result Value Ref Range   Troponin I (High  Sensitivity) 4 <18 ng/L    Comment: (NOTE) Elevated high sensitivity troponin I (hsTnI) values and significant  changes across serial measurements may suggest ACS but many other  chronic and acute conditions are known to elevate hsTnI results.  Refer to the "Links" section for chest pain algorithms and additional  guidance. Performed at [facility], [address] [place], [place] [address]   Brain natriuretic peptide     Status: None   Collection Time: 01/06/20  9:48 PM  Result Value Ref Range   B Natriuretic Peptide 22.4 0.0 - 100.0 pg/mL    Comment: Performed at [facility], [address] [place], [place] [address]  Fibrin derivatives D-Dimer     Status: None   Collection Time: 01/06/20  9:48 PM  Result Value Ref Range   Fibrin derivatives D-dimer ([facility]) 429.70 0.00 - 499.00 ng/mL (FEU)    Comment: (NOTE) <> Exclusion of Venous Thromboembolism (VTE) - OUTPATIENT ONLY   (Emergency Department or [place])    0-499 ng/ml (FEU): With a low to intermediate pretest probability                      for VTE this test result excludes the diagnosis                      of VTE.   >499 ng/ml (FEU) : VTE not excluded; additional work up for VTE is  required.  <> Testing on Inpatients and Evaluation of Disseminated Intravascular   Coagulation (DIC) Reference Range:   0-499 ng/ml (FEU) Performed at [facility], [address] [place], [place] [address]   SARS Coronavirus 2 by RT PCR (hospital order, performed in [facility] hospital lab) Nasopharyngeal Nasopharyngeal Swab     Status: None   Collection Time: 01/06/20  9:48 PM   Specimen: Nasopharyngeal Swab  Result Value Ref Range   SARS Coronavirus 2 NEGATIVE NEGATIVE    Comment: (NOTE) SARS-CoV-2 target nucleic acids are NOT DETECTED.  The SARS-CoV-2 RNA is generally detectable in upper and lower respiratory specimens during the acute phase of infection. The lowest concentration  of SARS-CoV-2 viral copies this assay can detect is 250 copies / mL. A negative result does not preclude SARS-CoV-2 infection and should not be used as the sole basis for treatment or other patient management decisions.  A negative result may occur with improper specimen collection / handling, submission of specimen other than nasopharyngeal swab, presence of viral mutation(s) within the areas targeted by this assay, and inadequate number of viral copies (<250 copies / mL). A negative result must be combined with clinical observations, patient history, and epidemiological information.  Fact Sheet for Patients:   StrictlyIdeas.no  Fact Sheet for Healthcare Providers: BankingDealers.co.za  This test is not yet approved or  cleared by the [place] FDA and has been authorized for detection and/or diagnosis of SARS-CoV-2 by FDA under an Emergency Use Authorization (EUA).  This EUA will remain in effect (meaning this test can be used) for the duration of the COVID-19 declaration under Section 564(b)(1) of the Act, 21 U.S.C. section 360bbb-3(b)(1), unless the authorization is terminated or revoked sooner.  Performed at [facility], [address] [place], [place] [address]   POCT glycosylated hemoglobin (Hb A1C)     Status: Abnormal   Collection Time: 01/20/20 12:20 PM  Result Value Ref Range   Hemoglobin A1C 13.3 (A) 4.0 - 5.6 %   HbA1c POC (<> result, manual entry)     HbA1c, POC (prediabetic range)     HbA1c, POC (controlled diabetic range)    Lipid panel     Status: Abnormal   Collection Time: 01/20/20 12:34 PM  Result Value Ref Range   Cholesterol 201 (H) <200 mg/dL   HDL 36 (L) > OR = 40 mg/dL   Triglycerides 536 (H) <150 mg/dL    Comment: . If a non-fasting specimen was collected, consider repeat triglyceride testing on a fasting specimen if clinically indicated.  Yates Decamp et al. J. of Clin. Lipidol.  1093;2:355-732. . . There is increased risk of pancreatitis when the  triglyceride concentration is very high  (> or = 500 mg/dL, especially if > or = 1000 mg/dL).  Yates Decamp et al. J. of Clin. Lipidol. 2025;4:270-623. Marland Kitchen    LDL Cholesterol (Calc)  mg/dL (calc)    Comment: . LDL cholesterol not calculated. Triglyceride levels greater than 400 mg/dL invalidate calculated LDL results. . Reference range: <100 . Desirable range <100 mg/dL for primary prevention;   <70 mg/dL for patients with CHD or diabetic patients  with > or = 2 CHD risk factors. Mar[place] Kitchen LDL-C is now calculated using the Martin-Hopkins  calculation, which is a validated novel method providing  better accuracy than the Friedewald equation in the  estimation of LDL-C.  Cresenciano Genre et al. Annamaria Helling. 7628;315(17): 2061-2068  (http://education.[facility].com/faq/FAQ164)    Total CHOL/HDL Ratio 5.6 (H) <5.0 (calc)   Non-HDL  Cholesterol (Calc) 165 (H) <130 mg/dL (calc)    Comment: For patients with diabetes plus 1 major ASCVD risk  factor, treating to a non-HDL-C goal of <100 mg/dL  (LDL-C of <70 mg/dL) is considered a therapeutic  option.   Microalbumin / creatinine urine ratio     Status: Abnormal   Collection Time: 01/20/20 12:34 PM  Result Value Ref Range   Creatinine, Urine 16 (L) 20 - 320 mg/dL   Microalb, Ur 14.6 mg/dL    Comment: Reference Range Not established    Microalb Creat Ratio 913 (H) <30 mcg/mg creat    Comment: . The ADA defines abnormalities in albumin excretion as follows: Mar[place] Kitchen Category         Result (mcg/mg creatinine) . Normal                    <30 Microalbuminuria         30-299  Clinical albuminuria   > OR = 300 . The ADA recommends that at least two of three specimens collected within a 3-6 month period be abnormal before considering a patient to be within a diagnostic category.   POCT Glucose (Device for Home Use)     Status: Abnormal   Collection Time: 01/25/20 11:33 AM  Result  Value Ref Range   Glucose Fasting, POC 292 (A) 70 - 99 mg/dL   POC Glucose        PHQ2/9: Depression screen [facility] 2/9 01/26/2020 01/20/2020 01/06/2020 04/17/2018 01/20/2018  Decreased Interest 0 0 0 2 0  Down, Depressed, Hopeless 0 0 0 1 2  PHQ - 2 Score 0 0 0 3 2  Altered sleeping 0 0 _0 Tired, decreased energy 0 0 0 2 3  Change in appetite 0 0 0 1 0  Feeling bad or failure about yourself  0 0 0 1 0  Trouble concentrating 0 0 _1 Moving slowly or fidgety/restless 0 0 0 1 1  Suicidal thoughts 0 0 0 0 0  PHQ-9 Score 0 0 _2 Difficult doing work/chores - - Somewhat difficult Very difficult Not difficult at all    phq 9 is negative   Fall Risk: Fall Risk  01/26/2020 01/20/2020 01/06/2020 02/10/2018 01/20/2018  Falls in the past year? 0 0 1 No No  Number falls in past yr: 0 0 1 - -  Injury with Fall? 0 0 0 - -  Risk for fall due to : - - History of fall(s);Impaired balance/gait - -     Functional Status Survey: Is the patient deaf or have difficulty hearing?: No Does the patient have difficulty seeing, even when wearing glasses/contacts?: No Does the patient have difficulty concentrating, remembering, or making decisions?: No Does the patient have difficulty walking or climbing stairs?: No Does the patient have difficulty dressing or bathing?: No Does the patient have difficulty doing errands alone such as visiting a doctor's office or shopping?: No   Assessment & Plan  1. Microalbuminuria due to type 2 diabetes mellitus (HCC)  - glimepiride (AMARYL) 4 MG tablet; Take 1 tablet (4 mg total) by mouth daily before breakfast.  Dispense: 90 tablet; Refill: 0 - valsartan-hydrochlorothiazide (DIOVAN HCT) 160-12.5 MG tablet; Take 2 tablets by mouth daily. Until out of prescription , return for bp check in one week and if needed we will change dose to 320/25  Dispense: 30 tablet; Refill: 0  2. Hypertension associated with type 2 diabetes mellitus ([place])  -  valsartan-hydrochlorothiazide (DIOVAN HCT) 160-12.5 MG tablet; Take 2 tablets by mouth daily. Until out of prescription , return for bp check in one week and if needed we will change dose to 320/25  Dispense: 30 tablet; Refill: 0  3. Memory change  We tried to do a MMS , very difficulty to do based on language barriers. She has seen Dr. Shea Evans, back in 2018 and has incomplete evaluation of cognitive impairment due to language barriers, I explained I can refer him to a neurologist for evaluation or send him back to Dr. Shea Evans and let them fill out the forms for him. Explained why I don't feel comfortable filling it out for this reason, also explained uncontrolled diabetes may cause memory changes and needs to get diabetes under control but not a disability at this time  Referral neurologist

## 2020-01-28 ENCOUNTER — Other Ambulatory Visit: Payer: Self-pay

## 2020-01-28 ENCOUNTER — Ambulatory Visit (INDEPENDENT_AMBULATORY_CARE_PROVIDER_SITE_OTHER): Payer: Medicaid Other

## 2020-01-28 VITALS — BP 128/62 | HR 91

## 2020-01-28 DIAGNOSIS — I10 Essential (primary) hypertension: Secondary | ICD-10-CM

## 2020-01-28 DIAGNOSIS — G4700 Insomnia, unspecified: Secondary | ICD-10-CM

## 2020-01-28 DIAGNOSIS — Z0130 Encounter for examination of blood pressure without abnormal findings: Secondary | ICD-10-CM

## 2020-01-28 DIAGNOSIS — I152 Hypertension secondary to endocrine disorders: Secondary | ICD-10-CM

## 2020-01-28 DIAGNOSIS — E1159 Type 2 diabetes mellitus with other circulatory complications: Secondary | ICD-10-CM

## 2020-01-28 DIAGNOSIS — E1129 Type 2 diabetes mellitus with other diabetic kidney complication: Secondary | ICD-10-CM

## 2020-01-28 DIAGNOSIS — E781 Pure hyperglyceridemia: Secondary | ICD-10-CM

## 2020-01-28 DIAGNOSIS — F331 Major depressive disorder, recurrent, moderate: Secondary | ICD-10-CM

## 2020-01-28 DIAGNOSIS — R809 Proteinuria, unspecified: Secondary | ICD-10-CM

## 2020-01-28 NOTE — Progress Notes (Signed)
Patient is here for a blood pressure check. Patient denies chest pain, palpitations, shortness of breath or visual disturbances. At previous visit blood pressure was 160/80 with a heart rate of 99.. Today during nurse visit first check blood pressure was 128/62 and heart rate was 91. He does take blood pressure medications as prescribed.  Patient is requesting that he has 6 months of medications he is going to [place] for 6 months and wants to make sure he has all of his medications.

## 2020-01-29 MED ORDER — ATORVASTATIN CALCIUM 20 MG PO TABS: 20.0000 mg | ORAL_TABLET | Freq: Every day | ORAL | 1 refills | Status: DC

## 2020-01-29 MED ORDER — TRAZODONE HCL 50 MG PO TABS: 25.0000 mg | ORAL_TABLET | Freq: Every evening | ORAL | 5 refills | Status: DC | PRN

## 2020-01-29 MED ORDER — GLIMEPIRIDE 4 MG PO TABS: 4.0000 mg | ORAL_TABLET | Freq: Every day | ORAL | 1 refills | Status: DC

## 2020-01-29 MED ORDER — SITAGLIPTIN PHOSPHATE 100 MG PO TABS: 100.0000 mg | ORAL_TABLET | Freq: Every day | ORAL | 1 refills | Status: DC

## 2020-01-29 MED ORDER — EMPAGLIFLOZIN 25 MG PO TABS: 25.0000 mg | ORAL_TABLET | Freq: Every day | ORAL | 1 refills | Status: DC

## 2020-01-29 MED ORDER — VALSARTAN-HYDROCHLOROTHIAZIDE 320-25 MG PO TABS
1.0000 | ORAL_TABLET | Freq: Every day | ORAL | 1 refills | Status: DC
Start: 2020-01-29 — End: 2020-10-12

## 2020-01-31 ENCOUNTER — Telehealth: Payer: Self-pay

## 2020-01-31 NOTE — Telephone Encounter (Signed)
Pharmacy called questioning which dm med pt is to take jardiance or [place]?

## 2020-02-01 NOTE — Telephone Encounter (Signed)
Left detailed vm with pharmacist

## 2020-02-16 ENCOUNTER — Other Ambulatory Visit: Payer: Self-pay

## 2020-02-16 DIAGNOSIS — E1140 Type 2 diabetes mellitus with diabetic neuropathy, unspecified: Secondary | ICD-10-CM

## 2020-02-16 DIAGNOSIS — Z20822 Contact with and (suspected) exposure to covid-19: Secondary | ICD-10-CM | POA: Diagnosis not present

## 2020-02-16 MED ORDER — ACCU-CHEK AVIVA PLUS VI STRP: ORAL_STRIP | 12 refills | Status: DC

## 2020-02-23 DIAGNOSIS — Z419 Encounter for procedure for purposes other than remedying health state, unspecified: Secondary | ICD-10-CM | POA: Diagnosis not present

## 2020-03-24 DIAGNOSIS — Z419 Encounter for procedure for purposes other than remedying health state, unspecified: Secondary | ICD-10-CM | POA: Diagnosis not present

## 2020-04-11 ENCOUNTER — Ambulatory Visit: Payer: Medicaid Other | Admitting: Gastroenterology

## 2020-04-11 NOTE — Progress Notes (Deleted)
Primary Care Physician: Steele Sizer, MD  Primary Gastroenterologist:  Dr. Lucilla Lame  No chief complaint on file.   HPI: Daniel Powell is a 64 y.o. male here with a history of constipation.  The patient was seen and in the hospital back in 2019 and follow-up with Dr. Vicente Males after his hospital stay.  The patient then underwent a colonoscopy with a polyp removed without any signs of adenoma.  The patient has been diagnosed with irritable bowel syndrome with constipation.  He was tried on samples of Linzess at his last office appointment.  Past Medical History:  Diagnosis Date  . Diabetes mellitus without complication ([place])   . Hypertension     Current Outpatient Medications  Medication Sig Dispense Refill  . Accu-Chek Softclix Lancets lancets SMARTSIG:Topical 1 to 4 Times Daily    . atorvastatin (LIPITOR) 20 MG tablet Take 1 tablet (20 mg total) by mouth at bedtime. 90 tablet 1  . blood glucose meter kit and supplies Dispense based on patient and insurance preference. Use up to four times daily as directed. (FOR ICD-10 E10.9, E11.9). 1 each 2  . Blood Glucose Monitoring Suppl (ACCU-CHEK AVIVA PLUS) w/Device KIT 1 kit by Does not apply route daily. 1 kit 0  . empagliflozin (JARDIANCE) 25 MG TABS tablet Take 1 tablet (25 mg total) by mouth daily. 90 tablet 1  . gabapentin (NEURONTIN) 300 MG capsule Take 1 capsule (300 mg total) by mouth 2 (two) times daily. 180 capsule 1  . glimepiride (AMARYL) 4 MG tablet Take 1 tablet (4 mg total) by mouth daily before breakfast. 90 tablet 1  . glucose blood (ACCU-CHEK AVIVA PLUS) test strip Use as instructed 100 each 12  . metFORMIN (GLUCOPHAGE) 1000 MG tablet Take 1 tablet (1,000 mg total) by mouth 2 (two) times daily with a meal. 180 tablet 1  . omega-3 acid ethyl esters (LOVAZA) 1 g capsule Take 2 capsules (2 g total) by mouth 2 (two) times daily. 360 capsule 1  . sitaGLIPtin (JANUVIA) 100 MG tablet Take 1 tablet (100 mg total) by mouth daily.  90 tablet 1  . traZODone (DESYREL) 50 MG tablet Take 0.5-1 tablets (25-50 mg total) by mouth at bedtime as needed for sleep. 90 tablet 5  . valsartan-hydrochlorothiazide (DIOVAN HCT) 320-25 MG tablet Take 1 tablet by mouth daily. 90 tablet 1   No current facility-administered medications for this visit.    Allergies as of 04/11/2020 - Review Complete 01/26/2020  Allergen Reaction Noted  . Gadolinium derivatives Itching 10/28/2014    ROS:  General: Negative for anorexia, weight loss, fever, chills, fatigue, weakness. ENT: Negative for hoarseness, difficulty swallowing , nasal congestion. CV: Negative for chest pain, angina, palpitations, dyspnea on exertion, peripheral edema.  Respiratory: Negative for dyspnea at rest, dyspnea on exertion, cough, sputum, wheezing.  GI: See history of present illness. GU:  Negative for dysuria, hematuria, urinary incontinence, urinary frequency, nocturnal urination.  Endo: Negative for unusual weight change.    Physical Examination:   There were no vitals taken for this visit.  General: Well-nourished, well-developed in no acute distress.  Eyes: No icterus. Conjunctivae pink. Lungs: Clear to auscultation bilaterally. Non-labored. Heart: Regular rate and rhythm, no murmurs rubs or gallops.  Abdomen: Bowel sounds are normal, nontender, nondistended, no hepatosplenomegaly or masses, no abdominal bruits or hernia , no rebound or guarding.   Extremities: No lower extremity edema. No clubbing or deformities. Neuro: Alert and oriented x 3.  Grossly intact. Skin: Warm and  dry, no jaundice.   Psych: Alert and cooperative, normal mood and affect.  Labs:    Imaging Studies: No results found.  Assessment and Plan:   Daniel Powell is a 64 y.o. y/o male ***     Lucilla Lame, MD. Marval Regal    Note: This dictation was prepared with Dragon dictation along with smaller phrase technology. Any transcriptional errors that result from this process are  unintentional.

## 2020-04-24 ENCOUNTER — Ambulatory Visit: Payer: Medicaid Other | Admitting: Family Medicine

## 2020-04-24 DIAGNOSIS — Z419 Encounter for procedure for purposes other than remedying health state, unspecified: Secondary | ICD-10-CM | POA: Diagnosis not present

## 2020-04-24 NOTE — Progress Notes (Deleted)
Name: Daniel Powell   MRN: [id number]    DOB: 03/22/1956   Date:04/24/2020       Progress Note  Subjective  Chief Complaint  Follow up  HPI Patient came in today asking me to fill out a medical certification for disability exceptions. I explained that he does not have any disabilities that may affect the ability to demonstrate an understanding of the [place] language and or a knowledge and understanding of fundamentals of the history and the principles and form of government of the [place]  He only brought up some cognitive dysfunction during his last visit when I had already left the room ( asked CMA ) today with the assistance of the interpreter he did a MMS test but he is able to tell me all about his needs and also request change in medications, travel and care for himself. I do not see any evidence of disability based on his cognitive impairment   DMII: we changed the sulfonylurea to glipizide but he states glucose is still high at home and wants to go back on Amaryl.   HTN: bp is still elevated today , he has been taking Valsartan hctz for the past week, we will double the dose and he will return in one week for CMA./ BP check and if bp is at goal we will change to higher dose of medication  *** Patient Active Problem List   Diagnosis Date Noted  . Cholelithiasis without cholecystitis 01/24/2018  . Hepatic steatosis 01/24/2018  . Right inguinal hernia 01/24/2018  . Abdominal pain 01/23/2018  . Abdominal distention   . Acute pancreatitis   . Major depression, recurrent ([place]) 01/20/2018  . HTN (hypertension), benign 04/03/2017  . Hypertriglyceridemia 07/27/2015  . Diabetes mellitus type 2, uncontrolled ([place]) 03/01/2015  . Dyslipidemia 03/01/2015  . Back pain, thoracic 03/01/2015  . Persistent headaches 03/01/2015  . Folliculitis 50/35/4656  . Fracture of lumbar spine ([place]) 11/05/2013    Past Surgical History:  Procedure Laterality Date  . BACK SURGERY  2014  .  COLONOSCOPY WITH PROPOFOL N/A 03/11/2018   Procedure: COLONOSCOPY WITH PROPOFOL;  Surgeon: Jonathon Bellows, MD;  Location: [facility] ENDOSCOPY;  Service: Gastroenterology;  Laterality: N/A;  . HERNIA REPAIR      Family History  Problem Relation Age of Onset  . Heart attack Mother   . Diabetes Mother   . Hypertension Mother   . Heart disease Father   . Diabetes Father   . Hypertension Father   . Diabetes Sister   . Hypertension Sister   . Diabetes Son   . Diabetes Son   . Hip dysplasia Daughter     Social History   Tobacco Use  . Smoking status: Never Smoker  . Smokeless tobacco: Never Used  Substance Use Topics  . Alcohol use: No    Alcohol/week: 0.0 standard drinks     Current Outpatient Medications:  .  Accu-Chek Softclix Lancets lancets, SMARTSIG:Topical 1 to 4 Times Daily, Disp: , Rfl:  .  atorvastatin (LIPITOR) 20 MG tablet, Take 1 tablet (20 mg total) by mouth at bedtime., Disp: 90 tablet, Rfl: 1 .  blood glucose meter kit and supplies, Dispense based on patient and insurance preference. Use up to four times daily as directed. (FOR ICD-10 E10.9, E11.9)., Disp: 1 each, Rfl: 2 .  Blood Glucose Monitoring Suppl (ACCU-CHEK AVIVA PLUS) w/Device KIT, 1 kit by Does not apply route daily., Disp: 1 kit, Rfl: 0 .  empagliflozin (JARDIANCE) 25 MG TABS tablet,  Take 1 tablet (25 mg total) by mouth daily., Disp: 90 tablet, Rfl: 1 .  gabapentin (NEURONTIN) 300 MG capsule, Take 1 capsule (300 mg total) by mouth 2 (two) times daily., Disp: 180 capsule, Rfl: 1 .  glimepiride (AMARYL) 4 MG tablet, Take 1 tablet (4 mg total) by mouth daily before breakfast., Disp: 90 tablet, Rfl: 1 .  glucose blood (ACCU-CHEK AVIVA PLUS) test strip, Use as instructed, Disp: 100 each, Rfl: 12 .  metFORMIN (GLUCOPHAGE) 1000 MG tablet, Take 1 tablet (1,000 mg total) by mouth 2 (two) times daily with a meal., Disp: 180 tablet, Rfl: 1 .  omega-3 acid ethyl esters (LOVAZA) 1 g capsule, Take 2 capsules (2 g total) by mouth  2 (two) times daily., Disp: 360 capsule, Rfl: 1 .  sitaGLIPtin (JANUVIA) 100 MG tablet, Take 1 tablet (100 mg total) by mouth daily., Disp: 90 tablet, Rfl: 1 .  traZODone (DESYREL) 50 MG tablet, Take 0.5-1 tablets (25-50 mg total) by mouth at bedtime as needed for sleep., Disp: 90 tablet, Rfl: 5 .  valsartan-hydrochlorothiazide (DIOVAN HCT) 320-25 MG tablet, Take 1 tablet by mouth daily., Disp: 90 tablet, Rfl: 1  Allergies  Allergen Reactions  . Gadolinium Derivatives Itching    Pt began itching across ribs and back and neck s/p Multihance injection    I personally reviewed {Reviewed:14835} with the patient/caregiver today.   ROS  ***  Objective  There were no vitals filed for this visit.  There is no height or weight on file to calculate BMI.  Physical Exam ***  No results found for this or any previous visit (from the past 2160 hour(s)).  Diabetic Foot Exam: Diabetic Foot Exam - Simple   No data filed     ***  PHQ2/9: Depression screen [facility] 2/9 01/26/2020 01/20/2020 01/06/2020 04/17/2018 01/20/2018  Decreased Interest 0 0 0 2 0  Down, Depressed, Hopeless 0 0 0 1 2  PHQ - 2 Score 0 0 0 3 2  Altered sleeping 0 0 _0 Tired, decreased energy 0 0 0 2 3  Change in appetite 0 0 0 1 0  Feeling bad or failure about yourself  0 0 0 1 0  Trouble concentrating 0 0 _1 Moving slowly or fidgety/restless 0 0 0 1 1  Suicidal thoughts 0 0 0 0 0  PHQ-9 Score 0 0 _2 Difficult doing work/chores - - Somewhat difficult Very difficult Not difficult at all    phq 9 is {gen pos QQV:956387} ***  Fall Risk: Fall Risk  01/26/2020 01/20/2020 01/06/2020 02/10/2018 01/20/2018  Falls in the past year? 0 0 1 No No  Number falls in past yr: 0 0 1 - -  Injury with Fall? 0 0 0 - -  Risk for fall due to : - - History of fall(s);Impaired balance/gait - -   ***   Functional Status Survey:   ***   Assessment & Plan  *** There are no diagnoses linked to this encounter.

## 2020-05-24 DIAGNOSIS — Z419 Encounter for procedure for purposes other than remedying health state, unspecified: Secondary | ICD-10-CM | POA: Diagnosis not present

## 2020-06-24 DIAGNOSIS — Z419 Encounter for procedure for purposes other than remedying health state, unspecified: Secondary | ICD-10-CM | POA: Diagnosis not present

## 2020-07-25 DIAGNOSIS — Z419 Encounter for procedure for purposes other than remedying health state, unspecified: Secondary | ICD-10-CM | POA: Diagnosis not present

## 2020-08-22 DIAGNOSIS — Z419 Encounter for procedure for purposes other than remedying health state, unspecified: Secondary | ICD-10-CM | POA: Diagnosis not present

## 2020-08-25 ENCOUNTER — Other Ambulatory Visit: Payer: Self-pay | Admitting: Family Medicine

## 2020-08-25 DIAGNOSIS — R809 Proteinuria, unspecified: Secondary | ICD-10-CM

## 2020-08-25 DIAGNOSIS — E1140 Type 2 diabetes mellitus with diabetic neuropathy, unspecified: Secondary | ICD-10-CM

## 2020-08-25 DIAGNOSIS — E1129 Type 2 diabetes mellitus with other diabetic kidney complication: Secondary | ICD-10-CM

## 2020-09-22 DIAGNOSIS — Z419 Encounter for procedure for purposes other than remedying health state, unspecified: Secondary | ICD-10-CM | POA: Diagnosis not present

## 2020-10-12 ENCOUNTER — Other Ambulatory Visit: Payer: Self-pay

## 2020-10-12 DIAGNOSIS — R809 Proteinuria, unspecified: Secondary | ICD-10-CM

## 2020-10-12 DIAGNOSIS — E781 Pure hyperglyceridemia: Secondary | ICD-10-CM

## 2020-10-12 DIAGNOSIS — E1140 Type 2 diabetes mellitus with diabetic neuropathy, unspecified: Secondary | ICD-10-CM

## 2020-10-12 DIAGNOSIS — E1129 Type 2 diabetes mellitus with other diabetic kidney complication: Secondary | ICD-10-CM

## 2020-10-12 MED ORDER — GABAPENTIN 300 MG PO CAPS: 300.0000 mg | ORAL_CAPSULE | Freq: Two times a day (BID) | ORAL | 0 refills | Status: DC

## 2020-10-12 MED ORDER — ATORVASTATIN CALCIUM 20 MG PO TABS: 20.0000 mg | ORAL_TABLET | Freq: Every day | ORAL | 0 refills | Status: DC

## 2020-10-12 MED ORDER — METFORMIN HCL 1000 MG PO TABS: 1000.0000 mg | ORAL_TABLET | Freq: Two times a day (BID) | ORAL | 0 refills | Status: DC

## 2020-10-12 MED ORDER — EMPAGLIFLOZIN 25 MG PO TABS: 25.0000 mg | ORAL_TABLET | Freq: Every day | ORAL | 0 refills | Status: DC

## 2020-10-12 MED ORDER — SITAGLIPTIN PHOSPHATE 100 MG PO TABS: 100.0000 mg | ORAL_TABLET | Freq: Every day | ORAL | 0 refills | Status: DC

## 2020-10-12 MED ORDER — GLIMEPIRIDE 4 MG PO TABS: 4.0000 mg | ORAL_TABLET | Freq: Every day | ORAL | 0 refills | Status: DC

## 2020-10-12 MED ORDER — VALSARTAN-HYDROCHLOROTHIAZIDE 320-25 MG PO TABS: 1.0000 | ORAL_TABLET | Freq: Every day | ORAL | 0 refills | Status: DC

## 2020-10-18 NOTE — Progress Notes (Deleted)
Name: Daniel Powell   MRN: [id number]    DOB: 11/25/1955   Date:10/18/2020       Progress Note  Subjective  Chief Complaint  Medication Refill  HPI  DMII: we changed the sulfonylurea to glipizide but he states glucose is still high at home and wants to go back on Amaryl.   HTN: bp is still elevated today , he has been taking Valsartan hctz for the past week, we will double the dose and he will return in one week for CMA./ BP check and if bp is at goal we will change to higher dose of medication    Patient Active Problem List   Diagnosis Date Noted  . Cholelithiasis without cholecystitis 01/24/2018  . Hepatic steatosis 01/24/2018  . Right inguinal hernia 01/24/2018  . Abdominal pain 01/23/2018  . Abdominal distention   . Acute pancreatitis   . Major depression, recurrent ([place]) 01/20/2018  . HTN (hypertension), benign 04/03/2017  . Hypertriglyceridemia 07/27/2015  . Diabetes mellitus type 2, uncontrolled ([place]) 03/01/2015  . Dyslipidemia 03/01/2015  . Back pain, thoracic 03/01/2015  . Persistent headaches 03/01/2015  . Folliculitis 90/21/1155  . Fracture of lumbar spine ([place]) 11/05/2013    Past Surgical History:  Procedure Laterality Date  . BACK SURGERY  2014  . COLONOSCOPY WITH PROPOFOL N/A 03/11/2018   Procedure: COLONOSCOPY WITH PROPOFOL;  Surgeon: Jonathon Bellows, MD;  Location: [facility] ENDOSCOPY;  Service: Gastroenterology;  Laterality: N/A;  . HERNIA REPAIR      Family History  Problem Relation Age of Onset  . Heart attack Mother   . Diabetes Mother   . Hypertension Mother   . Heart disease Father   . Diabetes Father   . Hypertension Father   . Diabetes Sister   . Hypertension Sister   . Diabetes Son   . Diabetes Son   . Hip dysplasia Daughter     Social History   Tobacco Use  . Smoking status: Never Smoker  . Smokeless tobacco: Never Used  Substance Use Topics  . Alcohol use: No    Alcohol/week: 0.0 standard drinks     Current Outpatient Medications:  .   Accu-Chek Softclix Lancets lancets, SMARTSIG:Topical 1 to 4 Times Daily, Disp: , Rfl:  .  atorvastatin (LIPITOR) 20 MG tablet, Take 1 tablet (20 mg total) by mouth at bedtime., Disp: 30 tablet, Rfl: 0 .  blood glucose meter kit and supplies, Dispense based on patient and insurance preference. Use up to four times daily as directed. (FOR ICD-10 E10.9, E11.9)., Disp: 1 each, Rfl: 2 .  Blood Glucose Monitoring Suppl (ACCU-CHEK AVIVA PLUS) w/Device KIT, 1 kit by Does not apply route daily., Disp: 1 kit, Rfl: 0 .  empagliflozin (JARDIANCE) 25 MG TABS tablet, Take 1 tablet (25 mg total) by mouth daily., Disp: 30 tablet, Rfl: 0 .  gabapentin (NEURONTIN) 300 MG capsule, Take 1 capsule (300 mg total) by mouth 2 (two) times daily., Disp: 60 capsule, Rfl: 0 .  glimepiride (AMARYL) 4 MG tablet, Take 1 tablet (4 mg total) by mouth daily before breakfast., Disp: 30 tablet, Rfl: 0 .  glucose blood (ACCU-CHEK AVIVA PLUS) test strip, Use as instructed, Disp: 100 each, Rfl: 12 .  metFORMIN (GLUCOPHAGE) 1000 MG tablet, Take 1 tablet (1,000 mg total) by mouth 2 (two) times daily with a meal., Disp: 60 tablet, Rfl: 0 .  omega-3 acid ethyl esters (LOVAZA) 1 g capsule, Take 2 capsules (2 g total) by mouth 2 (two) times daily., Disp: 360  capsule, Rfl: 1 .  sitaGLIPtin (JANUVIA) 100 MG tablet, Take 1 tablet (100 mg total) by mouth daily., Disp: 30 tablet, Rfl: 0 .  traZODone (DESYREL) 50 MG tablet, Take 0.5-1 tablets (25-50 mg total) by mouth at bedtime as needed for sleep., Disp: 90 tablet, Rfl: 5 .  valsartan-hydrochlorothiazide (DIOVAN HCT) 320-25 MG tablet, Take 1 tablet by mouth daily., Disp: 30 tablet, Rfl: 0  Allergies  Allergen Reactions  . Gadolinium Derivatives Itching    Pt began itching across ribs and back and neck s/p Multihance injection    I personally reviewed {Reviewed:14835} with the patient/caregiver today.   ROS  ***  Objective  There were no vitals filed for this visit.  There is no  height or weight on file to calculate BMI.  Physical Exam ***  No results found for this or any previous visit (from the past 2160 hour(s)).  Diabetic Foot Exam: Diabetic Foot Exam - Simple   No data filed    ***  PHQ2/9: Depression screen [facility] 2/9 01/26/2020 01/20/2020 01/06/2020 04/17/2018 01/20/2018  Decreased Interest 0 0 0 2 0  Down, Depressed, Hopeless 0 0 0 1 2  PHQ - 2 Score 0 0 0 3 2  Altered sleeping 0 0 3 3 3   Tired, decreased energy 0 0 0 2 3  Change in appetite 0 0 0 1 0  Feeling bad or failure about yourself  0 0 0 1 0  Trouble concentrating 0 0 3 3 1   Moving slowly or fidgety/restless 0 0 0 1 1  Suicidal thoughts 0 0 0 0 0  PHQ-9 Score 0 0 6 14 10   Difficult doing work/chores - - Somewhat difficult Very difficult Not difficult at all    phq 9 is {gen pos HUO:372902} ***  Fall Risk: Fall Risk  01/26/2020 01/20/2020 01/06/2020 02/10/2018 01/20/2018  Falls in the past year? 0 0 1 No No  Number falls in past yr: 0 0 1 - -  Injury with Fall? 0 0 0 - -  Risk for fall due to : - - History of fall(s);Impaired balance/gait - -   ***   Functional Status Survey:   ***   Assessment & Plan  *** There are no diagnoses linked to this encounter.

## 2020-10-19 ENCOUNTER — Encounter: Payer: Self-pay | Admitting: Family Medicine

## 2020-10-19 ENCOUNTER — Ambulatory Visit: Payer: Medicaid Other | Admitting: Family Medicine

## 2020-10-19 ENCOUNTER — Other Ambulatory Visit: Payer: Self-pay

## 2020-10-19 DIAGNOSIS — R809 Proteinuria, unspecified: Secondary | ICD-10-CM

## 2020-10-20 ENCOUNTER — Ambulatory Visit: Payer: Medicaid Other | Admitting: Family Medicine

## 2020-10-20 ENCOUNTER — Telehealth: Payer: Self-pay

## 2020-10-20 ENCOUNTER — Other Ambulatory Visit: Payer: Self-pay | Admitting: Family Medicine

## 2020-10-20 NOTE — Telephone Encounter (Signed)
Pt has an appt with Kathrine Haddock on 11/07/20 and needs refills on Jardiance and glimepiride to be sent to [facility] [address] . Pt is completely out

## 2020-10-22 DIAGNOSIS — Z419 Encounter for procedure for purposes other than remedying health state, unspecified: Secondary | ICD-10-CM | POA: Diagnosis not present

## 2020-10-23 NOTE — Telephone Encounter (Signed)
lvm to inform pt of refill

## 2020-11-07 ENCOUNTER — Ambulatory Visit: Payer: Medicaid Other | Admitting: Unknown Physician Specialty

## 2020-11-07 ENCOUNTER — Encounter: Payer: Self-pay | Admitting: Unknown Physician Specialty

## 2020-11-07 ENCOUNTER — Other Ambulatory Visit: Payer: Self-pay | Admitting: Family Medicine

## 2020-11-07 ENCOUNTER — Other Ambulatory Visit: Payer: Self-pay

## 2020-11-07 VITALS — BP 120/82 | HR 86 | Temp 98.1°F | Resp 18 | Ht 68.0 in | Wt 158.7 lb

## 2020-11-07 DIAGNOSIS — R809 Proteinuria, unspecified: Secondary | ICD-10-CM | POA: Diagnosis not present

## 2020-11-07 DIAGNOSIS — E1129 Type 2 diabetes mellitus with other diabetic kidney complication: Secondary | ICD-10-CM

## 2020-11-07 DIAGNOSIS — K760 Fatty (change of) liver, not elsewhere classified: Secondary | ICD-10-CM | POA: Diagnosis not present

## 2020-11-07 DIAGNOSIS — E785 Hyperlipidemia, unspecified: Secondary | ICD-10-CM | POA: Diagnosis not present

## 2020-11-07 DIAGNOSIS — E781 Pure hyperglyceridemia: Secondary | ICD-10-CM | POA: Diagnosis not present

## 2020-11-07 DIAGNOSIS — E1151 Type 2 diabetes mellitus with diabetic peripheral angiopathy without gangrene: Secondary | ICD-10-CM | POA: Diagnosis not present

## 2020-11-07 DIAGNOSIS — G629 Polyneuropathy, unspecified: Secondary | ICD-10-CM | POA: Diagnosis not present

## 2020-11-07 DIAGNOSIS — E1140 Type 2 diabetes mellitus with diabetic neuropathy, unspecified: Secondary | ICD-10-CM

## 2020-11-07 DIAGNOSIS — IMO0002 Reserved for concepts with insufficient information to code with codable children: Secondary | ICD-10-CM

## 2020-11-07 DIAGNOSIS — I10 Essential (primary) hypertension: Secondary | ICD-10-CM

## 2020-11-07 DIAGNOSIS — E1165 Type 2 diabetes mellitus with hyperglycemia: Secondary | ICD-10-CM

## 2020-11-07 DIAGNOSIS — Z1211 Encounter for screening for malignant neoplasm of colon: Secondary | ICD-10-CM | POA: Diagnosis not present

## 2020-11-07 DIAGNOSIS — K921 Melena: Secondary | ICD-10-CM | POA: Diagnosis not present

## 2020-11-07 LAB — POCT GLYCOSYLATED HEMOGLOBIN (HGB A1C): Hemoglobin A1C: 10 % — AB (ref 4.0–5.6)

## 2020-11-07 MED ORDER — ATORVASTATIN CALCIUM 40 MG PO TABS: 40.0000 mg | ORAL_TABLET | Freq: Every day | ORAL | 3 refills | Status: DC

## 2020-11-07 MED ORDER — VALSARTAN-HYDROCHLOROTHIAZIDE 320-25 MG PO TABS: 1.0000 | ORAL_TABLET | Freq: Every day | ORAL | 1 refills | Status: DC

## 2020-11-07 MED ORDER — GLIMEPIRIDE 4 MG PO TABS: 4.0000 mg | ORAL_TABLET | Freq: Every day | ORAL | 1 refills | Status: DC

## 2020-11-07 MED ORDER — EMPAGLIFLOZIN 25 MG PO TABS: 25.0000 mg | ORAL_TABLET | Freq: Every day | ORAL | 1 refills | Status: DC

## 2020-11-07 MED ORDER — SITAGLIPTIN PHOSPHATE 100 MG PO TABS: 100.0000 mg | ORAL_TABLET | Freq: Every day | ORAL | 1 refills | Status: DC

## 2020-11-07 MED ORDER — GABAPENTIN 300 MG PO CAPS: 300.0000 mg | ORAL_CAPSULE | Freq: Two times a day (BID) | ORAL | 1 refills | Status: DC

## 2020-11-07 MED ORDER — METFORMIN HCL 1000 MG PO TABS: 1000.0000 mg | ORAL_TABLET | Freq: Two times a day (BID) | ORAL | 1 refills | Status: DC

## 2020-11-07 NOTE — Progress Notes (Signed)
BP 120/82   Pulse 86   Temp 98.1 F (36.7 C) (Oral)   Resp 18   Ht 5\' 8"  (1.727 m)   Wt 158 lb 11.2 oz (72 kg)   SpO2 98%   BMI 24.13 kg/m    Subjective:    Patient ID: Daniel Powell, male    DOB: [birth year], 65 y.o.   MRN: [id number]  HPI: Daniel Powell is a 65 y.o. male  Chief Complaint  Patient presents with  . Diabetes  . Hypertension  . Hyperlipidemia    Follow up, medication refills   Diabetes: Ran out of medications for 2 weeks.   No hypoglycemic episodes No hyperglycemic episodes Feet problems: has neuropathy and taking Gabapentin Blood Sugars averaging: 170-180 with max of 200 eye exam within last year Last Hgb A1C: 13.3  Hypertension  Using medications without difficulty Average home BPs Not checking  Using medication without problems or lightheadedness No chest pain with exertion or shortness of breath Some swelling of lft ankle resolves with elevation.    Elevated Cholesterol Using medications without problems No Muscle aches  Diet: No salty or sweet foods Exercise: not really  Depression screen [facility] 2/9 11/07/2020 11/07/2020 01/26/2020 01/20/2020 01/06/2020  Decreased Interest 0 0 0 0 0  Down, Depressed, Hopeless 0 0 0 0 0  PHQ - 2 Score 0 0 0 0 0  Altered sleeping 0 - 0 0 3  Tired, decreased energy 0 - 0 0 0  Change in appetite 0 - 0 0 0  Feeling bad or failure about yourself  0 - 0 0 0  Trouble concentrating 0 - 0 0 3  Moving slowly or fidgety/restless 0 - 0 0 0  Suicidal thoughts 0 - 0 0 0  PHQ-9 Score 0 - 0 0 6  Difficult doing work/chores - - - - Somewhat difficult  Some recent data might be hidden      Relevant past medical, surgical, family and social history reviewed and updated as indicated. Interim medical history since our last visit reviewed. Allergies and medications reviewed and updated.  Review of Systems  Constitutional: Negative.   Gastrointestinal:       States blood in stool x1. Worries about hemorrhoids   Psychiatric/Behavioral: Negative.     Per HPI unless specifically indicated above     Objective:    BP 120/82   Pulse 86   Temp 98.1 F (36.7 C) (Oral)   Resp 18   Ht 5\' 8"  (1.727 m)   Wt 158 lb 11.2 oz (72 kg)   SpO2 98%   BMI 24.13 kg/m   Wt Readings from Last 3 Encounters:  11/07/20 158 lb 11.2 oz (72 kg)  01/26/20 152 lb 12.8 oz (69.3 kg)  01/20/20 155 lb 12.8 oz (70.7 kg)    Physical Exam Constitutional:      General: He is not in acute distress.    Appearance: Normal appearance. He is well-developed.  HENT:     Head: Normocephalic and atraumatic.  Eyes:     General: Lids are normal. No scleral icterus.       Right eye: No discharge.        Left eye: No discharge.     Conjunctiva/sclera: Conjunctivae normal.  Neck:     Vascular: No carotid bruit or JVD.  Cardiovascular:     Rate and Rhythm: Normal rate and regular rhythm.     Heart sounds: Normal heart sounds.  Pulmonary:  Effort: Pulmonary effort is normal. No respiratory distress.     Breath sounds: Normal breath sounds.  Abdominal:     Palpations: There is no hepatomegaly or splenomegaly.  Musculoskeletal:        General: Normal range of motion.     Cervical back: Normal range of motion and neck supple.  Skin:    General: Skin is warm and dry.     Coloration: Skin is not pale.     Findings: No rash.  Neurological:     Mental Status: He is alert and oriented to person, place, and time.  Psychiatric:        Behavior: Behavior normal.        Thought Content: Thought content normal.        Judgment: Judgment normal.     Results for orders placed or performed in visit on 11/07/20  POCT HgB A1C  Result Value Ref Range   Hemoglobin A1C 10.0 (A) 4.0 - 5.6 %   HbA1c POC (<> result, manual entry)     HbA1c, POC (prediabetic range)     HbA1c, POC (controlled diabetic range)        Assessment & Plan:   Problem List Items Addressed This Visit      Unprioritized   Diabetes mellitus type 2,  uncontrolled ([place])    Hgb A1C is 10.  It seems pt runs out of medications before making his appt. Refill meds and recheck in 2 months.  Increase Amaryl to a max dose of 8 mg      Relevant Medications   glimepiride (AMARYL) 4 MG tablet   empagliflozin (JARDIANCE) 25 MG TABS tablet   atorvastatin (LIPITOR) 40 MG tablet   sitaGLIPtin (JANUVIA) 100 MG tablet   metFORMIN (GLUCOPHAGE) 1000 MG tablet   Dyslipidemia    Check labs today.  Increase Atorvastatin to max dose of 40 mg      Relevant Medications   atorvastatin (LIPITOR) 40 MG tablet   Hepatic steatosis    Check labs today      HTN (hypertension), benign    Stable, continue present medications.        Relevant Medications   atorvastatin (LIPITOR) 40 MG tablet    Other Visit Diagnoses    Microalbuminuria due to type 2 diabetes mellitus (HCC)    -  Primary   Relevant Medications   glimepiride (AMARYL) 4 MG tablet   empagliflozin (JARDIANCE) 25 MG TABS tablet   atorvastatin (LIPITOR) 40 MG tablet   sitaGLIPtin (JANUVIA) 100 MG tablet   metFORMIN (GLUCOPHAGE) 1000 MG tablet   Other Relevant Orders   POCT HgB A1C (Completed)   Encounter for screening colonoscopy       Relevant Orders   Ambulatory referral to Gastroenterology   Blood in stool       Refer to GI.  Pverdie fpr 6 month colonoscopy   Controlled type 2 diabetes with neuropathy (HCC)       Relevant Medications   glimepiride (AMARYL) 4 MG tablet   gabapentin (NEURONTIN) 300 MG capsule   empagliflozin (JARDIANCE) 25 MG TABS tablet   atorvastatin (LIPITOR) 40 MG tablet   sitaGLIPtin (JANUVIA) 100 MG tablet   metFORMIN (GLUCOPHAGE) 1000 MG tablet   Hypertriglyceridemia       Relevant Medications   atorvastatin (LIPITOR) 40 MG tablet       Follow up plan: Return in about 3 months (around 02/07/2021).

## 2020-11-07 NOTE — Telephone Encounter (Signed)
Future visit in 3 months  

## 2020-11-07 NOTE — Assessment & Plan Note (Signed)
Stable, continue present medications.   

## 2020-11-07 NOTE — Assessment & Plan Note (Signed)
Check labs today.  Increase Atorvastatin to max dose of 40 mg

## 2020-11-07 NOTE — Assessment & Plan Note (Signed)
Check labs today.

## 2020-11-07 NOTE — Assessment & Plan Note (Addendum)
Hgb A1C is 10.  It seems pt runs out of medications before making his appt. Refill meds and recheck in 2 months.  Increase Amaryl to a max dose of 8 mg

## 2020-11-08 LAB — COMPREHENSIVE METABOLIC PANEL
AG Ratio: 1.9 (calc) (ref 1.0–2.5)
ALT: 19 U/L (ref 9–46)
AST: 17 U/L (ref 10–35)
Albumin: 4.9 g/dL (ref 3.6–5.1)
Alkaline phosphatase (APISO): 48 U/L (ref 35–144)
BUN/Creatinine Ratio: 24 (calc) — ABNORMAL HIGH (ref 6–22)
BUN: 29 mg/dL — ABNORMAL HIGH (ref 7–25)
CO2: 28 mmol/L (ref 20–32)
Calcium: 9.8 mg/dL (ref 8.6–10.3)
Chloride: 100 mmol/L (ref 98–110)
Creat: 1.2 mg/dL (ref 0.70–1.25)
Globulin: 2.6 g/dL (calc) (ref 1.9–3.7)
Glucose, Bld: 151 mg/dL — ABNORMAL HIGH (ref 65–99)
Potassium: 4.3 mmol/L (ref 3.5–5.3)
Sodium: 138 mmol/L (ref 135–146)
Total Bilirubin: 0.9 mg/dL (ref 0.2–1.2)
Total Protein: 7.5 g/dL (ref 6.1–8.1)

## 2020-11-08 LAB — LIPID PANEL
Cholesterol: 178 mg/dL (ref <200)
HDL: 35 mg/dL — ABNORMAL LOW (ref >=40)
LDL Cholesterol (Calc): 103 mg/dL (calc) — ABNORMAL HIGH
Non-HDL Cholesterol (Calc): 143 mg/dL (calc) — ABNORMAL HIGH (ref <130)
Total CHOL/HDL Ratio: 5.1 (calc) — ABNORMAL HIGH (ref <5.0)
Triglycerides: 302 mg/dL — ABNORMAL HIGH (ref <150)

## 2020-11-22 DIAGNOSIS — Z419 Encounter for procedure for purposes other than remedying health state, unspecified: Secondary | ICD-10-CM | POA: Diagnosis not present

## 2020-12-21 ENCOUNTER — Other Ambulatory Visit: Payer: Self-pay

## 2020-12-21 DIAGNOSIS — M545 Low back pain, unspecified: Secondary | ICD-10-CM

## 2020-12-21 DIAGNOSIS — R413 Other amnesia: Secondary | ICD-10-CM

## 2020-12-21 DIAGNOSIS — IMO0002 Reserved for concepts with insufficient information to code with codable children: Secondary | ICD-10-CM

## 2020-12-21 DIAGNOSIS — E1129 Type 2 diabetes mellitus with other diabetic kidney complication: Secondary | ICD-10-CM

## 2020-12-21 NOTE — Telephone Encounter (Signed)
Please advise patient states he has been taking this med bid? On previous fills I do not see? Is this ok? And can you refill with current dose

## 2020-12-22 DIAGNOSIS — Z419 Encounter for procedure for purposes other than remedying health state, unspecified: Secondary | ICD-10-CM | POA: Diagnosis not present

## 2020-12-26 ENCOUNTER — Telehealth: Payer: Self-pay

## 2020-12-26 NOTE — Telephone Encounter (Signed)
notified

## 2020-12-26 NOTE — Telephone Encounter (Signed)
Copied from [place] [id number]. Topic: General - Other >> Dec 26, 2020  8:21 AM Daniel Powell wrote: Reason for CRM: [place] called in to inquire of Rx sent in for glimepiride (AMARYL) 4 MG tablet say that patient came in to pick up refill but states that he takes it twice Powell day and not once Powell day like the directions read on the Rx. Asking for Powell new Rx or clarification

## 2020-12-29 ENCOUNTER — Other Ambulatory Visit: Payer: Self-pay

## 2020-12-29 DIAGNOSIS — M5416 Radiculopathy, lumbar region: Secondary | ICD-10-CM | POA: Diagnosis not present

## 2020-12-29 DIAGNOSIS — M7062 Trochanteric bursitis, left hip: Secondary | ICD-10-CM | POA: Diagnosis not present

## 2020-12-29 DIAGNOSIS — M7061 Trochanteric bursitis, right hip: Secondary | ICD-10-CM | POA: Diagnosis not present

## 2020-12-29 DIAGNOSIS — G8312 Monoplegia of lower limb affecting left dominant side: Secondary | ICD-10-CM | POA: Diagnosis not present

## 2020-12-29 DIAGNOSIS — E1129 Type 2 diabetes mellitus with other diabetic kidney complication: Secondary | ICD-10-CM

## 2020-12-29 DIAGNOSIS — G8310 Monoplegia of lower limb affecting unspecified side: Secondary | ICD-10-CM | POA: Diagnosis not present

## 2021-01-02 ENCOUNTER — Ambulatory Visit: Payer: Medicaid Other | Admitting: Family Medicine

## 2021-01-09 DIAGNOSIS — R262 Difficulty in walking, not elsewhere classified: Secondary | ICD-10-CM | POA: Diagnosis not present

## 2021-01-09 DIAGNOSIS — M5416 Radiculopathy, lumbar region: Secondary | ICD-10-CM | POA: Diagnosis not present

## 2021-01-09 DIAGNOSIS — Z981 Arthrodesis status: Secondary | ICD-10-CM | POA: Diagnosis not present

## 2021-01-11 ENCOUNTER — Encounter: Payer: Self-pay | Admitting: Family Medicine

## 2021-01-11 ENCOUNTER — Ambulatory Visit: Payer: Medicaid Other | Admitting: Unknown Physician Specialty

## 2021-01-11 DIAGNOSIS — F0390 Unspecified dementia without behavioral disturbance: Secondary | ICD-10-CM | POA: Diagnosis not present

## 2021-01-11 DIAGNOSIS — Z79899 Other long term (current) drug therapy: Secondary | ICD-10-CM | POA: Diagnosis not present

## 2021-01-11 DIAGNOSIS — G3184 Mild cognitive impairment, so stated: Secondary | ICD-10-CM | POA: Diagnosis not present

## 2021-01-18 DIAGNOSIS — M5459 Other low back pain: Secondary | ICD-10-CM | POA: Diagnosis not present

## 2021-01-18 DIAGNOSIS — M25561 Pain in right knee: Secondary | ICD-10-CM | POA: Diagnosis not present

## 2021-01-18 DIAGNOSIS — M25562 Pain in left knee: Secondary | ICD-10-CM | POA: Diagnosis not present

## 2021-01-19 ENCOUNTER — Other Ambulatory Visit (HOSPITAL_COMMUNITY): Payer: Self-pay | Admitting: Neurology

## 2021-01-19 ENCOUNTER — Other Ambulatory Visit: Payer: Self-pay | Admitting: Neurology

## 2021-01-19 DIAGNOSIS — G3184 Mild cognitive impairment, so stated: Secondary | ICD-10-CM

## 2021-01-22 ENCOUNTER — Ambulatory Visit: Payer: Medicaid Other | Admitting: Family Medicine

## 2021-01-27 ENCOUNTER — Encounter (HOSPITAL_COMMUNITY): Payer: Self-pay

## 2021-01-27 ENCOUNTER — Ambulatory Visit (HOSPITAL_COMMUNITY): Admission: RE | Admit: 2021-01-27 | Payer: Medicaid Other | Source: Ambulatory Visit

## 2021-02-06 ENCOUNTER — Ambulatory Visit: Payer: Medicaid Other | Admitting: Family Medicine

## 2021-02-09 DIAGNOSIS — M5416 Radiculopathy, lumbar region: Secondary | ICD-10-CM | POA: Diagnosis not present

## 2021-02-27 ENCOUNTER — Other Ambulatory Visit: Payer: Self-pay | Admitting: Family Medicine

## 2021-02-27 ENCOUNTER — Other Ambulatory Visit: Payer: Self-pay | Admitting: Unknown Physician Specialty

## 2021-02-27 DIAGNOSIS — E1140 Type 2 diabetes mellitus with diabetic neuropathy, unspecified: Secondary | ICD-10-CM

## 2021-02-27 DIAGNOSIS — E1129 Type 2 diabetes mellitus with other diabetic kidney complication: Secondary | ICD-10-CM

## 2021-02-28 ENCOUNTER — Other Ambulatory Visit: Payer: Self-pay

## 2021-02-28 NOTE — Telephone Encounter (Signed)
Requested medications are due for refill today.  yes  Requested medications are on the active medications list.  yes  Last refill. 11/07/2020  Future visit scheduled.   no  Notes to clinic.  PCP is listed as "Provider not in system".

## 2021-02-28 NOTE — Telephone Encounter (Signed)
Pt has been discharged by PCP due to excessive no shows

## 2021-02-28 NOTE — Telephone Encounter (Signed)
Are you refilling

## 2021-02-28 NOTE — Telephone Encounter (Signed)
Requested medication (s) are due for refill today: see note  Requested medication (s) are on the active medication list: yes  Last refill:  11/07/20 #90 1 refill  Future visit scheduled: na  Notes to clinic:  01/11/21 letter out patient will no longer be seen at [facility] . Do you want to continue refills?     Requested Prescriptions  Pending Prescriptions Disp Refills   valsartan-hydrochlorothiazide (DIOVAN-HCT) 320-25 MG tablet [Pharmacy Med Name: Valsartan-hydroCHLOROthiazide 320-25 MG Oral Tablet] 90 tablet 0    Sig: Take 1 tablet by mouth once daily     Cardiovascular: ARB + Diuretic Combos Passed - 02/27/2021  7:15 PM      Passed - K in normal range and within 180 days    Potassium  Date Value Ref Range Status  11/07/2020 4.3 3.5 - 5.3 mmol/L Final  07/08/2014 4.1 3.5 - 5.1 mmol/L Final          Passed - Na in normal range and within 180 days    Sodium  Date Value Ref Range Status  11/07/2020 138 135 - 146 mmol/L Final  03/02/2015 143 134 - 144 mmol/L Final  07/08/2014 139 136 - 145 mmol/L Final          Passed - Cr in normal range and within 180 days    Creat  Date Value Ref Range Status  11/07/2020 1.20 0.70 - 1.25 mg/dL Final    Comment:    For patients >66 years of age, the reference limit for Creatinine is approximately 13% higher for people identified as African-American. .    Creatinine, Urine  Date Value Ref Range Status  01/20/2020 16 (L) 20 - 320 mg/dL Final          Passed - Ca in normal range and within 180 days    Calcium  Date Value Ref Range Status  11/07/2020 9.8 8.6 - 10.3 mg/dL Final   Calcium, Total  Date Value Ref Range Status  07/08/2014 9.2 8.5 - 10.1 mg/dL Final          Passed - Patient is not pregnant      Passed - Last BP in normal range    BP Readings from Last 1 Encounters:  11/07/20 120/82          Passed - Valid encounter within last 6 months    Recent Outpatient Visits           3 months ago  Microalbuminuria due to type 2 diabetes mellitus [facility])   [facility] Kathrine Haddock, NP   1 year ago Blood pressure check   [place], [place]   1 year ago Memory change   [facility] [place], Drue Stager, MD   1 year ago Hypertension associated with type 2 diabetes mellitus [facility])   [facility] Steele Sizer, MD   1 year ago Coughing   [facility] Delsa Grana, PA-C               JARDIANCE 25 MG TABS tablet [Pharmacy Med Name: Jardiance 25 MG Oral Tablet] 90 tablet 0    Sig: Take 1 tablet by mouth once daily     Endocrinology:  Diabetes - SGLT2 Inhibitors Failed - 02/27/2021  7:15 PM      Failed - LDL in normal range and within 360 days    LDL Cholesterol (Calc)  Date Value Ref Range Status  11/07/2020 103 (H) mg/dL (calc)  Final    Comment:    Reference range: <100 . Desirable range <100 mg/dL for primary prevention;   <70 mg/dL for patients with CHD or diabetic patients  with > or = 2 CHD risk factors. Marland Kitchen LDL-C is now calculated using the Martin-Hopkins  calculation, which is a validated novel method providing  better accuracy than the Friedewald equation in the  estimation of LDL-C.  Cresenciano Genre et al. Annamaria Helling. 8372;902(11): 2061-2068  (http://education.[employer].com/faq/FAQ164)           Failed - HBA1C is between 0 and 7.9 and within 180 days    Hemoglobin A1C  Date Value Ref Range Status  11/07/2020 10.0 (A) 4.0 - 5.6 % Final   HbA1c, POC (controlled diabetic range)  Date Value Ref Range Status  01/20/2018 9.6 (A) 0.0 - 7.0 % Final          Failed - eGFR in normal range and within 360 days    GFR, Est African American  Date Value Ref Range Status  01/20/2018 82 > OR = 60 mL/min/1.67m Final   GFR calc Af Amer  Date Value Ref Range Status  01/06/2020 >60 >60 mL/min Final   GFR, Est Non African American  Date Value Ref Range Status   01/20/2018 71 > OR = 60 mL/min/1.743mFinal   GFR calc non Af Amer  Date Value Ref Range Status  01/06/2020 >60 >60 mL/min Final          Passed - Cr in normal range and within 360 days    Creat  Date Value Ref Range Status  11/07/2020 1.20 0.70 - 1.25 mg/dL Final    Comment:    For patients >4965ears of age, the reference limit for Creatinine is approximately 13% higher for people identified as African-American. .    Creatinine, Urine  Date Value Ref Range Status  01/20/2020 16 (L) 20 - 320 mg/dL Final          Passed - Valid encounter within last 6 months    Recent Outpatient Visits           3 months ago [facility]album[facility] to type 2 [facility]   1 year ago Blood pressure check   [place] 1 year [facility] Memory change   [facility]   1 year ago [facility] with [facility]  [facility]   1 year ago Coughing   [facility]

## 2021-02-28 NOTE — Telephone Encounter (Signed)
Patient was dismissed from the practice. No longer a patient

## 2021-09-13 ENCOUNTER — Other Ambulatory Visit: Payer: Self-pay | Admitting: Unknown Physician Specialty

## 2021-09-13 DIAGNOSIS — E1129 Type 2 diabetes mellitus with other diabetic kidney complication: Secondary | ICD-10-CM

## 2021-09-17 NOTE — Telephone Encounter (Signed)
Please close

## 2021-09-17 NOTE — Telephone Encounter (Signed)
Requested medications are due for refill today.  yes ? ?Requested medications are on the active medications list.  yes ? ?Last refill. 11/07/2020 #90 1 refill ? ?Future visit scheduled.   No - has appt with new provider 10/2021 ? ?Notes to clinic.  Last seen 11/07/2020 ? ? ? ?Requested Prescriptions  ?Pending Prescriptions Disp Refills  ? JANUVIA 100 MG tablet [Pharmacy Med Name: Januvia 100 MG Oral Tablet] 90 tablet 0  ?  Sig: Take 1 tablet by mouth once daily  ?  ? Endocrinology:  Diabetes - DPP-4 Inhibitors Failed - 09/13/2021  5:36 PM  ?  ?  Failed - HBA1C is between 0 and 7.9 and within 180 days  ?  Hemoglobin A1C  ?Date Value Ref Range Status  ?11/07/2020 10.0 (A) 4.0 - 5.6 % Final  ? ?HbA1c, POC (controlled diabetic range)  ?Date Value Ref Range Status  ?01/20/2018 9.6 (A) 0.0 - 7.0 % Final  ?  ?  ?  ?  Failed - Valid encounter within last 6 months  ?  Recent Outpatient Visits   ? ?      ? 10 months ago Microalbuminuria due to type 2 diabetes mellitus ([place])  ? [place], NP  ? 1 year ago Blood pressure check  ? [place]  ? 1 year ago Memory change  ? [facility] [place], Drue Stager, MD  ? 1 year ago Hypertension associated with type 2 diabetes mellitus ([place])  ? [facility] Steele Sizer, MD  ? 1 year ago Coughing  ? [facility] Delsa Grana, [place]  ? ?  ?  ?Future Appointments   ? ?        ? In 1 month Cletis [place], MD [facility]  ? ?  ? ?  ?  ?  Passed - Cr in normal range and within 360 days  ?  Creat  ?Date Value Ref Range Status  ?11/07/2020 1.20 0.70 - 1.25 mg/dL Final  ?  Comment:  ?  For patients >75 years of age, the reference limit ?for Creatinine is approximately 13% higher for people ?identified as African-American. ?. ?  ? ?Creatinine, Urine  ?Date Value Ref Range Status  ?01/20/2020 16 (L) 20 - 320 mg/dL Final  ?  ?  ?  ?  ?  ?

## 2021-09-17 NOTE — Telephone Encounter (Signed)
Not a patient at [facility]

## 2021-09-20 ENCOUNTER — Other Ambulatory Visit: Payer: Self-pay

## 2021-09-20 ENCOUNTER — Emergency Department
Admission: EM | Admit: 2021-09-20 | Discharge: 2021-09-21 | Disposition: A | Discharge status: Home / Self Care | Payer: Medicaid Other | Attending: Emergency Medicine | Admitting: Emergency Medicine

## 2021-09-20 ENCOUNTER — Encounter: Payer: Self-pay | Admitting: Emergency Medicine

## 2021-09-20 DIAGNOSIS — Z760 Encounter for issue of repeat prescription: Secondary | ICD-10-CM | POA: Insufficient documentation

## 2021-09-20 DIAGNOSIS — R059 Cough, unspecified: Secondary | ICD-10-CM | POA: Diagnosis present

## 2021-09-20 DIAGNOSIS — Z20822 Contact with and (suspected) exposure to covid-19: Secondary | ICD-10-CM | POA: Insufficient documentation

## 2021-09-20 DIAGNOSIS — J40 Bronchitis, not specified as acute or chronic: Secondary | ICD-10-CM | POA: Diagnosis not present

## 2021-09-20 DIAGNOSIS — E1129 Type 2 diabetes mellitus with other diabetic kidney complication: Secondary | ICD-10-CM

## 2021-09-20 DIAGNOSIS — R809 Proteinuria, unspecified: Secondary | ICD-10-CM

## 2021-09-20 LAB — GROUP A STREP BY PCR: Group A Strep by PCR: NOT DETECTED

## 2021-09-20 LAB — RESP PANEL BY RT-PCR (FLU A&B, COVID) ARPGX2
Influenza A by PCR: NEGATIVE
Influenza B by PCR: NEGATIVE
SARS Coronavirus 2 by RT PCR: NEGATIVE

## 2021-09-20 MED ORDER — ALBUTEROL SULFATE (2.5 MG/3ML) 0.083% IN NEBU
2.5000 mg | INHALATION_SOLUTION | Freq: Once | RESPIRATORY_TRACT | Status: AC
  Administered 2021-09-20: 2.5 mg via RESPIRATORY_TRACT
  Filled 2021-09-20: qty 3

## 2021-09-20 MED ORDER — VALSARTAN-HYDROCHLOROTHIAZIDE 320-25 MG PO TABS: 1.0000 | ORAL_TABLET | Freq: Every day | ORAL | 1 refills | Status: DC

## 2021-09-20 MED ORDER — METHYLPREDNISOLONE SODIUM SUCC 125 MG IJ SOLR
125.0000 mg | Freq: Once | INTRAMUSCULAR | Status: AC
  Administered 2021-09-20: 125 mg via INTRAMUSCULAR
  Filled 2021-09-20: qty 2

## 2021-09-20 MED ORDER — SITAGLIPTIN PHOSPHATE 100 MG PO TABS: 100.0000 mg | ORAL_TABLET | Freq: Every day | ORAL | 1 refills | Status: DC

## 2021-09-20 MED ORDER — ATORVASTATIN CALCIUM 40 MG PO TABS
40.0000 mg | ORAL_TABLET | Freq: Every day | ORAL | 1 refills | Status: DC
Start: 2021-09-20 — End: 2022-07-07

## 2021-09-20 MED ORDER — PREDNISONE 50 MG PO TABS: 50.0000 mg | ORAL_TABLET | Freq: Every day | ORAL | 0 refills | Status: DC

## 2021-09-20 MED ORDER — EMPAGLIFLOZIN 25 MG PO TABS: 25.0000 mg | ORAL_TABLET | Freq: Every day | ORAL | 1 refills | Status: DC

## 2021-09-20 MED ORDER — BENZONATATE 100 MG PO CAPS: 100.0000 mg | ORAL_CAPSULE | Freq: Three times a day (TID) | ORAL | 0 refills | Status: DC | PRN

## 2021-09-20 MED ORDER — ALBUTEROL SULFATE HFA 108 (90 BASE) MCG/ACT IN AERS: 2.0000 | INHALATION_SPRAY | RESPIRATORY_TRACT | 0 refills | Status: DC | PRN

## 2021-09-20 NOTE — ED Provider Notes (Signed)
? ?[facility] ?Provider Note ? ?Patient Contact: 10:25 PM (approximate) ? ? ?History  ? ?Sore Throat ? ? ?HPI ? ?Daniel Powell is a 66 y.o. male who presents to the emergency department complaining of cough, congestion, sore throat.  Patient has had 4 days of symptoms.  Patient is primarily complaining of a dry cough.  He states that he has no shortness of breath, no chest pain but is having a hacking dry cough.  Patient has some mild sore throat has felt chilled but has had no fevers.  No emesis, diarrhea or constipation. ?  ? ? ?Physical Exam  ? ?Triage Vital Signs: ?ED Triage Vitals  ?Enc Vitals Group  ?   BP 09/20/21 1937 (!) 175/79  ?   Pulse Rate 09/20/21 1937 99  ?   Resp 09/20/21 1937 20  ?   Temp 09/20/21 1937 98.2 ?F (36.8 ?C)  ?   Temp Source 09/20/21 1937 Oral  ?   SpO2 09/20/21 1937 94 %  ?   Weight 09/20/21 1939 132 lb 4.4 oz (60 kg)  ?   Height 09/20/21 1939 5' 6.93" (1.7 m)  ?   Head Circumference --   ?   Peak Flow --   ?   Pain Score --   ?   Pain Loc --   ?   Pain Edu? --   ?   Excl. in [place]? --   ? ? ?Most recent vital signs: ?Vitals:  ? 09/20/21 1937 09/20/21 2352  ?BP: (!) 175/79 (!) 152/60  ?Pulse: 99 92  ?Resp: 20 19  ?Temp: 98.2 ?F (36.8 ?C) 98.9 ?F (37.2 ?C)  ?SpO2: 94% 99%  ? ? ? ?General: Alert and in no acute distress. ?ENT: ?     Ears:  ?     Nose: No congestion/rhinnorhea. ?     Mouth/Throat: Mucous membranes are moist. ?Neck: No stridor. No cervical spine tenderness to palpation.  ?Cardiovascular:  Good peripheral perfusion ?Respiratory: Normal respiratory effort without tachypnea or retractions. Lungs CTAB. Good air entry to the bases with no decreased or absent breath sounds. ?Gastrointestinal: Bowel sounds ?4 quadrants. Soft and nontender to palpation. No guarding or rigidity. No palpable masses. No distention.  ?Musculoskeletal: Full range of motion to all extremities.  ?Neurologic:  No gross focal neurologic deficits are appreciated.  ?Skin:   No rash  noted ?Other: ? ? ?ED Results / Procedures / Treatments  ? ?Labs ?(all labs ordered are listed, but only abnormal results are displayed) ?Labs Reviewed  ?GROUP A STREP BY PCR  ?RESP PANEL BY RT-PCR (FLU A&B, COVID) [id number]  ?CBG MONITORING, ED  ? ? ? ?EKG ? ? ? ? ?RADIOLOGY ? ? ? ?No results found. ? ?PROCEDURES: ? ?Critical Care performed: No ? ?Procedures ? ? ?MEDICATIONS ORDERED IN ED: ?Medications  ?acetaminophen (TYLENOL) tablet 650 mg (has no administration in time range)  ?ibuprofen (ADVIL) tablet 600 mg (has no administration in time range)  ?methylPREDNISolone sodium succinate (SOLU-MEDROL) 125 mg/2 mL injection 125 mg (125 mg Intramuscular Given 09/20/21 2340)  ?albuterol (PROVENTIL) (2.5 MG/3ML) 0.083% nebulizer solution 2.5 mg (2.5 mg Nebulization Given 09/20/21 2337)  ? ? ? ?IMPRESSION / MDM / ASSESSMENT AND PLAN / ED COURSE  ?I reviewed the triage vital signs and the nursing notes. ?             ?               ? ?Differential diagnosis  includes, but is not limited to, viral illness, COVID, flu, bronchitis, pneumonia ? ? ?Patient's diagnosis is consistent with bronchitis.  Patient presents emergency department with a dry hacking cough.  He does have some sore throat and congestion he has had some chills but no fevers.  Patient exam is overall reassuring.  Good air entry into the lungs with no appreciable wheezing or other adventitious lung sounds.  COVID, flu, strep are negative.  Patient will be prescribed steroid, cough medication, albuterol.  He is given a dose of steroid and albuterol which does improve the symptoms while he is in the emergency department..  Patient was also requesting a refill of his medications.  He states that he is to see his primary care next week, but ran out of refills and his primary care will not refill his prescriptions until he is seen.  Patient is on blood pressure, cholesterol and diabetes medications.  I will prescribe the patient refills until he can see his primary  care provider.  Return precautions in regards to his bronchitis are discussed.  Follow-up with his primary care in week.. Patient is given ED precautions to return to the ED for any worsening or new symptoms. ? ? ? ?  ? ? ?FINAL CLINICAL IMPRESSION(S) / ED DIAGNOSES  ? ?Final diagnoses:  ?Bronchitis  ?Medication refill  ? ? ? ?Rx / DC Orders  ? ?ED Discharge Orders   ? ?      Ordered  ?  empagliflozin (JARDIANCE) 25 MG TABS tablet  Daily       ? 09/20/21 2223  ?  sitaGLIPtin (JANUVIA) 100 MG tablet  Daily       ? 09/20/21 2223  ?  valsartan-hydrochlorothiazide (DIOVAN HCT) 320-25 MG tablet  Daily       ? 09/20/21 2223  ?  atorvastatin (LIPITOR) 40 MG tablet  Daily       ? 09/20/21 2223  ?  predniSONE (DELTASONE) 50 MG tablet  Daily with breakfast       ? 09/20/21 2225  ?  albuterol (VENTOLIN HFA) 108 (90 Base) MCG/ACT inhaler  Every 4 hours PRN       ? 09/20/21 2225  ?  benzonatate (TESSALON PERLES) 100 MG capsule  3 times daily PRN       ? 09/20/21 2225  ? ?  ?  ? ?  ? ? ? ?Note:  This document was prepared using Dragon voice recognition software and may include unintentional dictation errors. ?  ?Darletta Moll, PA-C ?09/21/21 0009 ? ?  ?Rada Hay, MD ?09/21/21 [phone] ? ?

## 2021-09-20 NOTE — ED Triage Notes (Signed)
Pt presents via POV with complaints of sore throat, cough, and runny nose starting 4 days ago. Pt denies trying any medications for his sx. Denies fevers, CP, or SOB ? ?Arabic video interpreter utilized for triage.  ?

## 2021-09-21 LAB — CBG MONITORING, ED: Glucose-Capillary: 194 mg/dL — ABNORMAL HIGH (ref 70–99)

## 2021-09-21 MED ORDER — ACETAMINOPHEN 325 MG PO TABS
650.0000 mg | ORAL_TABLET | Freq: Once | ORAL | Status: AC
  Administered 2021-09-21: 650 mg via ORAL
  Filled 2021-09-21: qty 2

## 2021-09-21 MED ORDER — IBUPROFEN 600 MG PO TABS
600.0000 mg | ORAL_TABLET | Freq: Once | ORAL | Status: AC
Start: 2021-09-21 — End: 2021-09-21
  Administered 2021-09-21: 600 mg via ORAL
  Filled 2021-09-21: qty 1

## 2021-09-21 NOTE — ED Notes (Signed)
Pt verbalized understanding of discharge instructions, prescriptions, and follow-up car instructions. Pt advised if symptoms worsen to return to ED. E-signature not available due to signature pad not working. Arabic interpreter was used for discharge teaching.  ?

## 2021-09-24 ENCOUNTER — Telehealth: Payer: Self-pay

## 2021-09-24 NOTE — Telephone Encounter (Signed)
Transition Care Management Unsuccessful Follow-up Telephone Call ? ?Date of discharge and from where:  09/21/2021 from [facility] ? ?Attempts:  1st Attempt ? ?Reason for unsuccessful TCM follow-up call:  Unable to leave message ? ? ? ?

## 2021-09-26 NOTE — Telephone Encounter (Signed)
Transition Care Management Unsuccessful Follow-up Telephone Call ? ?Date of discharge and from where:  09/21/2021 from [facility] ? ?Attempts:  2nd Attempt ? ?Reason for unsuccessful TCM follow-up call:  Unable to leave message ? ? ? ?

## 2021-09-27 NOTE — Telephone Encounter (Signed)
Transition Care Management Unsuccessful Follow-up Telephone Call ? ?Date of discharge and from where:  09/21/2021 from [facility] ? ?Attempts:  3rd Attempt ? ?Reason for unsuccessful TCM follow-up call:  Unable to reach patient ? ? ? ?

## 2021-10-30 ENCOUNTER — Ambulatory Visit: Payer: Medicaid Other | Admitting: Internal Medicine

## 2021-11-11 ENCOUNTER — Telehealth: Payer: Self-pay | Admitting: Unknown Physician Specialty

## 2021-11-11 DIAGNOSIS — E1129 Type 2 diabetes mellitus with other diabetic kidney complication: Secondary | ICD-10-CM

## 2021-11-13 NOTE — Telephone Encounter (Signed)
No longer our pt °

## 2021-11-13 NOTE — Telephone Encounter (Signed)
Requested Prescriptions  Pending Prescriptions Disp Refills  . glimepiride (AMARYL) 4 MG tablet [Pharmacy Med Name: Glimepiride 4 MG Oral Tablet] 30 tablet 0    Sig: TAKE 1 TABLET BY MOUTH ONCE DAILY BEFORE BREAKFAST TO REPLACE GLIPIZIDE PER PATIENT REQUEST     Endocrinology:  Diabetes - Sulfonylureas Failed - 11/11/2021  4:25 PM      Failed - HBA1C is between 0 and 7.9 and within 180 days    Hemoglobin A1C  Date Value Ref Range Status  11/07/2020 10.0 (A) 4.0 - 5.6 % Final   HbA1c, POC (controlled diabetic range)  Date Value Ref Range Status  01/20/2018 9.6 (A) 0.0 - 7.0 % Final         Failed - Cr in normal range and within 360 days    Creat  Date Value Ref Range Status  11/07/2020 1.20 0.70 - 1.25 mg/dL Final    Comment:    For patients >91 years of age, the reference limit for Creatinine is approximately 13% higher for people identified as African-American. .    Creatinine, Urine  Date Value Ref Range Status  01/20/2020 16 (L) 20 - 320 mg/dL Final         Failed - Valid encounter within last 6 months    Recent Outpatient Visits          1 year ago Microalbuminuria due to type 2 diabetes mellitus [facility])   [facility] Kathrine Haddock, NP   1 year ago Blood pressure check   [place]   1 year ago Memory change   [facility] [place], Drue Stager, MD   1 year ago Hypertension associated with type 2 diabetes mellitus [facility])   [facility] Steele Sizer, MD   1 year ago Coughing   [facility] Delsa Grana, [place]

## 2022-07-04 ENCOUNTER — Other Ambulatory Visit: Payer: Self-pay

## 2022-07-04 DIAGNOSIS — R0602 Shortness of breath: Secondary | ICD-10-CM | POA: Diagnosis not present

## 2022-07-04 DIAGNOSIS — Z7984 Long term (current) use of oral hypoglycemic drugs: Secondary | ICD-10-CM | POA: Diagnosis not present

## 2022-07-04 DIAGNOSIS — Z79899 Other long term (current) drug therapy: Secondary | ICD-10-CM | POA: Diagnosis not present

## 2022-07-04 DIAGNOSIS — E1140 Type 2 diabetes mellitus with diabetic neuropathy, unspecified: Secondary | ICD-10-CM | POA: Insufficient documentation

## 2022-07-04 DIAGNOSIS — E11649 Type 2 diabetes mellitus with hypoglycemia without coma: Secondary | ICD-10-CM | POA: Diagnosis not present

## 2022-07-04 DIAGNOSIS — U071 COVID-19: Principal | ICD-10-CM | POA: Insufficient documentation

## 2022-07-04 DIAGNOSIS — D72829 Elevated white blood cell count, unspecified: Secondary | ICD-10-CM | POA: Insufficient documentation

## 2022-07-04 DIAGNOSIS — I10 Essential (primary) hypertension: Secondary | ICD-10-CM | POA: Diagnosis not present

## 2022-07-04 DIAGNOSIS — R059 Cough, unspecified: Secondary | ICD-10-CM | POA: Diagnosis present

## 2022-07-04 DIAGNOSIS — E7849 Other hyperlipidemia: Secondary | ICD-10-CM | POA: Insufficient documentation

## 2022-07-04 LAB — CBC
HCT: 40.1 % (ref 39.0–52.0)
Hemoglobin: 12.8 g/dL — ABNORMAL LOW (ref 13.0–17.0)
MCH: 29.2 pg (ref 26.0–34.0)
MCHC: 31.9 g/dL (ref 30.0–36.0)
MCV: 91.3 fL (ref 80.0–100.0)
Platelets: 160 10*3/uL (ref 150–400)
RBC: 4.39 MIL/uL (ref 4.22–5.81)
RDW: 15.3 % (ref 11.5–15.5)
WBC: 6.3 10*3/uL (ref 4.0–10.5)
nRBC: 0 % (ref 0.0–0.2)

## 2022-07-04 LAB — RESP PANEL BY RT-PCR (RSV, FLU A&B, COVID)  RVPGX2
Influenza A by PCR: NEGATIVE
Influenza B by PCR: NEGATIVE
Resp Syncytial Virus by PCR: NEGATIVE
SARS Coronavirus 2 by RT PCR: POSITIVE — AB

## 2022-07-04 LAB — URINALYSIS, ROUTINE W REFLEX MICROSCOPIC
Bacteria, UA: NONE SEEN
Bilirubin Urine: NEGATIVE
Glucose, UA: >=500 mg/dL — AB
Hgb urine dipstick: NEGATIVE
Ketones, ur: NEGATIVE mg/dL
Leukocytes,Ua: NEGATIVE
Nitrite: NEGATIVE
Protein, ur: 100 mg/dL — AB
Specific Gravity, Urine: 1.025 (ref 1.005–1.030)
pH: 5 (ref 5.0–8.0)

## 2022-07-04 LAB — BASIC METABOLIC PANEL
Anion gap: 7 (ref 5–15)
BUN: 20 mg/dL (ref 8–23)
CO2: 28 mmol/L (ref 22–32)
Calcium: 9.2 mg/dL (ref 8.9–10.3)
Chloride: 100 mmol/L (ref 98–111)
Creatinine, Ser: 1.15 mg/dL (ref 0.61–1.24)
GFR, Estimated: >60 mL/min (ref >60)
Glucose, Bld: 57 mg/dL — ABNORMAL LOW (ref 70–99)
Potassium: 3.9 mmol/L (ref 3.5–5.1)
Sodium: 135 mmol/L (ref 135–145)

## 2022-07-04 LAB — CBG MONITORING, ED
Glucose-Capillary: 126 mg/dL — ABNORMAL HIGH (ref 70–99)
Glucose-Capillary: 48 mg/dL — ABNORMAL LOW (ref 70–99)
Glucose-Capillary: 50 mg/dL — ABNORMAL LOW (ref 70–99)
Glucose-Capillary: 51 mg/dL — ABNORMAL LOW (ref 70–99)
Glucose-Capillary: 78 mg/dL (ref 70–99)

## 2022-07-04 MED ORDER — DEXTROSE 50 % IV SOLN
INTRAVENOUS | Status: AC
  Filled 2022-07-04: qty 50

## 2022-07-04 MED ORDER — DEXTROSE 50 % IV SOLN
1.0000 | Freq: Once | INTRAVENOUS | Status: AC
Start: 2022-07-04 — End: 2022-07-04
  Administered 2022-07-04: 50 mL via INTRAVENOUS
  Filled 2022-07-04: qty 50

## 2022-07-04 NOTE — ED Triage Notes (Signed)
Pt comes from home via POV c/o hypoglycemia. Pt states yesterday started having a low blood sugar readings. Pt was given chocolate at home to try to bring it up, went up to 180s, but was then given his diabetes medication. Pt feeling nauseous, dizzy, and no appetite due to not wanting to spike blood sugar. Pt in NAD at this time.  CBG 48 in triage, given orange juice at this time

## 2022-07-04 NOTE — ED Provider Triage Note (Signed)
Emergency Medicine Provider Triage Evaluation Note  History limited by Arabic language.  Patient's adult son present for interpretation.  Daniel Powell, a 67 y.o. male  was evaluated in triage.  Pt complains of hypoglycemia.  Presents from home with several episodes of documented hypoglycemia with blood sugars less than 50.  Patient was treated yesterday with a piece of chocolate, which brought his sugars up to about 180, family was uncomfortable with headache, and gave the patient his diabetic medicine right after.  Blood sugar dropped again to less than 50.  Patient reports some nausea, dizziness, lack of appetite.  Review of Systems  Positive: hypoglycemia Negative: syncope  Physical Exam  BP (!) 129/53   Pulse 90   Temp 99.7 F (37.6 C) (Oral)   Resp (!) 22   SpO2 95%  Gen:   Awake, no distress  NAD Resp:  Normal effort CTA MSK:   Moves extremities without difficulty  Other:    Medical Decision Making  Medically screening exam initiated at 8:33 PM.  Appropriate orders placed.  Daniel Powell was informed that the remainder of the evaluation will be completed by another provider, this initial triage assessment does not replace that evaluation, and the importance of remaining in the ED until their evaluation is complete.  Geriatric patient to the ED for evaluation of episodes of hypoglycemia, nausea, dizziness over the last few days.   Melvenia Needles, PA-C 07/04/22 2035

## 2022-07-05 ENCOUNTER — Emergency Department: Payer: Medicaid Other

## 2022-07-05 ENCOUNTER — Encounter: Payer: Self-pay | Admitting: Family Medicine

## 2022-07-05 ENCOUNTER — Observation Stay
Admission: EM | Admit: 2022-07-05 | Discharge: 2022-07-07 | Disposition: A | Discharge status: Home / Self Care | Payer: Medicaid Other | Attending: Internal Medicine | Admitting: Internal Medicine

## 2022-07-05 DIAGNOSIS — U071 COVID-19: Secondary | ICD-10-CM

## 2022-07-05 DIAGNOSIS — I10 Essential (primary) hypertension: Secondary | ICD-10-CM

## 2022-07-05 DIAGNOSIS — D696 Thrombocytopenia, unspecified: Secondary | ICD-10-CM | POA: Insufficient documentation

## 2022-07-05 DIAGNOSIS — E785 Hyperlipidemia, unspecified: Secondary | ICD-10-CM | POA: Diagnosis not present

## 2022-07-05 DIAGNOSIS — E162 Hypoglycemia, unspecified: Secondary | ICD-10-CM | POA: Diagnosis not present

## 2022-07-05 DIAGNOSIS — E1129 Type 2 diabetes mellitus with other diabetic kidney complication: Secondary | ICD-10-CM

## 2022-07-05 DIAGNOSIS — R509 Fever, unspecified: Secondary | ICD-10-CM

## 2022-07-05 DIAGNOSIS — E1142 Type 2 diabetes mellitus with diabetic polyneuropathy: Secondary | ICD-10-CM | POA: Insufficient documentation

## 2022-07-05 LAB — GLUCOSE, CAPILLARY
Glucose-Capillary: 263 mg/dL — ABNORMAL HIGH (ref 70–99)
Glucose-Capillary: 350 mg/dL — ABNORMAL HIGH (ref 70–99)
Glucose-Capillary: 338 mg/dL — ABNORMAL HIGH (ref 70–99)

## 2022-07-05 LAB — TROPONIN I (HIGH SENSITIVITY)
Troponin I (High Sensitivity): 7 ng/L (ref <18)
Troponin I (High Sensitivity): 6 ng/L (ref <18)
Troponin I (High Sensitivity): 5 ng/L (ref <18)
Troponin I (High Sensitivity): 4 ng/L (ref <18)

## 2022-07-05 LAB — FIBRINOGEN: Fibrinogen: 462 mg/dL (ref 210–475)

## 2022-07-05 LAB — HEPATITIS B SURFACE ANTIGEN: Hepatitis B Surface Ag: NONREACTIVE

## 2022-07-05 LAB — CBG MONITORING, ED
Glucose-Capillary: 134 mg/dL — ABNORMAL HIGH (ref 70–99)
Glucose-Capillary: 204 mg/dL — ABNORMAL HIGH (ref 70–99)
Glucose-Capillary: 298 mg/dL — ABNORMAL HIGH (ref 70–99)
Glucose-Capillary: 84 mg/dL (ref 70–99)

## 2022-07-05 LAB — HIV ANTIBODY (ROUTINE TESTING W REFLEX): HIV Screen 4th Generation wRfx: NONREACTIVE

## 2022-07-05 LAB — TYPE AND SCREEN
ABO/RH(D): O NEG
Antibody Screen: NEGATIVE

## 2022-07-05 LAB — C-REACTIVE PROTEIN: CRP: 1.6 mg/dL — ABNORMAL HIGH (ref <1.0)

## 2022-07-05 LAB — FERRITIN: Ferritin: 64 ng/mL (ref 24–336)

## 2022-07-05 LAB — ABO/RH: ABO/RH(D): O NEG

## 2022-07-05 LAB — LACTATE DEHYDROGENASE: LDH: 138 U/L (ref 98–192)

## 2022-07-05 LAB — D-DIMER, QUANTITATIVE: D-Dimer, Quant: 0.58 ug/mL-FEU — ABNORMAL HIGH (ref 0.00–0.50)

## 2022-07-05 LAB — PROCALCITONIN: Procalcitonin: <0.1 ng/mL

## 2022-07-05 LAB — BRAIN NATRIURETIC PEPTIDE: B Natriuretic Peptide: 83.2 pg/mL (ref 0.0–100.0)

## 2022-07-05 MED ORDER — LINAGLIPTIN 5 MG PO TABS
5.0000 mg | ORAL_TABLET | Freq: Every day | ORAL | Status: DC
  Filled 2022-07-05: qty 1

## 2022-07-05 MED ORDER — ALLOPURINOL 100 MG PO TABS
300.0000 mg | ORAL_TABLET | Freq: Every day | ORAL | Status: DC
  Administered 2022-07-05 – 2022-07-07 (×3): 300 mg via ORAL
  Filled 2022-07-05: qty 1
  Filled 2022-07-05 (×2): qty 3

## 2022-07-05 MED ORDER — ENOXAPARIN SODIUM 40 MG/0.4ML IJ SOSY
40.0000 mg | PREFILLED_SYRINGE | INTRAMUSCULAR | Status: DC
  Administered 2022-07-05 – 2022-07-07 (×3): 40 mg via SUBCUTANEOUS
  Filled 2022-07-05 (×3): qty 0.4

## 2022-07-05 MED ORDER — IRBESARTAN 150 MG PO TABS
300.0000 mg | ORAL_TABLET | Freq: Every day | ORAL | Status: DC
  Administered 2022-07-05 – 2022-07-07 (×3): 300 mg via ORAL
  Filled 2022-07-05 (×3): qty 2

## 2022-07-05 MED ORDER — HYDROCOD POLI-CHLORPHE POLI ER 10-8 MG/5ML PO SUER
5.0000 mL | Freq: Two times a day (BID) | ORAL | Status: DC | PRN
  Administered 2022-07-06 – 2022-07-07 (×3): 5 mL via ORAL
  Filled 2022-07-05 (×3): qty 5

## 2022-07-05 MED ORDER — ALBUTEROL SULFATE HFA 108 (90 BASE) MCG/ACT IN AERS: 2.0000 | INHALATION_SPRAY | RESPIRATORY_TRACT | Status: DC | PRN

## 2022-07-05 MED ORDER — ZINC SULFATE 220 (50 ZN) MG PO CAPS
220.0000 mg | ORAL_CAPSULE | Freq: Every day | ORAL | Status: DC
  Administered 2022-07-05 – 2022-07-07 (×3): 220 mg via ORAL
  Filled 2022-07-05 (×3): qty 1

## 2022-07-05 MED ORDER — TRAZODONE HCL 50 MG PO TABS: 25.0000 mg | ORAL_TABLET | Freq: Every evening | ORAL | Status: DC | PRN

## 2022-07-05 MED ORDER — DEXTROSE 50 % IV SOLN
1.0000 | Freq: Once | INTRAVENOUS | Status: AC
  Administered 2022-07-04: 50 mL via INTRAVENOUS

## 2022-07-05 MED ORDER — ONDANSETRON HCL 4 MG/2ML IJ SOLN: 4.0000 mg | Freq: Four times a day (QID) | INTRAMUSCULAR | Status: DC | PRN

## 2022-07-05 MED ORDER — ATORVASTATIN CALCIUM 20 MG PO TABS: 40.0000 mg | ORAL_TABLET | Freq: Every day | ORAL | Status: DC

## 2022-07-05 MED ORDER — ACETAMINOPHEN 325 MG PO TABS
650.0000 mg | ORAL_TABLET | Freq: Once | ORAL | Status: AC | PRN
  Administered 2022-07-05: 650 mg via ORAL
  Filled 2022-07-05: qty 2

## 2022-07-05 MED ORDER — ONDANSETRON HCL 4 MG PO TABS: 4.0000 mg | ORAL_TABLET | Freq: Four times a day (QID) | ORAL | Status: DC | PRN

## 2022-07-05 MED ORDER — PREDNISONE 50 MG PO TABS: 50.0000 mg | ORAL_TABLET | Freq: Every day | ORAL | Status: DC

## 2022-07-05 MED ORDER — HYDROCHLOROTHIAZIDE 12.5 MG PO TABS: 12.5000 mg | ORAL_TABLET | Freq: Every day | ORAL | Status: DC

## 2022-07-05 MED ORDER — EMPAGLIFLOZIN 25 MG PO TABS
25.0000 mg | ORAL_TABLET | Freq: Every day | ORAL | Status: DC
  Filled 2022-07-05: qty 1

## 2022-07-05 MED ORDER — INSULIN ASPART 100 UNIT/ML IJ SOLN
0.0000 [IU] | Freq: Three times a day (TID) | INTRAMUSCULAR | Status: DC
  Administered 2022-07-05: 3 [IU] via SUBCUTANEOUS
  Administered 2022-07-05: 5 [IU] via SUBCUTANEOUS
  Administered 2022-07-05: 3 [IU] via SUBCUTANEOUS
  Administered 2022-07-06: 2 [IU] via SUBCUTANEOUS
  Administered 2022-07-06: 1 [IU] via SUBCUTANEOUS
  Administered 2022-07-06: 3 [IU] via SUBCUTANEOUS
  Administered 2022-07-07: 2 [IU] via SUBCUTANEOUS
  Filled 2022-07-05 (×7): qty 1

## 2022-07-05 MED ORDER — METHYLPREDNISOLONE SODIUM SUCC 125 MG IJ SOLR
1.0000 mg/kg | Freq: Two times a day (BID) | INTRAMUSCULAR | Status: DC
  Administered 2022-07-05: 71.875 mg via INTRAVENOUS
  Filled 2022-07-05: qty 2

## 2022-07-05 MED ORDER — DEXTROSE-NACL 10-0.45 % IV SOLN
INTRAVENOUS | Status: DC
  Filled 2022-07-05 (×3): qty 1000

## 2022-07-05 MED ORDER — VITAMIN C 500 MG PO TABS
500.0000 mg | ORAL_TABLET | Freq: Every day | ORAL | Status: DC
  Administered 2022-07-05 – 2022-07-07 (×3): 500 mg via ORAL
  Filled 2022-07-05 (×3): qty 1

## 2022-07-05 MED ORDER — INSULIN ASPART 100 UNIT/ML IJ SOLN
0.0000 [IU] | Freq: Every day | INTRAMUSCULAR | Status: DC
  Administered 2022-07-05: 4 [IU] via SUBCUTANEOUS
  Administered 2022-07-06: 3 [IU] via SUBCUTANEOUS
  Filled 2022-07-05 (×2): qty 1

## 2022-07-05 MED ORDER — DEXAMETHASONE 4 MG PO TABS: 6.0000 mg | ORAL_TABLET | Freq: Every day | ORAL | Status: DC

## 2022-07-05 MED ORDER — ACETAMINOPHEN 325 MG PO TABS
650.0000 mg | ORAL_TABLET | Freq: Four times a day (QID) | ORAL | Status: DC | PRN
  Administered 2022-07-05 – 2022-07-06 (×2): 650 mg via ORAL
  Filled 2022-07-05 (×2): qty 2

## 2022-07-05 MED ORDER — VALSARTAN-HYDROCHLOROTHIAZIDE 320-12.5 MG PO TABS: 1.0000 | ORAL_TABLET | Freq: Every day | ORAL | Status: DC

## 2022-07-05 MED ORDER — DEXTROSE 10 % IV SOLN: INTRAVENOUS | Status: DC

## 2022-07-05 MED ORDER — NIRMATRELVIR/RITONAVIR (PAXLOVID)TABLET
3.0000 | ORAL_TABLET | Freq: Two times a day (BID) | ORAL | Status: DC
  Administered 2022-07-05 – 2022-07-07 (×5): 3 via ORAL
  Filled 2022-07-05: qty 30

## 2022-07-05 MED ORDER — NIRMATRELVIR/RITONAVIR (PAXLOVID)TABLET: 3.0000 | ORAL_TABLET | Freq: Two times a day (BID) | ORAL | Status: DC

## 2022-07-05 MED ORDER — GUAIFENESIN-DM 100-10 MG/5ML PO SYRP
10.0000 mL | ORAL_SOLUTION | ORAL | Status: DC | PRN
  Administered 2022-07-05 – 2022-07-06 (×3): 10 mL via ORAL
  Filled 2022-07-05 (×3): qty 10

## 2022-07-05 MED ORDER — MAGNESIUM HYDROXIDE 400 MG/5ML PO SUSP: 30.0000 mL | Freq: Every day | ORAL | Status: DC | PRN

## 2022-07-05 MED ORDER — FAMOTIDINE 20 MG PO TABS
20.0000 mg | ORAL_TABLET | Freq: Two times a day (BID) | ORAL | Status: DC
  Administered 2022-07-05 – 2022-07-07 (×5): 20 mg via ORAL
  Filled 2022-07-05 (×5): qty 1

## 2022-07-05 NOTE — ED Notes (Signed)
Notified provider about current BG of 298 - per Dr. Roosevelt Locks OK to start SS insulin at this time. See MAR.

## 2022-07-05 NOTE — Assessment & Plan Note (Signed)
-  We will place the patient on p.o. Paxlovid. - Will follow inflammatory markers. - He will be on vitamin C and zinc sulfate.

## 2022-07-05 NOTE — H&P (Signed)
[place]   PATIENT NAME: Daniel Powell    MR#:  [id number]  DATE OF BIRTH:  17-Apr-1956  DATE OF ADMISSION:  07/05/2022  PRIMARY CARE PHYSICIAN: System, Provider Not In   Patient is coming from: Home  REQUESTING/REFERRING PHYSICIAN: Lurline Hare, MD  CHIEF COMPLAINT:   Chief Complaint  Patient presents with   Hypoglycemia    HISTORY OF PRESENT ILLNESS:  Daniel Powell is a 67 y.o. male with medical history significant for type 2 diabetes mellitus and hypertension, who presented to the emergency room with acute onset of dizziness with associated hypoglycemia.  He has been having dry cough with associated dyspnea and wheezing over the last few days.  No nausea or vomiting or diarrhea.  No loss of taste or smell.  No chest pain or palpitations.  No fever or chills.  No dysuria, oliguria or hematuria or flank pain.  The patient was given orange juice by EMS for blood glucose in the 50s and it later dropped again even after being given 1 amp of D50.  She was then given another amp of D50 then placed on D10W.  ED Course: Upon presentation to the emergency room, BP 129/53 with respiratory rate 22 with otherwise normal vital signs.  Blood glucose was 57 with otherwise unremarkable BMP.  CBC showed hemoglobin of 12.8 and hematocrit 40.1.  D-dimer was 0.58 with fibrinogen 462.  Procalcitonin was less than 0.1 with ferritin 64. EKG as reviewed by me : EKG showed normal sinus rhythm with a rate of 84. Imaging: Portable chest x-ray showed no acute cardiopulmonary disease.  The patient was given IV Solu-Medrol, 50 mg of p.o. prednisone, 650 mg of p.o. Tylenol and D50 followed by D10W.  He will be admitted to an observation medical telemetry bed for further evaluation and management. PAST MEDICAL HISTORY:   Past Medical History:  Diagnosis Date   Diabetes mellitus without complication ([place])    Hypertension     PAST SURGICAL HISTORY:   Past Surgical History:  Procedure Laterality Date    BACK SURGERY  2014   COLONOSCOPY WITH PROPOFOL N/A 03/11/2018   Procedure: COLONOSCOPY WITH PROPOFOL;  Surgeon: Jonathon Bellows, MD;  [facility];  Service: Gastroenterology;  Laterality: N/A;   HERNIA REPAIR      SOCIAL HISTORY:   Social History   Tobacco Use   Smoking status: Never   Smokeless tobacco: Never  Substance Use Topics   Alcohol use: No    Alcohol/week: 0.0 standard drinks of alcohol    FAMILY HISTORY:   Family History  Problem Relation Age of Onset   Heart attack Mother    Diabetes Mother    Hypertension Mother    Heart disease Father    Diabetes Father    Hypertension Father    Diabetes Sister    Hypertension Sister    Diabetes Son    Diabetes Son    Hip dysplasia Daughter     DRUG ALLERGIES:   Allergies  Allergen Reactions   Gadolinium Derivatives Itching    Pt began itching across ribs and back and neck s/p Multihance injection    REVIEW OF SYSTEMS:   ROS As per history of present illness. All pertinent systems were reviewed above. Constitutional, HEENT, cardiovascular, respiratory, GI, GU, musculoskeletal, neuro, psychiatric, endocrine, integumentary and hematologic systems were reviewed and are otherwise negative/unremarkable except for positive findings mentioned above in the HPI.   MEDICATIONS AT HOME:   Prior to Admission medications  Medication Sig Start Date End Date Taking? Authorizing Provider  allopurinol (ZYLOPRIM) 300 MG tablet Take 300 mg by mouth daily. 04/26/22  Yes [provider]  atorvastatin (LIPITOR) 40 MG tablet Take 1 tablet (40 mg total) by mouth daily. 09/20/21  Yes Cuthriell, Charline Bills, PA-C  empagliflozin (JARDIANCE) 25 MG TABS tablet Take 1 tablet (25 mg total) by mouth daily. 09/20/21  Yes Cuthriell, Charline Bills, PA-C  glimepiride (AMARYL) 4 MG tablet Take 1 tablet (4 mg total) by mouth daily before breakfast. 11/07/20  Yes Kathrine Haddock, NP  metFORMIN (GLUCOPHAGE) 1000 MG tablet Take 1 tablet (1,000  mg total) by mouth 2 (two) times daily with a meal. 11/07/20  Yes Kathrine Haddock, NP  pioglitazone (ACTOS) 45 MG tablet Take 45 mg by mouth every morning. 04/26/22  Yes [provider]  sitaGLIPtin (JANUVIA) 100 MG tablet Take 1 tablet (100 mg total) by mouth daily. 09/20/21  Yes Cuthriell, Charline Bills, PA-C  valsartan-hydrochlorothiazide (DIOVAN-HCT) 320-12.5 MG tablet Take 1 tablet by mouth daily. 07/03/22  Yes [provider]  albuterol (VENTOLIN HFA) 108 (90 Base) MCG/ACT inhaler Inhale 2 puffs into the lungs every 4 (four) hours as needed for wheezing or shortness of breath. 09/20/21   Cuthriell, Charline Bills, PA-C  traZODone (DESYREL) 50 MG tablet Take 0.5-1 tablets (25-50 mg total) by mouth at bedtime as needed for sleep. 01/29/20   Steele Sizer, MD      VITAL SIGNS:  Blood pressure (!) 144/76, pulse 80, temperature 99.2 F (37.3 C), temperature source Oral, resp. rate 20, height '5\' 6"'$  (1.676 m), weight 72 kg, SpO2 97 %.  PHYSICAL EXAMINATION:  Physical Exam  GENERAL:  67 y.o.-year-old male patient lying in the bed with no acute distress.  EYES: Pupils equal, round, reactive to light and accommodation. No scleral icterus. Extraocular muscles intact.  HEENT: Head atraumatic, normocephalic. Oropharynx and nasopharynx clear.  NECK:  Supple, no jugular venous distention. No thyroid enlargement, no tenderness.  LUNGS: Normal breath sounds bilaterally, no wheezing, rales,rhonchi or crepitation. No use of accessory muscles of respiration.  CARDIOVASCULAR: Regular rate and rhythm, S1, S2 normal. No murmurs, rubs, or gallops.  ABDOMEN: Soft, nondistended, nontender. Bowel sounds present. No organomegaly or mass.  EXTREMITIES: No pedal edema, cyanosis, or clubbing.  NEUROLOGIC: Cranial nerves II through XII are intact. Muscle strength 5/5 in all extremities. Sensation intact. Gait not checked.  PSYCHIATRIC: The patient is alert and oriented x 3.  Normal affect and good eye  contact. SKIN: No obvious rash, lesion, or ulcer.   LABORATORY PANEL:   CBC Recent Labs  Lab 07/04/22 2038  WBC 6.3  HGB 12.8*  HCT 40.1  PLT 160   ------------------------------------------------------------------------------------------------------------------  Chemistries  Recent Labs  Lab 07/04/22 2038  NA 135  K 3.9  CL 100  CO2 28  GLUCOSE 57*  BUN 20  CREATININE 1.15  CALCIUM 9.2   ------------------------------------------------------------------------------------------------------------------  Cardiac Enzymes No results for input(s): "TROPONINI" in the last 168 hours. ------------------------------------------------------------------------------------------------------------------  RADIOLOGY:  DG Chest Port 1 View  Result Date: 07/05/2022 CLINICAL DATA:  COVID positive EXAM: PORTABLE CHEST 1 VIEW COMPARISON:  01/06/2020 FINDINGS: The heart size and mediastinal contours are upper limits of normal. Low lung volumes. Both lungs are clear. The visualized skeletal structures are unremarkable. IMPRESSION: No active disease. Electronically Signed   By: Placido Sou M.D.   On: 07/05/2022 02:40      IMPRESSION AND PLAN:  Assessment and Plan: * Hypoglycemia - The patient will be admitted  to a medical telemetry observation bed. - We will hold off Sulfalar therapy and oral antidiabetics for now. - We will continue her on IV D10W.  COVID-19 virus infection - We will place the patient on p.o. Paxlovid. - Will follow inflammatory markers. - He will be on vitamin C and zinc sulfate.  Dyslipidemia - We will continue statin therapy.  Essential hypertension - We will continue his antihypertensives.  Type 2 diabetes mellitus with peripheral neuropathy (HCC) - The patient will be placed on supplement coverage with NovoLog. - We will hold off antidiabetic oral therapy. - We will continue Neurontin.   DVT prophylaxis: Lovenox.  Advanced Care Planning:  Code  Status: full code.  Family Communication:  The plan of care was discussed in details with the patient (and family). I answered all questions. The patient agreed to proceed with the above mentioned plan. Further management will depend upon hospital course. Disposition Plan: Back to previous home environment Consults called: none.  All the records are reviewed and case discussed with ED provider.  Status is: Observation  I certify that at the time of admission, it is my clinical judgment that the patient will require hospital care extending less than 2 midnights.                            Dispo: The patient is from: Home              Anticipated d/c is to: Home              Patient currently is not medically stable to d/c.              Difficult to place patient: No  Christel Mormon M.D on 07/05/2022 at 5:18 AM  [facility]   From 7 PM-7 AM, contact night-coverage [email or web address]  CC: Primary care physician; System, Provider Not In

## 2022-07-05 NOTE — Assessment & Plan Note (Signed)
-  The patient will be placed on supplement coverage with NovoLog. - We will hold off antidiabetic oral therapy. - We will continue Neurontin.

## 2022-07-05 NOTE — Assessment & Plan Note (Signed)
-  We will continue his antihypertensives. 

## 2022-07-05 NOTE — Assessment & Plan Note (Signed)
-  The patient will be admitted to a medical telemetry observation bed. - We will hold off Sulfalar therapy and oral antidiabetics for now. - We will continue her on IV D10W.

## 2022-07-05 NOTE — ED Provider Notes (Signed)
[facility] Provider Note    Event Date/Time   First MD Initiated Contact with Patient 07/05/22 0202     (approximate)   History   Hypoglycemia   HPI  Daniel Powell is a 67 y.o. male who presents to the ED from home with a chief complaint of hypoglycemia.  Patient is a non-insulin-dependent diabetic who began to have low blood sugars reading yesterday in the 40s and 50s.  Patient ate chocolate prior to arrival and blood sugar went up to the 180s but then was given his diabetes medication.  Endorses nausea, dizziness and decreased PO appetite.  Denies fever, chest pain, shortness of breath, abdominal pain, vomiting or diarrhea.     Past Medical History   Past Medical History:  Diagnosis Date   Diabetes mellitus without complication ([place])    Hypertension      Active Problem List   Patient Active Problem List   Diagnosis Date Noted   Hypoglycemia 07/05/2022   Cholelithiasis without cholecystitis 01/24/2018   Hepatic steatosis 01/24/2018   Right inguinal hernia 01/24/2018   HTN (hypertension), benign 04/03/2017   Diabetes mellitus type 2, uncontrolled 03/01/2015   Dyslipidemia 03/01/2015   Back pain, thoracic 03/01/2015   Persistent headaches 03/01/2015   Fracture of lumbar spine ([place]) 11/05/2013     Past Surgical History   Past Surgical History:  Procedure Laterality Date   BACK SURGERY  2014   COLONOSCOPY WITH PROPOFOL N/A 03/11/2018   Procedure: COLONOSCOPY WITH PROPOFOL;  Surgeon: Jonathon Bellows, MD;  Location: [facility] ENDOSCOPY;  Service: Gastroenterology;  Laterality: N/A;   HERNIA REPAIR       Home Medications   Prior to Admission medications   Medication Sig Start Date End Date Taking? Authorizing Provider  ACCU-CHEK GUIDE test strip USE UP TO 4 TIMES DAILY AS DIRECTED 11/07/20   Steele Sizer, MD  Accu-Chek Softclix Lancets lancets SMARTSIG:Topical 1 to 4 Times Daily Patient not taking: Reported on 11/07/2020 01/20/20   [provider]  albuterol (VENTOLIN HFA) 108 (90 Base) MCG/ACT inhaler Inhale 2 puffs into the lungs every 4 (four) hours as needed for wheezing or shortness of breath. 09/20/21   Cuthriell, Charline Bills, PA-C  atorvastatin (LIPITOR) 40 MG tablet Take 1 tablet (40 mg total) by mouth daily. 09/20/21   Cuthriell, Charline Bills, PA-C  benzonatate (TESSALON PERLES) 100 MG capsule Take 1 capsule (100 mg total) by mouth 3 (three) times daily as needed for cough. 09/20/21 09/20/22  Cuthriell, Charline Bills, PA-C  blood glucose meter kit and supplies Dispense based on patient and insurance preference. Use up to four times daily as directed. (FOR ICD-10 E10.9, E11.9). Patient not taking: Reported on 11/07/2020 01/20/20   Steele Sizer, MD  Blood Glucose Monitoring Suppl (ACCU-CHEK AVIVA PLUS) w/Device KIT 1 kit by Does not apply route daily. Patient not taking: Reported on 11/07/2020 01/21/18   Steele Sizer, MD  empagliflozin (JARDIANCE) 25 MG TABS tablet Take 1 tablet (25 mg total) by mouth daily. 09/20/21   Cuthriell, Charline Bills, PA-C  gabapentin (NEURONTIN) 300 MG capsule Take 1 capsule (300 mg total) by mouth 2 (two) times daily. 11/07/20   Kathrine Haddock, NP  glimepiride (AMARYL) 4 MG tablet Take 1 tablet (4 mg total) by mouth daily before breakfast. 11/07/20   Kathrine Haddock, NP  metFORMIN (GLUCOPHAGE) 1000 MG tablet Take 1 tablet (1,000 mg total) by mouth 2 (two) times daily with a meal. 11/07/20   Kathrine Haddock, NP  omega-3 acid  ethyl esters (LOVAZA) 1 g capsule Take 2 capsules (2 g total) by mouth 2 (two) times daily. 01/20/20   Steele Sizer, MD  predniSONE (DELTASONE) 50 MG tablet Take 1 tablet (50 mg total) by mouth daily with breakfast. 09/20/21   Cuthriell, Charline Bills, PA-C  sitaGLIPtin (JANUVIA) 100 MG tablet Take 1 tablet (100 mg total) by mouth daily. 09/20/21   Cuthriell, Charline Bills, PA-C  traZODone (DESYREL) 50 MG tablet Take 0.5-1 tablets (25-50 mg total) by mouth at bedtime as needed for sleep. 01/29/20    Steele Sizer, MD  valsartan-hydrochlorothiazide (DIOVAN HCT) 320-25 MG tablet Take 1 tablet by mouth daily. 09/20/21   Cuthriell, Charline Bills, PA-C     Allergies  Gadolinium derivatives   Family History   Family History  Problem Relation Age of Onset   Heart attack Mother    Diabetes Mother    Hypertension Mother    Heart disease Father    Diabetes Father    Hypertension Father    Diabetes Sister    Hypertension Sister    Diabetes Son    Diabetes Son    Hip dysplasia Daughter      Physical Exam  Triage Vital Signs: ED Triage Vitals  Enc Vitals Group     BP 07/04/22 2033 (!) 129/53     Pulse Rate 07/04/22 2033 90     Resp 07/04/22 2033 (!) 22     Temp 07/04/22 2033 99.7 F (37.6 C)     Temp Source 07/04/22 2033 Oral     SpO2 07/04/22 2033 95 %     Weight --      Height 07/04/22 2034 '5\' 7"'$  (1.702 m)     Head Circumference --      Peak Flow --      Pain Score 07/04/22 2033 10     Pain Loc --      Pain Edu? --      Excl. in [place]? --     Updated Vital Signs: BP (!) 131/57 (BP Location: Left Arm)   Pulse 90   Temp (!) 101.4 F (38.6 C) (Oral)   Resp 18   Ht '5\' 7"'$  (1.702 m)   SpO2 95%   BMI 20.72 kg/m    General: Awake, no distress.  Appears weak. CV:  RRR.  Good peripheral perfusion.  Resp:  Normal effort.  Slightly diminished otherwise CTAB. Abd:  Nontender.  No distention.  Other:  Bilateral calves are supple and nontender.   ED Results / Procedures / Treatments  Labs (all labs ordered are listed, but only abnormal results are displayed) Labs Reviewed  RESP PANEL BY RT-PCR (RSV, FLU A&B, COVID)  RVPGX2 - Abnormal; Notable for the following components:      Result Value   SARS Coronavirus 2 by RT PCR POSITIVE (*)    All other components within normal limits  BASIC METABOLIC PANEL - Abnormal; Notable for the following components:   Glucose, Bld 57 (*)    All other components within normal limits  CBC - Abnormal; Notable for the following  components:   Hemoglobin 12.8 (*)    All other components within normal limits  URINALYSIS, ROUTINE W REFLEX MICROSCOPIC - Abnormal; Notable for the following components:   Color, Urine YELLOW (*)    APPearance HAZY (*)    Glucose, UA >=500 (*)    Protein, ur 100 (*)    All other components within normal limits  CBG MONITORING, ED - Abnormal; Notable for the  following components:   Glucose-Capillary 48 (*)    All other components within normal limits  CBG MONITORING, ED - Abnormal; Notable for the following components:   Glucose-Capillary 51 (*)    All other components within normal limits  CBG MONITORING, ED - Abnormal; Notable for the following components:   Glucose-Capillary 126 (*)    All other components within normal limits  CBG MONITORING, ED - Abnormal; Notable for the following components:   Glucose-Capillary 50 (*)    All other components within normal limits  CBG MONITORING, ED - Abnormal; Notable for the following components:   Glucose-Capillary 134 (*)    All other components within normal limits  LACTATE DEHYDROGENASE  FERRITIN  HIV ANTIBODY (ROUTINE TESTING W REFLEX)  BRAIN NATRIURETIC PEPTIDE  C-REACTIVE PROTEIN  D-DIMER, QUANTITATIVE  FIBRINOGEN  HEPATITIS B SURFACE ANTIGEN  PROCALCITONIN  CBG MONITORING, ED  CBG MONITORING, ED  TYPE AND SCREEN  ABO/RH  TROPONIN I (HIGH SENSITIVITY)     EKG  ED ECG REPORT I, Taylan Marez J, the attending physician, personally viewed and interpreted this ECG.   Date: 07/05/2022  EKG Time: 0310  Rate: 84  Rhythm: normal sinus rhythm  Axis: Normal  Intervals:none  ST&T Change: Nonspecific    RADIOLOGY I have independently visualized and interpreted patient's chest x-ray as well as noted the radiology interpretation:  X-ray: No acute cardiopulmonary process  Official radiology report(s): DG Chest Port 1 View  Result Date: 07/05/2022 CLINICAL DATA:  COVID positive EXAM: PORTABLE CHEST 1 VIEW COMPARISON:   01/06/2020 FINDINGS: The heart size and mediastinal contours are upper limits of normal. Low lung volumes. Both lungs are clear. The visualized skeletal structures are unremarkable. IMPRESSION: No active disease. Electronically Signed   By: Placido Sou M.D.   On: 07/05/2022 02:40     PROCEDURES:  Critical Care performed: Yes, see critical care procedure note(s)  CRITICAL CARE Performed by: Paulette Blanch   Total critical care time: 30 minutes  Critical care time was exclusive of separately billable procedures and treating other patients.  Critical care was necessary to treat or prevent imminent or life-threatening deterioration.  Critical care was time spent personally by me on the following activities: development of treatment plan with patient and/or surrogate as well as nursing, discussions with consultants, evaluation of patient's response to treatment, examination of patient, obtaining history from patient or surrogate, ordering and performing treatments and interventions, ordering and review of laboratory studies, ordering and review of radiographic studies, pulse oximetry and re-evaluation of patient's condition.   Marland Kitchen1-3 Lead EKG Interpretation  Performed by: Paulette Blanch, MD Authorized by: Paulette Blanch, MD     Interpretation: normal     ECG rate:  90   ECG rate assessment: normal     Rhythm: sinus rhythm     Ectopy: none     Conduction: normal   Comments:     Patient placed on cardiac monitor to evaluate for arrhythmias    MEDICATIONS ORDERED IN ED: Medications  dextrose 10 % and 0.45 % NaCl infusion (has no administration in time range)  methylPREDNISolone sodium succinate (SOLU-MEDROL) injection 1 mg/kg (has no administration in time range)    Followed by  predniSONE (DELTASONE) tablet 50 mg (has no administration in time range)  nirmatrelvir/ritonavir (PAXLOVID) 3 tablet (has no administration in time range)  dextrose 50 % solution 50 mL ( Intravenous Not Given  07/05/22 0121)  dextrose 50 % solution 50 mL (50 mLs Intravenous Given 07/04/22 2340)  acetaminophen (TYLENOL) tablet 650 mg (650 mg Oral Given 07/05/22 0120)     IMPRESSION / MDM / ASSESSMENT AND PLAN / ED COURSE  I reviewed the triage vital signs and the nursing notes.                             67 year old male presenting with hypoglycemia.  Differential diagnosis includes but is not limited to ACS, infectious, metabolic etiologies, etc.  I have personally reviewed patient's records and note last provider visit was in the ED on 09/20/2021 for bronchitis.  Patient's presentation is most consistent with acute presentation with potential threat to life or bodily function.  The patient is on the cardiac monitor to evaluate for evidence of arrhythmia and/or significant heart rate changes.  Laboratory results demonstrate normal WBC 6.3, unremarkable urinalysis.  Patient is positive for COVID-19.  Blood sugar 48 in triage, given orange juice.  Subsequent blood sugar 51, given amp of D50 which brought it up to 126.  Next blood sugar check was 50, given second amp D50 which brought it up to 134.  Will check current blood sugar.  Clinical Course as of 07/05/22 0311  Ludwig Clarks Jul 05, 2022  0223 Repeat blood sugar 84, down from 134 1 hour ago.  Will initiate D10 drip and consult hospitalist services for evaluation and admission. [JS]    Clinical Course User Index [JS] Paulette Blanch, MD     FINAL CLINICAL IMPRESSION(S) / ED DIAGNOSES   Final diagnoses:  Hypoglycemia  COVID-19  Fever, unspecified fever cause     Rx / DC Orders   ED Discharge Orders     None        Note:  This document was prepared using Dragon voice recognition software and may include unintentional dictation errors.   Paulette Blanch, MD 07/05/22 [phone]

## 2022-07-05 NOTE — Progress Notes (Addendum)
  Progress Note   Patient: Daniel Powell [id number]:[id number] DOB: 1955/10/20 DOA: 07/05/2022     0 DOS: the patient was seen and examined on 07/05/2022   Brief hospital course: Daniel Powell is a 67 y.o. male with medical history significant for type 2 diabetes mellitus and hypertension, who presented to the emergency room with acute onset of dizziness with associated hypoglycemia.  He has been having dry cough with associated dyspnea and wheezing over the last few days.  He was found to have COVID-positive, treated with steroids, Paxlovid.  The patient does not have hypoxemia. Patient is also found to have hypoglycemia, oral diabetic medicine was on hold, patient was started on D10, glucose is better afterwards.  Assessment and Plan: *Type 2 diabetes with hypoglycemia. Glucose much better after given D10 as well as steroids.  Continue sliding scale insulin for now as glucose started running high.  Continue hold oral diabetic medicines.  COVID-19 virus infection No hypoxemia, currently on Paxlovid and steroids, will decrease dose to Decadron.  Dyslipidemia Continue home treatment.  Essential hypertension Continue home medicines.  Chest pain. D-dimer not elevated corrected for age, troponin negative.  Chest pain appears to be secondary to chest wall pain due to cough.  Continue to monitor.    Subjective:  Patient still complaining some short of breath with excursion, he also complaining of chest pain, upper chest, persistent, worse with cough.  No  fever or chills.  Physical Exam: Vitals:   07/05/22 0900 07/05/22 0943 07/05/22 0945 07/05/22 1025  BP: 131/60  139/64 129/64  Pulse: 84  86 84  Resp:    19  Temp:  99.5 F (37.5 C)  99.3 F (37.4 C)  TempSrc:  Oral    SpO2: (!) 89% 94% 91% 96%  Weight:      Height:       General exam: Appears calm and comfortable  Respiratory system: Clear to auscultation. Respiratory effort normal. Cardiovascular system: S1 & S2 heard, RRR. No JVD,  murmurs, rubs, gallops or clicks. No pedal edema. Gastrointestinal system: Abdomen is nondistended, soft and nontender. No organomegaly or masses felt. Normal bowel sounds heard. Central nervous system: Alert and oriented. No focal neurological deficits. Extremities: Symmetric 5 x 5 power. Skin: No rashes, lesions or ulcers Psychiatry: Judgement and insight appear normal. Mood & affect appropriate.   Data Reviewed:  Lab results, chest x-ray reviewed.  Family Communication: Family at bedside, updated.  Disposition: Status is: Observation   Planned Discharge Destination: Home    Time spent: no charge minutes  Author: Sharen Hones, MD 07/05/2022 12:36 PM  For on call review [email or web address].

## 2022-07-05 NOTE — ED Notes (Signed)
Pt reporting chest pain with and without coughing - obtained new EKG, see MUSE.  Pt given cough medication per [facility] - notified Dr. Roosevelt Locks. See new orders.

## 2022-07-05 NOTE — ED Notes (Addendum)
Text paged Dr. Roosevelt Locks - notified of BG 204 with d10 running.   Awaiting call back.  Response - dc d10, do not give insulin, check glucose in 2 hours to make sure stable.

## 2022-07-05 NOTE — Hospital Course (Signed)
Daniel Powell is a 67 y.o. male with medical history significant for type 2 diabetes mellitus and hypertension, who presented to the emergency room with acute onset of dizziness with associated hypoglycemia.  He has been having dry cough with associated dyspnea and wheezing over the last few days.  He was found to have COVID-positive, treated with steroids, Paxlovid.  The patient does not have hypoxemia. Patient is also found to have hypoglycemia, oral diabetic medicine was on hold, patient was started on D10, glucose is better afterwards.

## 2022-07-05 NOTE — Assessment & Plan Note (Signed)
-  We will continue statin therapy. 

## 2022-07-06 ENCOUNTER — Observation Stay: Payer: Medicaid Other

## 2022-07-06 DIAGNOSIS — U071 COVID-19: Secondary | ICD-10-CM | POA: Diagnosis not present

## 2022-07-06 DIAGNOSIS — E162 Hypoglycemia, unspecified: Secondary | ICD-10-CM | POA: Diagnosis not present

## 2022-07-06 DIAGNOSIS — E1142 Type 2 diabetes mellitus with diabetic polyneuropathy: Secondary | ICD-10-CM | POA: Diagnosis not present

## 2022-07-06 LAB — MAGNESIUM: Magnesium: 2.1 mg/dL (ref 1.7–2.4)

## 2022-07-06 LAB — COMPREHENSIVE METABOLIC PANEL
ALT: 18 U/L (ref 0–44)
AST: 20 U/L (ref 15–41)
Albumin: 3.7 g/dL (ref 3.5–5.0)
Alkaline Phosphatase: 32 U/L — ABNORMAL LOW (ref 38–126)
Anion gap: 9 (ref 5–15)
BUN: 31 mg/dL — ABNORMAL HIGH (ref 8–23)
CO2: 25 mmol/L (ref 22–32)
Calcium: 8.8 mg/dL — ABNORMAL LOW (ref 8.9–10.3)
Chloride: 103 mmol/L (ref 98–111)
Creatinine, Ser: 1.03 mg/dL (ref 0.61–1.24)
GFR, Estimated: >60 mL/min (ref >60)
Glucose, Bld: 211 mg/dL — ABNORMAL HIGH (ref 70–99)
Potassium: 4.3 mmol/L (ref 3.5–5.1)
Sodium: 137 mmol/L (ref 135–145)
Total Bilirubin: 1 mg/dL (ref 0.3–1.2)
Total Protein: 7.1 g/dL (ref 6.5–8.1)

## 2022-07-06 LAB — CBC WITH DIFFERENTIAL/PLATELET
Abs Immature Granulocytes: 0.02 10*3/uL (ref 0.00–0.07)
Basophils Absolute: 0 10*3/uL (ref 0.0–0.1)
Basophils Relative: 0 %
Eosinophils Absolute: 0 10*3/uL (ref 0.0–0.5)
Eosinophils Relative: 0 %
HCT: 36.1 % — ABNORMAL LOW (ref 39.0–52.0)
Hemoglobin: 11.9 g/dL — ABNORMAL LOW (ref 13.0–17.0)
Immature Granulocytes: 0 %
Lymphocytes Relative: 13 %
Lymphs Abs: 0.7 10*3/uL (ref 0.7–4.0)
MCH: 29.2 pg (ref 26.0–34.0)
MCHC: 33 g/dL (ref 30.0–36.0)
MCV: 88.7 fL (ref 80.0–100.0)
Monocytes Absolute: 0.7 10*3/uL (ref 0.1–1.0)
Monocytes Relative: 13 %
Neutro Abs: 4 10*3/uL (ref 1.7–7.7)
Neutrophils Relative %: 74 %
Platelets: 148 10*3/uL — ABNORMAL LOW (ref 150–400)
RBC: 4.07 MIL/uL — ABNORMAL LOW (ref 4.22–5.81)
RDW: 14.9 % (ref 11.5–15.5)
WBC: 5.4 10*3/uL (ref 4.0–10.5)
nRBC: 0 % (ref 0.0–0.2)

## 2022-07-06 LAB — GLUCOSE, CAPILLARY
Glucose-Capillary: 180 mg/dL — ABNORMAL HIGH (ref 70–99)
Glucose-Capillary: 265 mg/dL — ABNORMAL HIGH (ref 70–99)
Glucose-Capillary: 247 mg/dL — ABNORMAL HIGH (ref 70–99)
Glucose-Capillary: 265 mg/dL — ABNORMAL HIGH (ref 70–99)

## 2022-07-06 LAB — HEMOGLOBIN A1C
Hgb A1c MFr Bld: 7.2 % — ABNORMAL HIGH (ref 4.8–5.6)
Mean Plasma Glucose: 160 mg/dL

## 2022-07-06 LAB — FERRITIN: Ferritin: 109 ng/mL (ref 24–336)

## 2022-07-06 LAB — C-REACTIVE PROTEIN: CRP: 1.4 mg/dL — ABNORMAL HIGH (ref <1.0)

## 2022-07-06 MED ORDER — ALUM & MAG HYDROXIDE-SIMETH 200-200-20 MG/5ML PO SUSP
30.0000 mL | ORAL | Status: DC | PRN
  Administered 2022-07-06: 30 mL via ORAL
  Filled 2022-07-06: qty 30

## 2022-07-06 MED ORDER — INSULIN GLARGINE-YFGN 100 UNIT/ML ~~LOC~~ SOLN
16.0000 [IU] | Freq: Every day | SUBCUTANEOUS | Status: DC
  Administered 2022-07-06 – 2022-07-07 (×2): 16 [IU] via SUBCUTANEOUS
  Filled 2022-07-06 (×2): qty 0.16

## 2022-07-06 MED ORDER — PANTOPRAZOLE SODIUM 40 MG PO TBEC
40.0000 mg | DELAYED_RELEASE_TABLET | Freq: Every day | ORAL | Status: DC
  Administered 2022-07-06 – 2022-07-07 (×2): 40 mg via ORAL
  Filled 2022-07-06 (×2): qty 1

## 2022-07-06 MED ORDER — OXYCODONE-ACETAMINOPHEN 5-325 MG PO TABS
1.0000 | ORAL_TABLET | Freq: Four times a day (QID) | ORAL | Status: DC | PRN
  Administered 2022-07-06: 1 via ORAL
  Filled 2022-07-06: qty 1

## 2022-07-06 NOTE — Progress Notes (Signed)
  Progress Note   Patient: Daniel Powell [id number] DOB: September 21, 1955 DOA: 07/05/2022     0 DOS: the patient was seen and examined on 07/06/2022   Brief hospital course: Daniel Powell is a 67 y.o. male with medical history significant for type 2 diabetes mellitus and hypertension, who presented to the emergency room with acute onset of dizziness with associated hypoglycemia.  He has been having dry cough with associated dyspnea and wheezing over the last few days.  He was found to have COVID-positive, treated with steroids, Paxlovid.  The patient does not have hypoxemia. Patient is also found to have hypoglycemia, oral diabetic medicine was on hold, patient was started on D10, glucose is better afterwards.  Assessment and Plan:  *Type 2 diabetes with hypoglycemia. Hypoglycemia has resolved, glucose running high again.  Start insulin glargine in addition to sliding scale insulin.  A1c 7.2.   COVID-19 virus infection No hypoxemia, currently on Paxlovid and steroids. Patient still complaining of shortness of breath, not baseline lung disease.  Will repeat chest x-ray.  Continue dexamethasone. Patient is not comfortable to go home today, planning to discharge home tomorrow   Dyslipidemia Continue home treatment.   Essential hypertension Continue home medicines.   Chest pain. D-dimer not elevated corrected for age, troponin negative.  Chest pain appears to be secondary to chest wall pain due to cough.   Patient now complaining of upper stomach pain, chest pain resolved.  Protonix added.     Subjective:  Patient still complains of shortness of breath, minimal cough.  Epigastric pain, no nausea vomiting.  Physical Exam: Vitals:   07/05/22 1659 07/05/22 2214 07/06/22 0655 07/06/22 0816  BP: (!) 113/52 (!) 132/51 127/61 131/62  Pulse: 70 69 62 (!) 57  Resp: '17 18 18 18  '$ Temp: 98.5 F (36.9 C) 97.9 F (36.6 C) 98.4 F (36.9 C) 98 F (36.7 C)  TempSrc:  Oral Oral   SpO2: 96% 96% 98%  95%  Weight:      Height:       General exam: Appears calm and comfortable  Respiratory system: Clear to auscultation. Respiratory effort normal. Cardiovascular system: S1 & S2 heard, RRR. No JVD, murmurs, rubs, gallops or clicks. No pedal edema. Gastrointestinal system: Abdomen is nondistended, soft and nontender. No organomegaly or masses felt. Normal bowel sounds heard. Central nervous system: Alert and oriented. No focal neurological deficits. Extremities: Symmetric 5 x 5 power. Skin: No rashes, lesions or ulcers Psychiatry: Judgement and insight appear normal. Mood & affect appropriate.   Data Reviewed:  Lab results reviewed.  Family Communication: none  Disposition: Status is: Observation   Planned Discharge Destination: Home    Time spent: 35 minutes  Author: Sharen Hones, MD 07/06/2022 10:38 AM  For on call review [email or web address].

## 2022-07-06 NOTE — Progress Notes (Signed)
Medication administration performed. Translation services provided via I-pad. All questions and concerns addressed. Translator ID #[id number]

## 2022-07-06 NOTE — Progress Notes (Signed)
Shift assessment and medication administration performed. Translator services used via I-pad translation services. Translator IT#[id number]. All concerns addressed at time. Pt present no questions, expressed concern over cough. Tussionex PRN administered.

## 2022-07-07 DIAGNOSIS — D696 Thrombocytopenia, unspecified: Secondary | ICD-10-CM | POA: Insufficient documentation

## 2022-07-07 DIAGNOSIS — E162 Hypoglycemia, unspecified: Secondary | ICD-10-CM | POA: Diagnosis not present

## 2022-07-07 DIAGNOSIS — E1142 Type 2 diabetes mellitus with diabetic polyneuropathy: Secondary | ICD-10-CM | POA: Diagnosis not present

## 2022-07-07 DIAGNOSIS — U071 COVID-19: Secondary | ICD-10-CM | POA: Diagnosis not present

## 2022-07-07 LAB — CBC WITH DIFFERENTIAL/PLATELET
Abs Immature Granulocytes: 0.02 10*3/uL (ref 0.00–0.07)
Basophils Absolute: 0 10*3/uL (ref 0.0–0.1)
Basophils Relative: 1 %
Eosinophils Absolute: 0 10*3/uL (ref 0.0–0.5)
Eosinophils Relative: 1 %
HCT: 37 % — ABNORMAL LOW (ref 39.0–52.0)
Hemoglobin: 12 g/dL — ABNORMAL LOW (ref 13.0–17.0)
Immature Granulocytes: 1 %
Lymphocytes Relative: 28 %
Lymphs Abs: 1.1 10*3/uL (ref 0.7–4.0)
MCH: 29.1 pg (ref 26.0–34.0)
MCHC: 32.4 g/dL (ref 30.0–36.0)
MCV: 89.8 fL (ref 80.0–100.0)
Monocytes Absolute: 0.4 10*3/uL (ref 0.1–1.0)
Monocytes Relative: 9 %
Neutro Abs: 2.4 10*3/uL (ref 1.7–7.7)
Neutrophils Relative %: 60 %
Platelets: 161 10*3/uL (ref 150–400)
RBC: 4.12 MIL/uL — ABNORMAL LOW (ref 4.22–5.81)
RDW: 15.4 % (ref 11.5–15.5)
WBC: 4 10*3/uL (ref 4.0–10.5)
nRBC: 0 % (ref 0.0–0.2)

## 2022-07-07 LAB — COMPREHENSIVE METABOLIC PANEL
ALT: 18 U/L (ref 0–44)
AST: 19 U/L (ref 15–41)
Albumin: 3.6 g/dL (ref 3.5–5.0)
Alkaline Phosphatase: 35 U/L — ABNORMAL LOW (ref 38–126)
Anion gap: 10 (ref 5–15)
BUN: 40 mg/dL — ABNORMAL HIGH (ref 8–23)
CO2: 24 mmol/L (ref 22–32)
Calcium: 8.5 mg/dL — ABNORMAL LOW (ref 8.9–10.3)
Chloride: 101 mmol/L (ref 98–111)
Creatinine, Ser: 1.06 mg/dL (ref 0.61–1.24)
GFR, Estimated: >60 mL/min (ref >60)
Glucose, Bld: 289 mg/dL — ABNORMAL HIGH (ref 70–99)
Potassium: 4.1 mmol/L (ref 3.5–5.1)
Sodium: 135 mmol/L (ref 135–145)
Total Bilirubin: 0.8 mg/dL (ref 0.3–1.2)
Total Protein: 6.9 g/dL (ref 6.5–8.1)

## 2022-07-07 LAB — FERRITIN: Ferritin: 110 ng/mL (ref 24–336)

## 2022-07-07 LAB — GLUCOSE, CAPILLARY: Glucose-Capillary: 221 mg/dL — ABNORMAL HIGH (ref 70–99)

## 2022-07-07 LAB — C-REACTIVE PROTEIN: CRP: 0.7 mg/dL (ref <1.0)

## 2022-07-07 MED ORDER — NIRMATRELVIR/RITONAVIR (PAXLOVID)TABLET: 3.0000 | ORAL_TABLET | Freq: Two times a day (BID) | ORAL | 0 refills | Status: AC

## 2022-07-07 MED ORDER — LACTULOSE 20 G PO PACK: 20.0000 g | PACK | Freq: Every day | ORAL | 0 refills | Status: AC | PRN

## 2022-07-07 MED ORDER — GLIMEPIRIDE 4 MG PO TABS: 2.0000 mg | ORAL_TABLET | Freq: Every day | ORAL | 1 refills | Status: DC

## 2022-07-07 MED ORDER — ACETAMINOPHEN 325 MG PO TABS: 650.0000 mg | ORAL_TABLET | Freq: Four times a day (QID) | ORAL | 0 refills | Status: DC | PRN

## 2022-07-07 MED ORDER — PANTOPRAZOLE SODIUM 40 MG PO TBEC: 40.0000 mg | DELAYED_RELEASE_TABLET | Freq: Every day | ORAL | 0 refills | Status: AC

## 2022-07-07 MED ORDER — SENNOSIDES-DOCUSATE SODIUM 8.6-50 MG PO TABS: 2.0000 | ORAL_TABLET | Freq: Two times a day (BID) | ORAL | 0 refills | Status: DC

## 2022-07-07 MED ORDER — ALBUTEROL SULFATE HFA 108 (90 BASE) MCG/ACT IN AERS: 2.0000 | INHALATION_SPRAY | RESPIRATORY_TRACT | 0 refills | Status: DC | PRN

## 2022-07-07 NOTE — Discharge Instructions (Addendum)
Follow-up with PCP in 1 week. You are contagious for 10 days from the diagnosis, please wear masks. Finish The remaining dose of Paxlovid. Call your family doctor or come to the emergency room if you develop significant short of breath or high fever. Do not take your Lipitor while taking Paxlovid.  May resume after completion of Paxlovid.

## 2022-07-07 NOTE — Discharge Summary (Signed)
Physician Discharge Summary   Patient: Daniel Powell MRN: [id number] DOB: 03/24/1956  Admit date:     07/05/2022  Discharge date: 07/07/22  Discharge Physician: Sharen Hones   PCP: System, Provider Not In   Recommendations at discharge:    Follow-up with PCP in 1 week. You are contagious for 10 days from the diagnosis, please wear masks. Finish The remaining dose of Paxlovid. Call your family doctor or come to the emergency room if you develop significant short of breath or high fever. Do not take your Lipitor while taking Paxlovid.  May resume after completion of Paxlovid.  Discharge Diagnoses: Principal Problem:   Hypoglycemia Active Problems:   COVID-19 virus infection   Dyslipidemia   Essential hypertension   Type 2 diabetes mellitus with peripheral neuropathy (HCC)   COVID-19   Thrombocytopenia (HCC)  Resolved Problems:   * No resolved hospital problems. [facility] Course: Daniel Powell is a 67 y.o. male with medical history significant for type 2 diabetes mellitus and hypertension, who presented to the emergency room with acute onset of dizziness with associated hypoglycemia.  He has been having dry cough with associated dyspnea and wheezing over the last few days.  He was found to have COVID-positive, treated with steroids, Paxlovid.  The patient does not have hypoxemia. Patient is also found to have hypoglycemia, oral diabetic medicine was on hold, patient was started on D10, glucose is better afterwards.  Assessment and Plan: *Type 2 diabetes with hypoglycemia. Hypoglycemia has resolved, he was put on insulin glargine and sliding insulin.  Glucose still running high, A1c 7.2, will resume home diabetic treatment.  He was taking 4 different diabetic medications at home, looks like doing well with A1c.  Due to recent hypoglycemia, I we will cut down glimepiride to 2 mg daily. Follow-up with PCP in 1 week.   COVID-19 virus infection No hypoxemia, currently on Paxlovid and  steroids. Repeated chest x-ray yesterday still did not show any active disease.  No fever or chills, currently medically stable to be discharged.  I will refill his inhalers. He received steroids in the hospital, due to no hypoxemia, no need for additional steroids.   Dyslipidemia Continue home treatment.   Essential hypertension Continue home medicines.   Chest pain. D-dimer not elevated corrected for age, troponin negative.  Chest pain appears to be secondary to chest wall pain due to cough.   Patient now complaining of upper stomach pain, chest pain resolved.  Protonix added.      Consultants: None Procedures performed: None  Disposition: Home Diet recommendation:  Discharge Diet Orders (From admission, onward)     Start     Ordered   07/07/22 0000  Diet - low sodium heart healthy        07/07/22 1015           Cardiac diet DISCHARGE MEDICATION: Allergies as of 07/07/2022       Reactions   Gadolinium Derivatives Itching   Pt began itching across ribs and back and neck s/p Multihance injection        Medication List     STOP taking these medications    atorvastatin 40 MG tablet Commonly known as: LIPITOR       TAKE these medications    acetaminophen 325 MG tablet Commonly known as: TYLENOL Take 2 tablets (650 mg total) by mouth every 6 (six) hours as needed for mild pain or headache (fever >/= 101).   albuterol 108 (90 Base) MCG/ACT  inhaler Commonly known as: VENTOLIN HFA Inhale 2 puffs into the lungs every 4 (four) hours as needed for wheezing or shortness of breath.   allopurinol 300 MG tablet Commonly known as: ZYLOPRIM Take 300 mg by mouth daily.   empagliflozin 25 MG Tabs tablet Commonly known as: Jardiance Take 1 tablet (25 mg total) by mouth daily.   glimepiride 4 MG tablet Commonly known as: AMARYL Take 0.5 tablets (2 mg total) by mouth daily before breakfast. What changed: how much to take   lactulose 20 g packet Commonly known  as: CEPHULAC Take 1 packet (20 g total) by mouth daily as needed (constipation).   metFORMIN 1000 MG tablet Commonly known as: GLUCOPHAGE Take 1 tablet (1,000 mg total) by mouth 2 (two) times daily with a meal.   nirmatrelvir/ritonavir 20 x 150 MG & 10 x '100MG'$  Tabs Commonly known as: PAXLOVID Take 3 tablets by mouth 2 (two) times daily for 5 days. Complete the remaining doses   pantoprazole 40 MG tablet Commonly known as: PROTONIX Take 1 tablet (40 mg total) by mouth daily. Start taking on: July 08, 2022   pioglitazone 45 MG tablet Commonly known as: ACTOS Take 45 mg by mouth every morning.   senna-docusate 8.6-50 MG tablet Commonly known as: Senokot-S Take 2 tablets by mouth 2 (two) times daily. Hold this medicine if loose stools   sitaGLIPtin 100 MG tablet Commonly known as: Januvia Take 1 tablet (100 mg total) by mouth daily.   traZODone 50 MG tablet Commonly known as: DESYREL Take 0.5-1 tablets (25-50 mg total) by mouth at bedtime as needed for sleep.   valsartan-hydrochlorothiazide 320-12.5 MG tablet Commonly known as: DIOVAN-HCT Take 1 tablet by mouth daily.        Discharge Exam: Filed Weights   07/05/22 0318  Weight: 72 kg   General exam: Appears calm and comfortable  Respiratory system: Clear to auscultation. Respiratory effort normal. Cardiovascular system: S1 & S2 heard, RRR. No JVD, murmurs, rubs, gallops or clicks. No pedal edema. Gastrointestinal system: Abdomen is nondistended, soft and nontender. No organomegaly or masses felt. Normal bowel sounds heard. Central nervous system: Alert and oriented. No focal neurological deficits. Extremities: Symmetric 5 x 5 power. Skin: No rashes, lesions or ulcers Psychiatry: Judgement and insight appear normal. Mood & affect appropriate.    Condition at discharge: good  The results of significant diagnostics from this hospitalization (including imaging, microbiology, ancillary and laboratory) are listed  below for reference.   Imaging Studies: DG Chest Port 1 View  Result Date: 07/06/2022 CLINICAL DATA:  COVID-19 positive EXAM: PORTABLE CHEST 1 VIEW COMPARISON:  Chest x-ray July 05, 2022 FINDINGS: The heart size and mediastinal contours are within normal limits. Both lungs are clear. The visualized skeletal structures are unremarkable. IMPRESSION: No active disease. Electronically Signed   By: Dorise Bullion III M.D.   On: 07/06/2022 10:48   DG Chest Port 1 View  Result Date: 07/05/2022 CLINICAL DATA:  COVID positive EXAM: PORTABLE CHEST 1 VIEW COMPARISON:  01/06/2020 FINDINGS: The heart size and mediastinal contours are upper limits of normal. Low lung volumes. Both lungs are clear. The visualized skeletal structures are unremarkable. IMPRESSION: No active disease. Electronically Signed   By: Placido Sou M.D.   On: 07/05/2022 02:40    Microbiology: Results for orders placed or performed during the hospital encounter of 07/05/22  Resp panel by RT-PCR (RSV, Flu A&B, Covid) Anterior Nasal Swab     Status: Abnormal   Collection Time: 07/04/22  9:34 PM   Specimen: Anterior Nasal Swab  Result Value Ref Range Status   SARS Coronavirus 2 by RT PCR POSITIVE (A) NEGATIVE Final    Comment: (NOTE) SARS-CoV-2 target nucleic acids are DETECTED.  The SARS-CoV-2 RNA is generally detectable in upper respiratory specimens during the acute phase of infection. Positive results are indicative of the presence of the identified virus, but do not rule out bacterial infection or co-infection with other pathogens not detected by the test. Clinical correlation with patient history and other diagnostic information is necessary to determine patient infection status. The expected result is Negative.  Fact Sheet for Patients: EntrepreneurPulse.com.au  Fact Sheet for Healthcare Providers: IncredibleEmployment.be  This test is not yet approved or cleared by the [place] FDA and  has been authorized for detection and/or diagnosis of SARS-CoV-2 by FDA under an Emergency Use Authorization (EUA).  This EUA will remain in effect (meaning this test can be used) for the duration of  the COVID-19 declaration under Section 564(b)(1) of the A ct, 21 U.S.C. section 360bbb-3(b)(1), unless the authorization is terminated or revoked sooner.     Influenza A by PCR NEGATIVE NEGATIVE Final   Influenza B by PCR NEGATIVE NEGATIVE Final    Comment: (NOTE) The Xpert Xpress SARS-CoV-2/FLU/RSV plus assay is intended as an aid in the diagnosis of influenza from Nasopharyngeal swab specimens and should not be used as a sole basis for treatment. Nasal washings and aspirates are unacceptable for Xpert Xpress SARS-CoV-2/FLU/RSV testing.  Fact Sheet for Patients: EntrepreneurPulse.com.au  Fact Sheet for Healthcare Providers: IncredibleEmployment.be  This test is not yet approved or cleared by the [place] FDA and has been authorized for detection and/or diagnosis of SARS-CoV-2 by FDA under an Emergency Use Authorization (EUA). This EUA will remain in effect (meaning this test can be used) for the duration of the COVID-19 declaration under Section 564(b)(1) of the Act, 21 U.S.C. section 360bbb-3(b)(1), unless the authorization is terminated or revoked.     Resp Syncytial Virus by PCR NEGATIVE NEGATIVE Final    Comment: (NOTE) Fact Sheet for Patients: EntrepreneurPulse.com.au  Fact Sheet for Healthcare Providers: IncredibleEmployment.be  This test is not yet approved or cleared by the [place] FDA and has been authorized for detection and/or diagnosis of SARS-CoV-2 by FDA under an Emergency Use Authorization (EUA). This EUA will remain in effect (meaning this test can be used) for the duration of the COVID-19 declaration under Section 564(b)(1) of the Act, 21 U.S.C. section  360bbb-3(b)(1), unless the authorization is terminated or revoked.  Performed at [facility], [address] [place], [place] [address]     Labs: CBC: Recent Labs  Lab 07/04/22 2038 07/06/22 0643 07/07/22 0507  WBC 6.3 5.4 4.0  NEUTROABS  --  4.0 2.4  HGB 12.8* 11.9* 12.0*  HCT 40.1 36.1* 37.0*  MCV 91.3 88.7 89.8  PLT 160 148* 834   Basic Metabolic Panel: Recent Labs  Lab 07/04/22 2038 07/06/22 0643 07/07/22 0507  NA 135 137 135  K 3.9 4.3 4.1  CL 100 103 101  CO2 '28 25 24  '$ GLUCOSE 57* 211* 289*  BUN 20 31* 40*  CREATININE 1.15 1.03 1.06  CALCIUM 9.2 8.8* 8.5*  MG  --  2.1  --    Liver Function Tests: Recent Labs  Lab 07/06/22 0643 07/07/22 0507  AST 20 19  ALT 18 18  ALKPHOS 32* 35*  BILITOT 1.0 0.8  PROT 7.1 6.9  ALBUMIN 3.7 3.6  CBG: Recent Labs  Lab 07/06/22 0813 07/06/22 1233 07/06/22 1635 07/06/22 2122 07/07/22 0836  GLUCAP 180* 265* 247* 265* 221*    Discharge time spent: greater than 30 minutes.  Signed: Sharen Hones, MD [facility] 07/07/2022

## 2022-09-02 ENCOUNTER — Ambulatory Visit: Payer: Medicaid Other | Admitting: Internal Medicine

## 2023-02-07 ENCOUNTER — Other Ambulatory Visit: Payer: Self-pay | Admitting: Internal Medicine

## 2023-02-07 ENCOUNTER — Other Ambulatory Visit: Payer: Self-pay | Admitting: Family

## 2023-02-07 DIAGNOSIS — E1129 Type 2 diabetes mellitus with other diabetic kidney complication: Secondary | ICD-10-CM

## 2023-02-12 ENCOUNTER — Other Ambulatory Visit: Payer: Self-pay | Admitting: Internal Medicine

## 2023-02-12 DIAGNOSIS — E1129 Type 2 diabetes mellitus with other diabetic kidney complication: Secondary | ICD-10-CM

## 2023-02-12 MED ORDER — GLIMEPIRIDE 4 MG PO TABS: 2.0000 mg | ORAL_TABLET | Freq: Every day | ORAL | 0 refills | Status: DC

## 2023-02-12 MED ORDER — METFORMIN HCL 1000 MG PO TABS: 1000.0000 mg | ORAL_TABLET | Freq: Two times a day (BID) | ORAL | 0 refills | Status: DC

## 2023-02-18 ENCOUNTER — Other Ambulatory Visit: Payer: Self-pay | Admitting: Internal Medicine

## 2023-02-18 ENCOUNTER — Other Ambulatory Visit: Payer: Self-pay | Admitting: Family

## 2023-02-18 DIAGNOSIS — E1129 Type 2 diabetes mellitus with other diabetic kidney complication: Secondary | ICD-10-CM

## 2023-02-28 ENCOUNTER — Ambulatory Visit: Payer: Medicaid Other | Admitting: Internal Medicine

## 2023-02-28 ENCOUNTER — Encounter: Payer: Self-pay | Admitting: Internal Medicine

## 2023-02-28 VITALS — BP 120/65 | HR 81 | Ht 66.0 in | Wt 159.6 lb

## 2023-02-28 DIAGNOSIS — R809 Proteinuria, unspecified: Secondary | ICD-10-CM | POA: Diagnosis not present

## 2023-02-28 DIAGNOSIS — E1129 Type 2 diabetes mellitus with other diabetic kidney complication: Secondary | ICD-10-CM

## 2023-02-28 DIAGNOSIS — E785 Hyperlipidemia, unspecified: Secondary | ICD-10-CM

## 2023-02-28 DIAGNOSIS — I10 Essential (primary) hypertension: Secondary | ICD-10-CM

## 2023-02-28 DIAGNOSIS — E1142 Type 2 diabetes mellitus with diabetic polyneuropathy: Secondary | ICD-10-CM | POA: Diagnosis not present

## 2023-02-28 LAB — POC CREATINE & ALBUMIN,URINE
Albumin/Creatinine Ratio, Urine, POC: >300
Creatinine, POC: 50 mg/dL
Microalbumin Ur, POC: 150 mg/L

## 2023-02-28 LAB — POCT CBG (FASTING - GLUCOSE)-MANUAL ENTRY: Glucose Fasting, POC: 136 mg/dL — AB (ref 70–99)

## 2023-02-28 MED ORDER — GLIMEPIRIDE 4 MG PO TABS: 4.0000 mg | ORAL_TABLET | Freq: Two times a day (BID) | ORAL | 0 refills | Status: DC

## 2023-02-28 MED ORDER — VALSARTAN-HYDROCHLOROTHIAZIDE 320-12.5 MG PO TABS: 1.0000 | ORAL_TABLET | Freq: Every day | ORAL | 0 refills | Status: DC

## 2023-02-28 MED ORDER — SITAGLIPTIN PHOSPHATE 100 MG PO TABS: 100.0000 mg | ORAL_TABLET | Freq: Every day | ORAL | 1 refills | Status: DC

## 2023-02-28 MED ORDER — EMPAGLIFLOZIN 25 MG PO TABS: 25.0000 mg | ORAL_TABLET | Freq: Every day | ORAL | 1 refills | Status: DC

## 2023-02-28 MED ORDER — METFORMIN HCL 1000 MG PO TABS: 1000.0000 mg | ORAL_TABLET | Freq: Two times a day (BID) | ORAL | 0 refills | Status: DC

## 2023-02-28 MED ORDER — PIOGLITAZONE HCL 45 MG PO TABS: 45.0000 mg | ORAL_TABLET | Freq: Every morning | ORAL | 0 refills | Status: DC

## 2023-02-28 MED ORDER — ATORVASTATIN CALCIUM 40 MG PO TABS: 40.0000 mg | ORAL_TABLET | Freq: Every day | ORAL | 3 refills | Status: DC

## 2023-02-28 NOTE — Progress Notes (Unsigned)
Established Patient Office Visit  Subjective:  Patient ID: Daniel Powell, male    DOB: [birth year]  Age: 67 y.o. MRN: [id number]  Chief Complaint  Patient presents with   Follow-up    6 WEEK F/U with lab results    Returned from [place] 2 wks ago. No new complaints, here for lab review and medication refills. Failed to have previsit labs done. Home bg readings ranged between 180-300 fasting while overseas.    No other concerns at this time.   Past Medical History:  Diagnosis Date   Diabetes mellitus without complication (HCC)    Hypertension     Past Surgical History:  Procedure Laterality Date   BACK SURGERY  2014   COLONOSCOPY WITH PROPOFOL N/A 03/11/2018   Procedure: COLONOSCOPY WITH PROPOFOL;  Surgeon: Wyline Mood, MD;  Location: [facility]OSCOPY;  Service: Gastroenterology;  Laterality: N/A;   HERNIA REPAIR      Social History   Socioeconomic History   Marital status: Legally Separated    Spouse name: Not on file   Number of children: 8   Years of education: Not on file   Highest education level: Not on file  Occupational History   Occupation: Unemployed  Tobacco Use   Smoking status: Never   Smokeless tobacco: Never  Vaping Use   Vaping status: Never Used  Substance and Sexual Activity   Alcohol use: No    Alcohol/week: 0.0 standard drinks of alcohol   Drug use: No   Sexual activity: Not Currently    Partners: Female  Other Topics Concern   Not on file  Social History Narrative   Patient and his brother live together-his brother works. Mr. Sherman does not work.  Has significant other.        Has 5 adult sons and 3 daughters.      Brother has recently travelled to [place]      Son lives about 2hrs away.   Social Determinants of Health   Financial Resource Strain: Not on file  Food Insecurity: No Food Insecurity (07/05/2022)   Hunger Vital Sign    Worried About Running Out of Food in the Last Year: Never true    Ran Out of Food in the Last Year: Never  true  Transportation Needs: No Transportation Needs (07/05/2022)   PRAPARE - Administrator, Civil Service (Medical): No    Lack of Transportation (Non-Medical): No  Physical Activity: Inactive (02/10/2018)   Exercise Vital Sign    Days of Exercise per Week: 0 days    Minutes of Exercise per Session: 0 min  Stress: No Stress Concern Present (02/10/2018)   [employer] of Occupational Health - Occupational Stress Questionnaire    Feeling of Stress : Not at all  Social Connections: Moderately Isolated (02/10/2018)   Social Connection and Isolation Panel [NHANES]    Frequency of Communication with Friends and Family: Twice a week    Frequency of Social Gatherings with Friends and Family: Twice a week    Attends Religious Services: Never    Database administrator or Organizations: No    Attends Banker Meetings: Never    Marital Status: Separated  Intimate Partner Violence: Not At Risk (07/05/2022)   Humiliation, Afraid, Rape, and Kick questionnaire    Fear of Current or Ex-Partner: No    Emotionally Abused: No    Physically Abused: No    Sexually Abused: No    Family History  Problem Relation Age of  Onset   Heart attack Mother    Diabetes Mother    Hypertension Mother    Heart disease Father    Diabetes Father    Hypertension Father    Diabetes Sister    Hypertension Sister    Diabetes Son    Diabetes Son    Hip dysplasia Daughter     Allergies  Allergen Reactions   Gadolinium Derivatives Itching    Pt began itching across ribs and back and neck Donya Hitch/p Multihance injection    Review of Systems  Constitutional: Negative.   HENT: Negative.    Eyes: Negative.   Respiratory: Negative.    Cardiovascular: Negative.   Gastrointestinal: Negative.   Genitourinary: Negative.   Skin: Negative.   Neurological: Negative.   Endo/Heme/Allergies: Negative.        Objective:   BP 120/65   Pulse 81   Ht 5\' 6"  (1.676 m)   Wt 159 lb 9.6 oz (72.4 kg)    SpO2 97%   BMI 25.76 kg/m   Vitals:   02/28/23 1534  BP: 120/65  Pulse: 81  Height: 5\' 6"  (1.676 m)  Weight: 159 lb 9.6 oz (72.4 kg)  SpO2: 97%  BMI (Calculated): 25.77    Physical Exam Vitals reviewed.  Constitutional:      Appearance: Normal appearance.  HENT:     Head: Normocephalic.     Left Ear: There is no impacted cerumen.     Nose: Nose normal.     Mouth/Throat:     Mouth: Mucous membranes are moist.     Pharynx: No posterior oropharyngeal erythema.  Eyes:     General: Visual field deficit present.     Extraocular Movements: Extraocular movements intact.     Pupils: Pupils are equal, round, and reactive to light.  Cardiovascular:     Rate and Rhythm: Regular rhythm.     Chest Wall: PMI is not displaced.     Pulses: Normal pulses.     Heart sounds: Normal heart sounds. No murmur heard. Pulmonary:     Effort: Pulmonary effort is normal.     Breath sounds: Normal air entry. No rhonchi or rales.  Abdominal:     General: Abdomen is flat. Bowel sounds are normal. There is no distension.     Palpations: Abdomen is soft. There is no hepatomegaly, splenomegaly or mass.     Tenderness: There is no abdominal tenderness.  Musculoskeletal:        General: Normal range of motion.     Cervical back: Normal range of motion and neck supple.     Right lower leg: No edema.     Left lower leg: No edema.  Skin:    General: Skin is warm and dry.  Neurological:     General: No focal deficit present.     Mental Status: He is alert and oriented to person, place, and time.     Cranial Nerves: Cranial nerve deficit (reduced visual acuity) present.     Motor: No weakness.  Psychiatric:        Mood and Affect: Mood normal.        Behavior: Behavior normal.      Results for orders placed or performed in visit on 02/28/23  POC CREATINE & ALBUMIN,URINE  Result Value Ref Range   Microalbumin Ur, POC 150 mg/L   Creatinine, POC 50 mg/dL   Albumin/Creatinine Ratio, Urine, POC  >300   POCT CBG (Fasting - Glucose)  Result Value Ref Range  Glucose Fasting, POC 136 (A) 70 - 99 mg/dL    Recent Results (from the past 2160 hour(Shemeika Starzyk))  POCT CBG (Fasting - Glucose)     Status: Abnormal   Collection Time: 02/28/23  3:59 PM  Result Value Ref Range   Glucose Fasting, POC 136 (A) 70 - 99 mg/dL  POC CREATINE & ALBUMIN,URINE     Status: Abnormal   Collection Time: 02/28/23  4:05 PM  Result Value Ref Range   Microalbumin Ur, POC 150 mg/L   Creatinine, POC 50 mg/dL   Albumin/Creatinine Ratio, Urine, POC >300       Assessment & Plan:   Problem List Items Addressed This Visit       Cardiovascular and Mediastinum   HTN (hypertension), benign - Primary   Relevant Medications   valsartan-hydrochlorothiazide (DIOVAN-HCT) 320-12.5 MG tablet   atorvastatin (LIPITOR) 40 MG tablet     Endocrine   Type 2 diabetes mellitus with peripheral neuropathy (HCC)   Relevant Medications   empagliflozin (JARDIANCE) 25 MG TABS tablet   metFORMIN (GLUCOPHAGE) 1000 MG tablet   pioglitazone (ACTOS) 45 MG tablet   sitaGLIPtin (JANUVIA) 100 MG tablet   valsartan-hydrochlorothiazide (DIOVAN-HCT) 320-12.5 MG tablet   atorvastatin (LIPITOR) 40 MG tablet   glimepiride (AMARYL) 4 MG tablet   Other Relevant Orders   Lipid panel   Hemoglobin A1c   POCT CBG (Fasting - Glucose) (Completed)     Other   Dyslipidemia (Chronic)   Relevant Medications   atorvastatin (LIPITOR) 40 MG tablet   Other Relevant Orders   Lipid panel   Comprehensive metabolic panel   Other Visit Diagnoses     Microalbuminuria due to type 2 diabetes mellitus (HCC)       Relevant Medications   empagliflozin (JARDIANCE) 25 MG TABS tablet   metFORMIN (GLUCOPHAGE) 1000 MG tablet   pioglitazone (ACTOS) 45 MG tablet   sitaGLIPtin (JANUVIA) 100 MG tablet   valsartan-hydrochlorothiazide (DIOVAN-HCT) 320-12.5 MG tablet   atorvastatin (LIPITOR) 40 MG tablet   glimepiride (AMARYL) 4 MG tablet   Other Relevant Orders    POC CREATINE & ALBUMIN,URINE (Completed)       Return in about 2 weeks (around 03/14/2023) for fu with labs prior.   Total time spent: 20 minutes  Luna Fuse, MD  02/28/2023   This document may have been prepared by [facility] Voice Recognition software and as such may include unintentional dictation errors.

## 2023-03-11 ENCOUNTER — Other Ambulatory Visit: Payer: Self-pay

## 2023-03-11 DIAGNOSIS — I10 Essential (primary) hypertension: Secondary | ICD-10-CM

## 2023-03-11 MED ORDER — VALSARTAN-HYDROCHLOROTHIAZIDE 320-12.5 MG PO TABS: 1.0000 | ORAL_TABLET | Freq: Every day | ORAL | 1 refills | Status: DC

## 2023-03-17 ENCOUNTER — Ambulatory Visit: Payer: Medicaid Other | Admitting: Internal Medicine

## 2023-03-17 ENCOUNTER — Encounter: Payer: Self-pay | Admitting: Internal Medicine

## 2023-03-17 ENCOUNTER — Other Ambulatory Visit: Payer: Medicaid Other

## 2023-03-17 ENCOUNTER — Other Ambulatory Visit: Payer: Self-pay | Admitting: Internal Medicine

## 2023-03-17 VITALS — BP 110/65 | HR 72 | Ht 66.0 in | Wt 162.4 lb

## 2023-03-17 DIAGNOSIS — F17210 Nicotine dependence, cigarettes, uncomplicated: Secondary | ICD-10-CM

## 2023-03-17 DIAGNOSIS — R600 Localized edema: Secondary | ICD-10-CM

## 2023-03-17 DIAGNOSIS — R1084 Generalized abdominal pain: Secondary | ICD-10-CM | POA: Diagnosis not present

## 2023-03-17 DIAGNOSIS — E1142 Type 2 diabetes mellitus with diabetic polyneuropathy: Secondary | ICD-10-CM

## 2023-03-17 LAB — POCT CBG (FASTING - GLUCOSE)-MANUAL ENTRY: Glucose Fasting, POC: 204 mg/dL — AB (ref 70–99)

## 2023-03-17 MED ORDER — FUROSEMIDE 20 MG PO TABS: 20.0000 mg | ORAL_TABLET | Freq: Every day | ORAL | 0 refills | Status: DC

## 2023-03-17 MED ORDER — FUROSEMIDE 20 MG PO TABS: 20.0000 mg | ORAL_TABLET | Freq: Every day | ORAL | 1 refills | Status: DC

## 2023-03-17 NOTE — Progress Notes (Signed)
Established Patient Office Visit  Subjective:  Patient ID: Daniel Powell, male    DOB: 1956/01/03  Age: 67 y.o. MRN: [id number]  Chief Complaint  Patient presents with   Follow-up    2 week f/u with lab results    C/o increaseing ankle swelling and abdominal swelling x 5 days. Hasn't taken his antihypertensives for a few days.    No other concerns at this time.   Past Medical History:  Diagnosis Date   Diabetes mellitus without complication (HCC)    Hypertension     Past Surgical History:  Procedure Laterality Date   BACK SURGERY  2014   COLONOSCOPY WITH PROPOFOL N/A 03/11/2018   Procedure: COLONOSCOPY WITH PROPOFOL;  Surgeon: Wyline Mood, MD;  Location: [facility] ENDOSCOPY;  Service: Gastroenterology;  Laterality: N/A;   HERNIA REPAIR      Social History   Socioeconomic History   Marital status: Legally Separated    Spouse name: Not on file   Number of children: 8   Years of education: Not on file   Highest education level: Not on file  Occupational History   Occupation: Unemployed  Tobacco Use   Smoking status: Never   Smokeless tobacco: Never  Vaping Use   Vaping status: Never Used  Substance and Sexual Activity   Alcohol use: No    Alcohol/week: 0.0 standard drinks of alcohol   Drug use: No   Sexual activity: Not Currently    Partners: Female  Other Topics Concern   Not on file  Social History Narrative   Patient and his brother live together-his brother works. Mr. Jamieon does not work.  Has significant other.        Has 5 adult sons and 3 daughters.      Brother has recently travelled to [place]      Son lives about 2hrs away.   Social Determinants of Health   Financial Resource Strain: Not on file  Food Insecurity: No Food Insecurity (07/05/2022)   Hunger Vital Sign    Worried About Running Out of Food in the Last Year: Never true    Ran Out of Food in the Last Year: Never true  Transportation Needs: No Transportation Needs (07/05/2022)   PRAPARE -  Administrator, Civil Service (Medical): No    Lack of Transportation (Non-Medical): No  Physical Activity: Inactive (02/10/2018)   Exercise Vital Sign    Days of Exercise per Week: 0 days    Minutes of Exercise per Session: 0 min  Stress: No Stress Concern Present (02/10/2018)   [employer] of Occupational Health - Occupational Stress Questionnaire    Feeling of Stress : Not at all  Social Connections: Moderately Isolated (02/10/2018)   Social Connection and Isolation Panel [NHANES]    Frequency of Communication with Friends and Family: Twice a week    Frequency of Social Gatherings with Friends and Family: Twice a week    Attends Religious Services: Never    Database administrator or Organizations: No    Attends Banker Meetings: Never    Marital Status: Separated  Intimate Partner Violence: Not At Risk (07/05/2022)   Humiliation, Afraid, Rape, and Kick questionnaire    Fear of Current or Ex-Partner: No    Emotionally Abused: No    Physically Abused: No    Sexually Abused: No    Family History  Problem Relation Age of Onset   Heart attack Mother    Diabetes Mother  Hypertension Mother    Heart disease Father    Diabetes Father    Hypertension Father    Diabetes Sister    Hypertension Sister    Diabetes Son    Diabetes Son    Hip dysplasia Daughter     Allergies  Allergen Reactions   Gadolinium Derivatives Itching    Pt began itching across ribs and back and neck Larrisa Cravey/p Multihance injection    Review of Systems  Constitutional: Negative.   HENT: Negative.    Eyes: Negative.   Respiratory: Negative.  Negative for shortness of breath.   Cardiovascular: Negative.   Gastrointestinal: Negative.   Genitourinary: Negative.   Skin: Negative.   Neurological: Negative.   Endo/Heme/Allergies: Negative.        Objective:   BP 110/65   Pulse 72   Ht 5\' 6"  (1.676 m)   Wt 162 lb 6.4 oz (73.7 kg)   SpO2 97%   BMI 26.21 kg/m   Vitals:    03/17/23 1543  BP: 110/65  Pulse: 72  Height: 5\' 6"  (1.676 m)  Weight: 162 lb 6.4 oz (73.7 kg)  SpO2: 97%  BMI (Calculated): 26.22    Physical Exam Vitals reviewed.  Constitutional:      Appearance: Normal appearance.  HENT:     Head: Normocephalic.     Left Ear: There is no impacted cerumen.     Nose: Nose normal.     Mouth/Throat:     Mouth: Mucous membranes are moist.     Pharynx: No posterior oropharyngeal erythema.  Eyes:     General: Visual field deficit present.     Extraocular Movements: Extraocular movements intact.     Pupils: Pupils are equal, round, and reactive to light.  Cardiovascular:     Rate and Rhythm: Regular rhythm.     Chest Wall: PMI is not displaced.     Pulses: Normal pulses.     Heart sounds: Normal heart sounds. No murmur heard. Pulmonary:     Effort: Pulmonary effort is normal.     Breath sounds: Normal air entry. No rhonchi or rales.  Abdominal:     General: Abdomen is flat. Bowel sounds are normal. There is no distension.     Palpations: Abdomen is soft. There is no hepatomegaly, splenomegaly or mass.     Tenderness: There is no abdominal tenderness.  Musculoskeletal:        General: Normal range of motion.     Cervical back: Normal range of motion and neck supple.     Right lower leg: No edema.     Left lower leg: No edema.  Skin:    General: Skin is warm and dry.  Neurological:     General: No focal deficit present.     Mental Status: He is alert and oriented to person, place, and time.     Cranial Nerves: Cranial nerve deficit (reduced visual acuity) present.     Motor: No weakness.  Psychiatric:        Mood and Affect: Mood normal.        Behavior: Behavior normal.      Results for orders placed or performed in visit on 03/17/23  POCT CBG (Fasting - Glucose)  Result Value Ref Range   Glucose Fasting, POC 204 (A) 70 - 99 mg/dL    Recent Results (from the past 2160 hour(Donelle Baba))  POCT CBG (Fasting - Glucose)     Status:  Abnormal   Collection Time: 02/28/23  3:59 PM  Result Value Ref Range   Glucose Fasting, POC 136 (A) 70 - 99 mg/dL  POC CREATINE & ALBUMIN,URINE     Status: Abnormal   Collection Time: 02/28/23  4:05 PM  Result Value Ref Range   Microalbumin Ur, POC 150 mg/L   Creatinine, POC 50 mg/dL   Albumin/Creatinine Ratio, Urine, POC >300   POCT CBG (Fasting - Glucose)     Status: Abnormal   Collection Time: 03/17/23  3:47 PM  Result Value Ref Range   Glucose Fasting, POC 204 (A) 70 - 99 mg/dL      Assessment & Plan:  As per problem list Resume antihypertensives. Problem List Items Addressed This Visit       Endocrine   Type 2 diabetes mellitus with peripheral neuropathy (HCC) - Primary   Relevant Orders   POCT CBG (Fasting - Glucose) (Completed)   Other Visit Diagnoses     Bilateral leg edema       Relevant Medications   furosemide (LASIX) 20 MG tablet   Generalized abdominal pain       Relevant Orders   DG Abd 1 View       Return in about 2 weeks (around 03/31/2023) for fu with labs prior.   Total time spent: 30 minutes  Luna Fuse, MD  03/17/2023   This document may have been prepared by [facility] [place] Voice Recognition software and as such may include unintentional dictation errors.

## 2023-03-18 LAB — FRUCTOSAMINE: Fructosamine: 357 umol/L — ABNORMAL HIGH (ref 0–285)

## 2023-04-11 ENCOUNTER — Other Ambulatory Visit: Payer: Medicaid Other

## 2023-04-11 DIAGNOSIS — E785 Hyperlipidemia, unspecified: Secondary | ICD-10-CM

## 2023-04-11 DIAGNOSIS — E1142 Type 2 diabetes mellitus with diabetic polyneuropathy: Secondary | ICD-10-CM

## 2023-04-12 LAB — COMPREHENSIVE METABOLIC PANEL
ALT: 17 [IU]/L (ref 0–44)
AST: 17 [IU]/L (ref 0–40)
Albumin: 4.7 g/dL (ref 3.9–4.9)
Alkaline Phosphatase: 60 [IU]/L (ref 44–121)
BUN/Creatinine Ratio: 15 (ref 10–24)
BUN: 25 mg/dL (ref 8–27)
Bilirubin Total: 0.9 mg/dL (ref 0.0–1.2)
CO2: 24 mmol/L (ref 20–29)
Calcium: 9.3 mg/dL (ref 8.6–10.2)
Chloride: 99 mmol/L (ref 96–106)
Creatinine, Ser: 1.65 mg/dL — ABNORMAL HIGH (ref 0.76–1.27)
Globulin, Total: 2.8 g/dL (ref 1.5–4.5)
Glucose: 152 mg/dL — ABNORMAL HIGH (ref 70–99)
Potassium: 4.7 mmol/L (ref 3.5–5.2)
Sodium: 140 mmol/L (ref 134–144)
Total Protein: 7.5 g/dL (ref 6.0–8.5)
eGFR: 46 mL/min/{1.73_m2} — ABNORMAL LOW (ref >59)

## 2023-04-12 LAB — LIPID PANEL
Chol/HDL Ratio: 5.3 {ratio} — ABNORMAL HIGH (ref 0.0–5.0)
Cholesterol, Total: 187 mg/dL (ref 100–199)
HDL: 35 mg/dL — ABNORMAL LOW (ref >39)
LDL Chol Calc (NIH): 105 mg/dL — ABNORMAL HIGH (ref 0–99)
Triglycerides: 274 mg/dL — ABNORMAL HIGH (ref 0–149)
VLDL Cholesterol Cal: 47 mg/dL — ABNORMAL HIGH (ref 5–40)

## 2023-04-12 LAB — HEMOGLOBIN A1C
Est. average glucose Bld gHb Est-mCnc: 189 mg/dL
Hgb A1c MFr Bld: 8.2 % — ABNORMAL HIGH (ref 4.8–5.6)

## 2023-04-14 ENCOUNTER — Ambulatory Visit: Payer: Medicaid Other | Admitting: Internal Medicine

## 2023-04-14 VITALS — BP 128/73 | HR 72 | Ht 66.0 in | Wt 164.2 lb

## 2023-04-14 DIAGNOSIS — E1142 Type 2 diabetes mellitus with diabetic polyneuropathy: Secondary | ICD-10-CM | POA: Diagnosis not present

## 2023-04-14 DIAGNOSIS — K8020 Calculus of gallbladder without cholecystitis without obstruction: Secondary | ICD-10-CM

## 2023-04-14 DIAGNOSIS — R1084 Generalized abdominal pain: Secondary | ICD-10-CM | POA: Diagnosis not present

## 2023-04-14 LAB — POCT CBG (FASTING - GLUCOSE)-MANUAL ENTRY: Glucose Fasting, POC: 326 mg/dL — AB (ref 70–99)

## 2023-04-14 NOTE — Progress Notes (Signed)
Established Patient Office Visit  Subjective:  Patient ID: Daniel Powell, male    DOB: 24-Jan-1956  Age: 67 y.o. MRN: [id number]  No chief complaint on file.   No new complaints, here for lab review and medication refills. Labs reviewed and notable for uncontrolled diabetes, A1c not at target, lipids at target with unremarkable cmp. Denies any hypoglycemic episodes and home bg readings have been at target. Reports abdominal bloating and occasional pain but denies change in bms. Workup for this 5 years ago was only notable for small gallstones.  No other concerns at this time.   Past Medical History:  Diagnosis Date   Diabetes mellitus without complication (HCC)    Hypertension     Past Surgical History:  Procedure Laterality Date   BACK SURGERY  2014   COLONOSCOPY WITH PROPOFOL N/A 03/11/2018   Procedure: COLONOSCOPY WITH PROPOFOL;  Surgeon: Wyline Mood, MD;  Location: [facility] ENDOSCOPY;  Service: Gastroenterology;  Laterality: N/A;   HERNIA REPAIR      Social History   Socioeconomic History   Marital status: Legally Separated    Spouse name: Not on file   Number of children: 8   Years of education: Not on file   Highest education level: Not on file  Occupational History   Occupation: Unemployed  Tobacco Use   Smoking status: Never   Smokeless tobacco: Never  Vaping Use   Vaping status: Never Used  Substance and Sexual Activity   Alcohol use: No    Alcohol/week: 0.0 standard drinks of alcohol   Drug use: No   Sexual activity: Not Currently    Partners: Female  Other Topics Concern   Not on file  Social History Narrative   Patient and his brother live together-his brother works. Mr. Devyn does not work.  Has significant other.        Has 5 adult sons and 3 daughters.      Brother has recently travelled to [place]      Son lives about 2hrs away.   Social Determinants of Health   Financial Resource Strain: Not on file  Food Insecurity: No Food Insecurity  (07/05/2022)   Hunger Vital Sign    Worried About Running Out of Food in the Last Year: Never true    Ran Out of Food in the Last Year: Never true  Transportation Needs: No Transportation Needs (07/05/2022)   PRAPARE - Administrator, Civil Service (Medical): No    Lack of Transportation (Non-Medical): No  Physical Activity: Inactive (02/10/2018)   Exercise Vital Sign    Days of Exercise per Week: 0 days    Minutes of Exercise per Session: 0 min  Stress: No Stress Concern Present (02/10/2018)   [employer] of Occupational Health - Occupational Stress Questionnaire    Feeling of Stress : Not at all  Social Connections: Moderately Isolated (02/10/2018)   Social Connection and Isolation Panel [NHANES]    Frequency of Communication with Friends and Family: Twice a week    Frequency of Social Gatherings with Friends and Family: Twice a week    Attends Religious Services: Never    Database administrator or Organizations: No    Attends Banker Meetings: Never    Marital Status: Separated  Intimate Partner Violence: Not At Risk (07/05/2022)   Humiliation, Afraid, Rape, and Kick questionnaire    Fear of Current or Ex-Partner: No    Emotionally Abused: No    Physically Abused: No  Sexually Abused: No    Family History  Problem Relation Age of Onset   Heart attack Mother    Diabetes Mother    Hypertension Mother    Heart disease Father    Diabetes Father    Hypertension Father    Diabetes Sister    Hypertension Sister    Diabetes Son    Diabetes Son    Hip dysplasia Daughter     Allergies  Allergen Reactions   Gadolinium Derivatives Itching    Pt began itching across ribs and back and neck Wilber Fini/p Multihance injection    Review of Systems  Constitutional: Negative.   HENT: Negative.    Eyes: Negative.   Respiratory: Negative.  Negative for shortness of breath.   Cardiovascular: Negative.   Gastrointestinal: Negative.   Genitourinary: Negative.    Skin: Negative.   Neurological: Negative.   Endo/Heme/Allergies: Negative.        Objective:   BP 128/73   Pulse 72   Ht 5\' 6"  (1.676 m)   Wt 164 lb 3.2 oz (74.5 kg)   SpO2 98%   BMI 26.50 kg/m   Vitals:   04/14/23 1500  BP: 128/73  Pulse: 72  Height: 5\' 6"  (1.676 m)  Weight: 164 lb 3.2 oz (74.5 kg)  SpO2: 98%  BMI (Calculated): 26.52    Physical Exam Vitals reviewed.  Constitutional:      Appearance: Normal appearance.  HENT:     Head: Normocephalic.     Left Ear: There is no impacted cerumen.     Nose: Nose normal.     Mouth/Throat:     Mouth: Mucous membranes are moist.     Pharynx: No posterior oropharyngeal erythema.  Eyes:     General: Visual field deficit present.     Extraocular Movements: Extraocular movements intact.     Pupils: Pupils are equal, round, and reactive to light.  Cardiovascular:     Rate and Rhythm: Regular rhythm.     Chest Wall: PMI is not displaced.     Pulses: Normal pulses.     Heart sounds: Normal heart sounds. No murmur heard. Pulmonary:     Effort: Pulmonary effort is normal.     Breath sounds: Normal air entry. No rhonchi or rales.  Abdominal:     General: Abdomen is flat. Bowel sounds are normal. There is no distension.     Palpations: Abdomen is soft. There is no hepatomegaly, splenomegaly or mass.     Tenderness: There is no abdominal tenderness.  Musculoskeletal:        General: Normal range of motion.     Cervical back: Normal range of motion and neck supple.     Right lower leg: No edema.     Left lower leg: No edema.  Skin:    General: Skin is warm and dry.  Neurological:     General: No focal deficit present.     Mental Status: He is alert and oriented to person, place, and time.     Cranial Nerves: Cranial nerve deficit (reduced visual acuity) present.     Motor: No weakness.  Psychiatric:        Mood and Affect: Mood normal.        Behavior: Behavior normal.      Results for orders placed or  performed in visit on 04/14/23  POCT CBG (Fasting - Glucose)  Result Value Ref Range   Glucose Fasting, POC 326 (A) 70 - 99 mg/dL  Recent Results (from the past 2160 hour(Aylene Acoff))  POCT CBG (Fasting - Glucose)     Status: Abnormal   Collection Time: 02/28/23  3:59 PM  Result Value Ref Range   Glucose Fasting, POC 136 (A) 70 - 99 mg/dL  POC CREATINE & ALBUMIN,URINE     Status: Abnormal   Collection Time: 02/28/23  4:05 PM  Result Value Ref Range   Microalbumin Ur, POC 150 mg/L   Creatinine, POC 50 mg/dL   Albumin/Creatinine Ratio, Urine, POC >300   Fructosamine     Status: Abnormal   Collection Time: 03/17/23  9:44 AM  Result Value Ref Range   Fructosamine 357 (H) 0 - 285 umol/L    Comment: Published reference interval for apparently healthy subjects between age 73 and 8 is 60 - 285 umol/L and in a poorly controlled diabetic population is 228 - 563 umol/L with a mean of 396 umol/L.   POCT CBG (Fasting - Glucose)     Status: Abnormal   Collection Time: 03/17/23  3:47 PM  Result Value Ref Range   Glucose Fasting, POC 204 (A) 70 - 99 mg/dL  Hemoglobin U2G     Status: Abnormal   Collection Time: 04/11/23 10:25 AM  Result Value Ref Range   Hgb A1c MFr Bld 8.2 (H) 4.8 - 5.6 %    Comment:          Prediabetes: 5.7 - 6.4          Diabetes: >6.4          Glycemic control for adults with diabetes: <7.0    Est. average glucose Bld gHb Est-mCnc 189 mg/dL  Comprehensive metabolic panel     Status: Abnormal   Collection Time: 04/11/23 10:25 AM  Result Value Ref Range   Glucose 152 (H) 70 - 99 mg/dL   BUN 25 8 - 27 mg/dL   Creatinine, Ser 2.54 (H) 0.76 - 1.27 mg/dL   eGFR 46 (L) >27 CW/CBJ/6.28   BUN/Creatinine Ratio 15 10 - 24   Sodium 140 134 - 144 mmol/L   Potassium 4.7 3.5 - 5.2 mmol/L   Chloride 99 96 - 106 mmol/L   CO2 24 20 - 29 mmol/L   Calcium 9.3 8.6 - 10.2 mg/dL   Total Protein 7.5 6.0 - 8.5 g/dL   Albumin 4.7 3.9 - 4.9 g/dL   Globulin, Total 2.8 1.5 - 4.5 g/dL    Bilirubin Total 0.9 0.0 - 1.2 mg/dL   Alkaline Phosphatase 60 44 - 121 IU/L   AST 17 0 - 40 IU/L   ALT 17 0 - 44 IU/L  Lipid panel     Status: Abnormal   Collection Time: 04/11/23 10:25 AM  Result Value Ref Range   Cholesterol, Total 187 100 - 199 mg/dL   Triglycerides 315 (H) 0 - 149 mg/dL   HDL 35 (L) >17 mg/dL   VLDL Cholesterol Cal 47 (H) 5 - 40 mg/dL   LDL Chol Calc (NIH) 616 (H) 0 - 99 mg/dL   Chol/HDL Ratio 5.3 (H) 0.0 - 5.0 ratio    Comment:                                   T. Chol/HDL Ratio  Men  Women                               1/2 Avg.Risk  3.4    3.3                                   Avg.Risk  5.0    4.4                                2X Avg.Risk  9.6    7.1                                3X Avg.Risk 23.4   11.0   POCT CBG (Fasting - Glucose)     Status: Abnormal   Collection Time: 04/14/23  3:05 PM  Result Value Ref Range   Glucose Fasting, POC 326 (A) 70 - 99 mg/dL      Assessment & Plan:  As per problem list. Patient instructed to comply with medications as prescribed. Problem List Items Addressed This Visit       Digestive   Gallstones   Relevant Orders   US Abdomen Limited     Endocrine   Type 2 diabetes mellitus with peripheral neuropathy (HCC) - Primary   Relevant Orders   POCT CBG (Fasting - Glucose) (Completed)   Other Visit Diagnoses     Generalized abdominal pain           Return in about 3 weeks (around 05/05/2023) for Imaging results.   Total time spent: 20 minutes  Luna Fuse, MD  04/14/2023   This document may have been prepared by [facility] Voice Recognition software and as such may include unintentional dictation errors.

## 2023-04-23 ENCOUNTER — Ambulatory Visit (INDEPENDENT_AMBULATORY_CARE_PROVIDER_SITE_OTHER): Payer: Medicaid Other

## 2023-04-23 DIAGNOSIS — K8020 Calculus of gallbladder without cholecystitis without obstruction: Secondary | ICD-10-CM

## 2023-05-05 ENCOUNTER — Ambulatory Visit: Payer: Medicaid Other | Admitting: Internal Medicine

## 2023-05-05 ENCOUNTER — Encounter: Payer: Self-pay | Admitting: Internal Medicine

## 2023-05-05 VITALS — BP 122/78 | HR 85 | Ht 66.0 in | Wt 159.2 lb

## 2023-05-05 DIAGNOSIS — E1142 Type 2 diabetes mellitus with diabetic polyneuropathy: Secondary | ICD-10-CM | POA: Diagnosis not present

## 2023-05-05 DIAGNOSIS — J4520 Mild intermittent asthma, uncomplicated: Secondary | ICD-10-CM

## 2023-05-05 DIAGNOSIS — K760 Fatty (change of) liver, not elsewhere classified: Secondary | ICD-10-CM

## 2023-05-05 DIAGNOSIS — R140 Abdominal distension (gaseous): Secondary | ICD-10-CM

## 2023-05-05 DIAGNOSIS — Z0130 Encounter for examination of blood pressure without abnormal findings: Secondary | ICD-10-CM

## 2023-05-05 MED ORDER — ONETOUCH DELICA LANCING DEV MISC: 1.0000 | Freq: Once | 0 refills | Status: DC

## 2023-05-05 MED ORDER — POLYETHYLENE GLYCOL 3350 17 G PO PACK: 17.0000 g | PACK | Freq: Every day | ORAL | 1 refills | Status: AC

## 2023-05-05 MED ORDER — MILK OF MAGNESIA 7.75 % PO SUSP: 30.0000 mL | Freq: Every day | ORAL | 3 refills | Status: DC | PRN

## 2023-05-05 MED ORDER — ALBUTEROL SULFATE HFA 108 (90 BASE) MCG/ACT IN AERS
2.0000 | INHALATION_SPRAY | Freq: Four times a day (QID) | RESPIRATORY_TRACT | 1 refills | Status: DC | PRN
Start: 2023-05-05 — End: 2023-11-18

## 2023-05-05 MED ORDER — ACCU-CHEK GUIDE VI STRP: ORAL_STRIP | 12 refills | Status: DC

## 2023-05-05 MED ORDER — ONETOUCH ULTRA TEST VI STRP: ORAL_STRIP | 12 refills | Status: DC

## 2023-05-05 MED ORDER — ONETOUCH ULTRASOFT 2 LANCETS MISC: 1.0000 | Freq: Two times a day (BID) | 3 refills | Status: DC

## 2023-05-05 MED ORDER — ONETOUCH ULTRA 2 W/DEVICE KIT: 1.0000 | PACK | Freq: Once | 0 refills | Status: DC

## 2023-05-05 MED ORDER — RYBELSUS 7 MG PO TABS: 7.0000 mg | ORAL_TABLET | Freq: Every day | ORAL | 0 refills | Status: DC

## 2023-05-05 MED ORDER — ACCU-CHEK SOFTCLIX LANCETS MISC: 12 refills | Status: DC

## 2023-05-05 MED ORDER — METFORMIN HCL 1000 MG PO TABS: 1000.0000 mg | ORAL_TABLET | Freq: Two times a day (BID) | ORAL | 0 refills | Status: DC

## 2023-05-05 MED ORDER — ACCU-CHEK GUIDE W/DEVICE KIT: 1.0000 | PACK | Freq: Every day | 0 refills | Status: AC

## 2023-05-05 NOTE — Progress Notes (Signed)
Established Patient Office Visit  Subjective:  Patient ID: Daniel Powell, male    DOB: 03-16-1956  Age: 67 y.o. MRN: [id number]  No chief complaint on file.   Still c/o abdominal distension. USS notable for fatty liver with cholelithiasis without obstruction.  No other concerns at this time.   Past Medical History:  Diagnosis Date   Diabetes mellitus without complication (HCC)    Hypertension     Past Surgical History:  Procedure Laterality Date   BACK SURGERY  2014   COLONOSCOPY WITH PROPOFOL N/A 03/11/2018   Procedure: COLONOSCOPY WITH PROPOFOL;  Surgeon: Wyline Mood, MD;  Location: [facility] ENDOSCOPY;  Service: Gastroenterology;  Laterality: N/A;   HERNIA REPAIR      Social History   Socioeconomic History   Marital status: Legally Separated    Spouse name: Not on file   Number of children: 8   Years of education: Not on file   Highest education level: Not on file  Occupational History   Occupation: Unemployed  Tobacco Use   Smoking status: Never   Smokeless tobacco: Never  Vaping Use   Vaping status: Never Used  Substance and Sexual Activity   Alcohol use: No    Alcohol/week: 0.0 standard drinks of alcohol   Drug use: No   Sexual activity: Not Currently    Partners: Female  Other Topics Concern   Not on file  Social History Narrative   Patient and his brother live together-his brother works. Mr. Kanari does not work.  Has significant other.        Has 5 adult sons and 3 daughters.      Brother has recently travelled to [place]      Son lives about 2hrs away.   Social Determinants of Health   Financial Resource Strain: Not on file  Food Insecurity: No Food Insecurity (07/05/2022)   Hunger Vital Sign    Worried About Running Out of Food in the Last Year: Never true    Ran Out of Food in the Last Year: Never true  Transportation Needs: No Transportation Needs (07/05/2022)   PRAPARE - Administrator, Civil Service (Medical): No    Lack of  Transportation (Non-Medical): No  Physical Activity: Inactive (02/10/2018)   Exercise Vital Sign    Days of Exercise per Week: 0 days    Minutes of Exercise per Session: 0 min  Stress: No Stress Concern Present (02/10/2018)   [employer] of Occupational Health - Occupational Stress Questionnaire    Feeling of Stress : Not at all  Social Connections: Moderately Isolated (02/10/2018)   Social Connection and Isolation Panel [NHANES]    Frequency of Communication with Friends and Family: Twice a week    Frequency of Social Gatherings with Friends and Family: Twice a week    Attends Religious Services: Never    Database administrator or Organizations: No    Attends Banker Meetings: Never    Marital Status: Separated  Intimate Partner Violence: Not At Risk (07/05/2022)   Humiliation, Afraid, Rape, and Kick questionnaire    Fear of Current or Ex-Partner: No    Emotionally Abused: No    Physically Abused: No    Sexually Abused: No    Family History  Problem Relation Age of Onset   Heart attack Mother    Diabetes Mother    Hypertension Mother    Heart disease Father    Diabetes Father    Hypertension Father  Diabetes Sister    Hypertension Sister    Diabetes Son    Diabetes Son    Hip dysplasia Daughter     Allergies  Allergen Reactions   Gadolinium Derivatives Itching    Pt began itching across ribs and back and neck Barak Bialecki/p Multihance injection    Review of Systems  All other systems reviewed and are negative.      Objective:   BP 122/78   Pulse 85   Ht 5\' 6"  (1.676 m)   Wt 159 lb 3.2 oz (72.2 kg)   SpO2 99%   BMI 25.70 kg/m   Vitals:   05/05/23 1452  BP: 122/78  Pulse: 85  Height: 5\' 6"  (1.676 m)  Weight: 159 lb 3.2 oz (72.2 kg)  SpO2: 99%  BMI (Calculated): 25.71    Physical Exam Vitals reviewed.  Constitutional:      Appearance: Normal appearance.  HENT:     Head: Normocephalic.     Left Ear: There is no impacted cerumen.      Nose: Nose normal.     Mouth/Throat:     Mouth: Mucous membranes are moist.     Pharynx: No posterior oropharyngeal erythema.  Eyes:     General: Visual field deficit present.     Extraocular Movements: Extraocular movements intact.     Pupils: Pupils are equal, round, and reactive to light.  Cardiovascular:     Rate and Rhythm: Regular rhythm.     Chest Wall: PMI is not displaced.     Pulses: Normal pulses.     Heart sounds: Normal heart sounds. No murmur heard. Pulmonary:     Effort: Pulmonary effort is normal.     Breath sounds: Normal air entry. No rhonchi or rales.  Abdominal:     General: Abdomen is flat. Bowel sounds are normal. There is no distension.     Palpations: Abdomen is soft. There is no hepatomegaly, splenomegaly or mass.     Tenderness: There is no abdominal tenderness.  Musculoskeletal:        General: Normal range of motion.     Cervical back: Normal range of motion and neck supple.     Right lower leg: No edema.     Left lower leg: No edema.  Skin:    General: Skin is warm and dry.  Neurological:     General: No focal deficit present.     Mental Status: He is alert and oriented to person, place, and time.     Cranial Nerves: Cranial nerve deficit (reduced visual acuity) present.     Motor: No weakness.  Psychiatric:        Mood and Affect: Mood normal.        Behavior: Behavior normal.      No results found for any visits on 05/05/23.  Recent Results (from the past 2160 hour(Catalia Massett))  POCT CBG (Fasting - Glucose)     Status: Abnormal   Collection Time: 02/28/23  3:59 PM  Result Value Ref Range   Glucose Fasting, POC 136 (A) 70 - 99 mg/dL  POC CREATINE & ALBUMIN,URINE     Status: Abnormal   Collection Time: 02/28/23  4:05 PM  Result Value Ref Range   Microalbumin Ur, POC 150 mg/L   Creatinine, POC 50 mg/dL   Albumin/Creatinine Ratio, Urine, POC >300   Fructosamine     Status: Abnormal   Collection Time: 03/17/23  9:44 AM  Result Value Ref Range    Fructosamine 357 (H)  0 - 285 umol/L    Comment: Published reference interval for apparently healthy subjects between age 33 and 28 is 76 - 285 umol/L and in a poorly controlled diabetic population is 228 - 563 umol/L with a mean of 396 umol/L.   POCT CBG (Fasting - Glucose)     Status: Abnormal   Collection Time: 03/17/23  3:47 PM  Result Value Ref Range   Glucose Fasting, POC 204 (A) 70 - 99 mg/dL  Hemoglobin N5A     Status: Abnormal   Collection Time: 04/11/23 10:25 AM  Result Value Ref Range   Hgb A1c MFr Bld 8.2 (H) 4.8 - 5.6 %    Comment:          Prediabetes: 5.7 - 6.4          Diabetes: >6.4          Glycemic control for adults with diabetes: <7.0    Est. average glucose Bld gHb Est-mCnc 189 mg/dL  Comprehensive metabolic panel     Status: Abnormal   Collection Time: 04/11/23 10:25 AM  Result Value Ref Range   Glucose 152 (H) 70 - 99 mg/dL   BUN 25 8 - 27 mg/dL   Creatinine, Ser 2.13 (H) 0.76 - 1.27 mg/dL   eGFR 46 (L) >08 MV/HQI/6.96   BUN/Creatinine Ratio 15 10 - 24   Sodium 140 134 - 144 mmol/L   Potassium 4.7 3.5 - 5.2 mmol/L   Chloride 99 96 - 106 mmol/L   CO2 24 20 - 29 mmol/L   Calcium 9.3 8.6 - 10.2 mg/dL   Total Protein 7.5 6.0 - 8.5 g/dL   Albumin 4.7 3.9 - 4.9 g/dL   Globulin, Total 2.8 1.5 - 4.5 g/dL   Bilirubin Total 0.9 0.0 - 1.2 mg/dL   Alkaline Phosphatase 60 44 - 121 IU/L   AST 17 0 - 40 IU/L   ALT 17 0 - 44 IU/L  Lipid panel     Status: Abnormal   Collection Time: 04/11/23 10:25 AM  Result Value Ref Range   Cholesterol, Total 187 100 - 199 mg/dL   Triglycerides 295 (H) 0 - 149 mg/dL   HDL 35 (L) >28 mg/dL   VLDL Cholesterol Cal 47 (H) 5 - 40 mg/dL   LDL Chol Calc (NIH) 413 (H) 0 - 99 mg/dL   Chol/HDL Ratio 5.3 (H) 0.0 - 5.0 ratio    Comment:                                   T. Chol/HDL Ratio                                             Men  Women                               1/2 Avg.Risk  3.4    3.3                                    Avg.Risk  5.0    4.4  2X Avg.Risk  9.6    7.1                                3X Avg.Risk 23.4   11.0   POCT CBG (Fasting - Glucose)     Status: Abnormal   Collection Time: 04/14/23  3:05 PM  Result Value Ref Range   Glucose Fasting, POC 326 (A) 70 - 99 mg/dL      Assessment & Plan:  As per problem list  Problem List Items Addressed This Visit       Endocrine   Type 2 diabetes mellitus with peripheral neuropathy (HCC)   Relevant Medications   metFORMIN (GLUCOPHAGE) 1000 MG tablet   Blood Glucose Monitoring Suppl (ONE TOUCH ULTRA 2) w/Device KIT   glucose blood (ONETOUCH ULTRA TEST) test strip   Lancet Devices (ONE TOUCH DELICA LANCING DEV) MISC   OneTouch UltraSoft 2 Lancets MISC   Semaglutide (RYBELSUS) 7 MG TABS   Other Relevant Orders   Fructosamine   Other Visit Diagnoses     Fatty liver    -  Primary       Return in about 3 weeks (around 05/26/2023) for fu with labs prior.   Total time spent: 30 minutes  Luna Fuse, MD  05/05/2023   This document may have been prepared by [facility] Voice Recognition software and as such may include unintentional dictation errors.

## 2023-05-26 ENCOUNTER — Encounter: Payer: Self-pay | Admitting: Internal Medicine

## 2023-05-26 ENCOUNTER — Ambulatory Visit: Payer: Medicaid Other | Admitting: Internal Medicine

## 2023-05-26 VITALS — BP 121/82 | HR 87 | Ht 64.0 in | Wt 160.4 lb

## 2023-05-26 DIAGNOSIS — R140 Abdominal distension (gaseous): Secondary | ICD-10-CM | POA: Diagnosis not present

## 2023-05-26 DIAGNOSIS — Z0130 Encounter for examination of blood pressure without abnormal findings: Secondary | ICD-10-CM

## 2023-05-26 DIAGNOSIS — R600 Localized edema: Secondary | ICD-10-CM

## 2023-05-26 DIAGNOSIS — R4189 Other symptoms and signs involving cognitive functions and awareness: Secondary | ICD-10-CM

## 2023-05-26 DIAGNOSIS — E1142 Type 2 diabetes mellitus with diabetic polyneuropathy: Secondary | ICD-10-CM

## 2023-05-26 DIAGNOSIS — R1084 Generalized abdominal pain: Secondary | ICD-10-CM | POA: Diagnosis not present

## 2023-05-26 DIAGNOSIS — K439 Ventral hernia without obstruction or gangrene: Secondary | ICD-10-CM

## 2023-05-26 DIAGNOSIS — K56609 Unspecified intestinal obstruction, unspecified as to partial versus complete obstruction: Secondary | ICD-10-CM

## 2023-05-26 LAB — POCT CBG (FASTING - GLUCOSE)-MANUAL ENTRY: Glucose Fasting, POC: 179 mg/dL — AB (ref 70–99)

## 2023-05-26 MED ORDER — FUROSEMIDE 20 MG PO TABS: 20.0000 mg | ORAL_TABLET | Freq: Every day | ORAL | 1 refills | Status: DC

## 2023-05-26 NOTE — Progress Notes (Signed)
Established Patient Office Visit  Subjective:  Patient ID: Daniel Powell, male    DOB: 1956/01/20  Age: 67 y.o. MRN: [id number]  No chief complaint on file.   No new complaints, here for lab review and medication refills. Failed to have previsit labs done and only checks his bg a few times a week. 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    No other concerns at this time.   Past Medical History:  Diagnosis Date   Diabetes mellitus without complication (HCC)    Hypertension     Past Surgical History:  Procedure Laterality Date   BACK SURGERY  2014   COLONOSCOPY WITH PROPOFOL N/A 03/11/2018   Procedure: COLONOSCOPY WITH PROPOFOL;  Surgeon: Wyline Mood, MD;  Location: [facility] ENDOSCOPY;  Service: Gastroenterology;  Laterality: N/A;   HERNIA REPAIR      Social History   Socioeconomic History   Marital status: Legally Separated    Spouse name: Not on file   Number of children: 8   Years of education: Not on file   Highest education level: Not on file  Occupational History   Occupation: Unemployed  Tobacco Use   Smoking status: Never   Smokeless tobacco: Never  Vaping Use   Vaping status: Never Used  Substance and Sexual Activity   Alcohol use: No    Alcohol/week: 0.0 standard drinks of alcohol   Drug use: No   Sexual activity: Not Currently    Partners: Female  Other Topics Concern   Not on file  Social History Narrative   Patient and his brother live together-his brother works. Daniel Powell does not work.  Has significant other.        Has 5 adult sons and 3 daughters.      Brother has recently travelled to [place]      Son lives about 2hrs away.   Social Determinants of Health   Financial Resource Strain: Not on file  Food Insecurity: No Food Insecurity (07/05/2022)   Hunger Vital Sign    Worried About Running Out of Food in the Last Year: Never true    Ran Out of Food in the Last Year: Never true  Transportation Needs:  No Transportation Needs (07/05/2022)   PRAPARE - Administrator, Civil Service (Medical): No    Lack of Transportation (Non-Medical): No  Physical Activity: Inactive (02/10/2018)   Exercise Vital Sign    Days of Exercise per Week: 0 days    Minutes of Exercise per Session: 0 min  Stress: No Stress Concern Present (02/10/2018)   [employer] of Occupational Health - Occupational Stress Questionnaire    Feeling of Stress : Not at all  Social Connections: Moderately Isolated (02/10/2018)   Social Connection and Isolation Panel [NHANES]    Frequency of Communication with Friends and Family: Twice a week    Frequency of Social Gatherings with Friends and Family: Twice a week    Attends Religious Services: Never    Database administrator or Organizations: No    Attends Banker Meetings: Never    Marital Status: Separated  Intimate Partner Violence: Not At Risk (07/05/2022)   Humiliation, Afraid, Rape, and Kick questionnaire    Fear of Current or Ex-Partner: No    Emotionally Abused: No    Physically Abused: No    Sexually Abused: No    Family History  Problem Relation Age of Onset   Heart attack Mother  Diabetes Mother    Hypertension Mother    Heart disease Father    Diabetes Father    Hypertension Father    Diabetes Sister    Hypertension Sister    Diabetes Son    Diabetes Son    Hip dysplasia Daughter     Allergies  Allergen Reactions   Gadolinium Derivatives Itching    Pt began itching across ribs and back and neck Daniel Powell/p Multihance injection    Outpatient Medications Prior to Visit  Medication Sig   Accu-Chek Softclix Lancets lancets Use as instructed   acetaminophen (TYLENOL) 325 MG tablet Take 2 tablets (650 mg total) by mouth every 6 (six) hours as needed for mild pain or headache (fever >/= 101).   albuterol (VENTOLIN HFA) 108 (90 Base) MCG/ACT inhaler Inhale 2 puffs into the lungs every 6 (six) hours as needed.   allopurinol (ZYLOPRIM)  300 MG tablet Take 300 mg by mouth daily.   atorvastatin (LIPITOR) 40 MG tablet Take 1 tablet (40 mg total) by mouth daily.   Blood Glucose Monitoring Suppl (ACCU-CHEK GUIDE) w/Device KIT 1 Device by Does not apply route daily.   empagliflozin (JARDIANCE) 25 MG TABS tablet Take 1 tablet (25 mg total) by mouth daily.   glimepiride (AMARYL) 4 MG tablet Take 1 tablet (4 mg total) by mouth 2 (two) times daily.   glucose blood (ACCU-CHEK GUIDE) test strip Use as instructed   lactulose (CEPHULAC) 20 g packet Take 1 packet (20 g total) by mouth daily as needed (constipation).   magnesium hydroxide (MILK OF MAGNESIA) 400 MG/5ML suspension Take 30 mLs by mouth daily as needed for mild constipation.   metFORMIN (GLUCOPHAGE) 1000 MG tablet Take 1 tablet (1,000 mg total) by mouth 2 (two) times daily.   pantoprazole (PROTONIX) 40 MG tablet Take 1 tablet (40 mg total) by mouth daily.   pioglitazone (ACTOS) 45 MG tablet Take 1 tablet (45 mg total) by mouth every morning.   Semaglutide (RYBELSUS) 7 MG TABS Take 1 tablet (7 mg total) by mouth daily.   senna-docusate (SENOKOT-Daniel Powell) 8.6-50 MG tablet Take 2 tablets by mouth 2 (two) times daily. Hold this medicine if loose stools   traZODone (DESYREL) 50 MG tablet Take 0.5-1 tablets (25-50 mg total) by mouth at bedtime as needed for sleep.   valsartan-hydrochlorothiazide (DIOVAN-HCT) 320-12.5 MG tablet Take 1 tablet by mouth daily.   [DISCONTINUED] furosemide (LASIX) 20 MG tablet Take 1 tablet (20 mg total) by mouth daily for 14 days.   No facility-administered medications prior to visit.    Review of Systems  Constitutional: Negative.   HENT: Negative.    Eyes: Negative.   Respiratory: Negative.  Negative for shortness of breath.   Cardiovascular: Negative.   Gastrointestinal: Negative.   Genitourinary: Negative.   Skin: Negative.   Neurological: Negative.   Endo/Heme/Allergies: Negative.        Objective:   BP 121/82   Pulse 87   Ht 5\' 4"  (1.626 m)    Wt 160 lb 6.4 oz (72.8 kg)   SpO2 98%   BMI 27.53 kg/m   Vitals:   05/26/23 1454  BP: 121/82  Pulse: 87  Height: 5\' 4"  (1.626 m)  Weight: 160 lb 6.4 oz (72.8 kg)  SpO2: 98%  BMI (Calculated): 27.52    Physical Exam Vitals reviewed.  Constitutional:      Appearance: Normal appearance.  HENT:     Head: Normocephalic.     Left Ear: There is no impacted cerumen.  Nose: Nose normal.     Mouth/Throat:     Mouth: Mucous membranes are moist.     Pharynx: No posterior oropharyngeal erythema.  Eyes:     General: Visual field deficit present.     Extraocular Movements: Extraocular movements intact.     Pupils: Pupils are equal, round, and reactive to light.  Cardiovascular:     Rate and Rhythm: Regular rhythm.     Chest Wall: PMI is not displaced.     Pulses: Normal pulses.     Heart sounds: Normal heart sounds. No murmur heard. Pulmonary:     Effort: Pulmonary effort is normal.     Breath sounds: Normal air entry. No rhonchi or rales.  Abdominal:     General: Abdomen is flat. Bowel sounds are normal. There is no distension.     Palpations: Abdomen is soft. There is no hepatomegaly, splenomegaly or mass.     Tenderness: There is no abdominal tenderness.  Musculoskeletal:        General: Normal range of motion.     Cervical back: Normal range of motion and neck supple.     Right lower leg: No edema.     Left lower leg: No edema.  Skin:    General: Skin is warm and dry.  Neurological:     General: No focal deficit present.     Mental Status: He is alert. He is confused.     Cranial Nerves: Cranial nerve deficit (reduced visual acuity) present.     Motor: No weakness.  Psychiatric:        Mood and Affect: Mood normal.        Behavior: Behavior normal.      Results for orders placed or performed in visit on 05/26/23  POCT CBG (Fasting - Glucose)  Result Value Ref Range   Glucose Fasting, POC 179 (A) 70 - 99 mg/dL        Assessment & Plan:  As per problem  list  Problem List Items Addressed This Visit       Endocrine   Type 2 diabetes mellitus with peripheral neuropathy (HCC) - Primary   Relevant Orders   POCT CBG (Fasting - Glucose) (Completed)   Other Visit Diagnoses     Generalized abdominal pain       Abdominal distension (gaseous)       Relevant Orders   NM Gastric Emptying   Bilateral leg edema       Relevant Medications   furosemide (LASIX) 20 MG tablet   Ventral hernia without obstruction or gangrene       Intestinal obstruction, unspecified cause, unspecified whether partial or complete (HCC)       Relevant Orders   CT ABDOMEN PELVIS W CONTRAST   Cognitive impairment           Return in about 2 weeks (around 06/09/2023) for Imaging results.   Total time spent: 20 minutes  Luna Fuse, MD  05/26/2023   This document may have been prepared by [facility] Voice Recognition software and as such may include unintentional dictation errors.

## 2023-05-27 LAB — FRUCTOSAMINE: Fructosamine: 381 umol/L — ABNORMAL HIGH (ref 0–285)

## 2023-06-08 ENCOUNTER — Emergency Department
Admission: EM | Admit: 2023-06-08 | Discharge: 2023-06-08 | Disposition: A | Discharge status: Home / Self Care | Payer: No Typology Code available for payment source | Attending: Emergency Medicine | Admitting: Emergency Medicine

## 2023-06-08 ENCOUNTER — Emergency Department: Payer: No Typology Code available for payment source

## 2023-06-08 ENCOUNTER — Other Ambulatory Visit: Payer: Self-pay

## 2023-06-08 DIAGNOSIS — I10 Essential (primary) hypertension: Secondary | ICD-10-CM | POA: Diagnosis not present

## 2023-06-08 DIAGNOSIS — E119 Type 2 diabetes mellitus without complications: Secondary | ICD-10-CM | POA: Diagnosis not present

## 2023-06-08 DIAGNOSIS — M25512 Pain in left shoulder: Secondary | ICD-10-CM | POA: Insufficient documentation

## 2023-06-08 DIAGNOSIS — Y92410 Unspecified street and highway as the place of occurrence of the external cause: Secondary | ICD-10-CM | POA: Diagnosis not present

## 2023-06-08 DIAGNOSIS — M545 Low back pain, unspecified: Secondary | ICD-10-CM | POA: Insufficient documentation

## 2023-06-08 DIAGNOSIS — M546 Pain in thoracic spine: Secondary | ICD-10-CM | POA: Diagnosis not present

## 2023-06-08 DIAGNOSIS — M7918 Myalgia, other site: Secondary | ICD-10-CM

## 2023-06-08 DIAGNOSIS — S161XXA Strain of muscle, fascia and tendon at neck level, initial encounter: Secondary | ICD-10-CM | POA: Insufficient documentation

## 2023-06-08 DIAGNOSIS — S199XXA Unspecified injury of neck, initial encounter: Secondary | ICD-10-CM | POA: Diagnosis present

## 2023-06-08 MED ORDER — OXYCODONE HCL 5 MG PO TABS
5.0000 mg | ORAL_TABLET | Freq: Once | ORAL | Status: AC
  Administered 2023-06-08: 5 mg via ORAL
  Filled 2023-06-08: qty 1

## 2023-06-08 MED ORDER — HYDROCODONE-ACETAMINOPHEN 5-325 MG PO TABS: 1.0000 | ORAL_TABLET | Freq: Four times a day (QID) | ORAL | 0 refills | Status: DC | PRN

## 2023-06-08 NOTE — Discharge Instructions (Signed)
Prescription is at [facility]. Follow-up with your primary care provider if any continued problems.
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
## 2023-06-10 ENCOUNTER — Other Ambulatory Visit: Payer: Medicaid Other

## 2023-06-10 ENCOUNTER — Other Ambulatory Visit: Payer: Self-pay | Admitting: Internal Medicine

## 2023-06-10 DIAGNOSIS — R1084 Generalized abdominal pain: Secondary | ICD-10-CM

## 2023-06-10 DIAGNOSIS — E1142 Type 2 diabetes mellitus with diabetic polyneuropathy: Secondary | ICD-10-CM

## 2023-06-10 DIAGNOSIS — R140 Abdominal distension (gaseous): Secondary | ICD-10-CM

## 2023-06-10 DIAGNOSIS — R600 Localized edema: Secondary | ICD-10-CM

## 2023-06-10 DIAGNOSIS — K439 Ventral hernia without obstruction or gangrene: Secondary | ICD-10-CM

## 2023-06-10 DIAGNOSIS — K56609 Unspecified intestinal obstruction, unspecified as to partial versus complete obstruction: Secondary | ICD-10-CM

## 2023-06-10 DIAGNOSIS — R4189 Other symptoms and signs involving cognitive functions and awareness: Secondary | ICD-10-CM

## 2023-06-24 ENCOUNTER — Encounter: Payer: Self-pay | Admitting: Internal Medicine

## 2023-06-24 ENCOUNTER — Ambulatory Visit: Payer: Medicaid Other | Admitting: Internal Medicine

## 2023-06-24 VITALS — BP 150/60 | HR 90 | Ht 66.0 in | Wt 164.2 lb

## 2023-06-24 DIAGNOSIS — M62838 Other muscle spasm: Secondary | ICD-10-CM

## 2023-06-24 DIAGNOSIS — Z0130 Encounter for examination of blood pressure without abnormal findings: Secondary | ICD-10-CM

## 2023-06-24 DIAGNOSIS — R140 Abdominal distension (gaseous): Secondary | ICD-10-CM | POA: Diagnosis not present

## 2023-06-24 DIAGNOSIS — E1142 Type 2 diabetes mellitus with diabetic polyneuropathy: Secondary | ICD-10-CM | POA: Diagnosis not present

## 2023-06-24 LAB — POCT CBG (FASTING - GLUCOSE)-MANUAL ENTRY: Glucose Fasting, POC: 233 mg/dL — AB (ref 70–99)

## 2023-06-24 MED ORDER — RYBELSUS 14 MG PO TABS: 14.0000 mg | ORAL_TABLET | Freq: Every day | ORAL | 0 refills | Status: DC

## 2023-06-24 MED ORDER — PIOGLITAZONE HCL 45 MG PO TABS: 45.0000 mg | ORAL_TABLET | Freq: Every morning | ORAL | 0 refills | Status: DC

## 2023-06-24 NOTE — Progress Notes (Signed)
 Established Patient Office Visit  Subjective:  Patient ID: Daniel Powell, male    DOB: Dec 29, 1955  Age: 67 y.o. MRN: [id number]  Chief Complaint  Patient presents with   Follow-up    2 week follow up, imaging results.    No new complaints, here for lab review and medication refills. Labs reviewed and notable for uncontrolled diabetes, fructosamine not at target. Denies any hypoglycemic episodes and home bg readings have been high.  Not taking Pioglitazone .    No other concerns at this time.   Past Medical History:  Diagnosis Date   Diabetes mellitus without complication (HCC)    Hypertension     Past Surgical History:  Procedure Laterality Date   BACK SURGERY  2014   COLONOSCOPY WITH PROPOFOL  N/A 03/11/2018   Procedure: COLONOSCOPY WITH PROPOFOL ;  Surgeon: Therisa Bi, MD;  Location: [facility] ENDOSCOPY;  Service: Gastroenterology;  Laterality: N/A;   HERNIA REPAIR      Social History   Socioeconomic History   Marital status: Legally Separated    Spouse name: Not on file   Number of children: 8   Years of education: Not on file   Highest education level: Not on file  Occupational History   Occupation: Unemployed  Tobacco Use   Smoking status: Never   Smokeless tobacco: Never  Vaping Use   Vaping status: Never Used  Substance and Sexual Activity   Alcohol use: No    Alcohol/week: 0.0 standard drinks of alcohol   Drug use: No   Sexual activity: Not Currently    Partners: Female  Other Topics Concern   Not on file  Social History Narrative   Patient and his brother live together-his brother works. Mr. Neville does not work.  Has significant other.        Has 5 adult sons and 3 daughters.      Brother has recently travelled to [place]      Son lives about 2hrs away.   Social Drivers of Corporate Investment Banker Strain: Not on file  Food Insecurity: No Food Insecurity (07/05/2022)   Hunger Vital Sign    Worried About Running Out of Food in the Last Year: Never  true    Ran Out of Food in the Last Year: Never true  Transportation Needs: No Transportation Needs (07/05/2022)   PRAPARE - Administrator, Civil Service (Medical): No    Lack of Transportation (Non-Medical): No  Physical Activity: Inactive (02/10/2018)   Exercise Vital Sign    Days of Exercise per Week: 0 days    Minutes of Exercise per Session: 0 min  Stress: No Stress Concern Present (02/10/2018)   [employer] of Occupational Health - Occupational Stress Questionnaire    Feeling of Stress : Not at all  Social Connections: Moderately Isolated (02/10/2018)   Social Connection and Isolation Panel [NHANES]    Frequency of Communication with Friends and Family: Twice a week    Frequency of Social Gatherings with Friends and Family: Twice a week    Attends Religious Services: Never    Database Administrator or Organizations: No    Attends Banker Meetings: Never    Marital Status: Separated  Intimate Partner Violence: Not At Risk (07/05/2022)   Humiliation, Afraid, Rape, and Kick questionnaire    Fear of Current or Ex-Partner: No    Emotionally Abused: No    Physically Abused: No    Sexually Abused: No    Family History  Problem Relation Age of Onset   Heart attack Mother    Diabetes Mother    Hypertension Mother    Heart disease Father    Diabetes Father    Hypertension Father    Diabetes Sister    Hypertension Sister    Diabetes Son    Diabetes Son    Hip dysplasia Daughter     Allergies  Allergen Reactions   Gadolinium Derivatives Itching    Pt began itching across ribs and back and neck Karrine Kluttz/p Multihance  injection    Outpatient Medications Prior to Visit  Medication Sig   Accu-Chek Softclix Lancets lancets Use as instructed   atorvastatin  (LIPITOR) 40 MG tablet Take 1 tablet (40 mg total) by mouth daily.   empagliflozin  (JARDIANCE ) 25 MG TABS tablet Take 1 tablet (25 mg total) by mouth daily.   glimepiride  (AMARYL ) 4 MG tablet Take 1  tablet (4 mg total) by mouth 2 (two) times daily.   glucose blood (ACCU-CHEK GUIDE) test strip Use as instructed   metFORMIN  (GLUCOPHAGE ) 1000 MG tablet Take 1 tablet (1,000 mg total) by mouth 2 (two) times daily.   valsartan -hydrochlorothiazide  (DIOVAN -HCT) 320-12.5 MG tablet Take 1 tablet by mouth daily.   [DISCONTINUED] Semaglutide  (RYBELSUS ) 7 MG TABS Take 1 tablet (7 mg total) by mouth daily.   [DISCONTINUED] sitaGLIPtin  (JANUVIA ) 100 MG tablet Take 1 tablet by mouth daily.   acetaminophen  (TYLENOL ) 325 MG tablet Take 2 tablets (650 mg total) by mouth every 6 (six) hours as needed for mild pain or headache (fever >/= 101). (Patient not taking: Reported on 06/24/2023)   albuterol  (VENTOLIN  HFA) 108 (90 Base) MCG/ACT inhaler Inhale 2 puffs into the lungs every 6 (six) hours as needed. (Patient not taking: Reported on 06/24/2023)   allopurinol  (ZYLOPRIM ) 300 MG tablet Take 300 mg by mouth daily. (Patient not taking: Reported on 06/24/2023)   furosemide  (LASIX ) 20 MG tablet Take 1 tablet (20 mg total) by mouth daily for 14 days. (Patient not taking: Reported on 06/24/2023)   HYDROcodone -acetaminophen  (NORCO/VICODIN) 5-325 MG tablet Take 1 tablet by mouth every 6 (six) hours as needed. (Patient not taking: Reported on 06/24/2023)   lactulose  (CEPHULAC ) 20 g packet Take 1 packet (20 g total) by mouth daily as needed (constipation). (Patient not taking: Reported on 06/24/2023)   magnesium  hydroxide (MILK OF MAGNESIA) 400 MG/5ML suspension Take 30 mLs by mouth daily as needed for mild constipation. (Patient not taking: Reported on 06/24/2023)   pantoprazole  (PROTONIX ) 40 MG tablet Take 1 tablet (40 mg total) by mouth daily. (Patient not taking: Reported on 06/24/2023)   senna-docusate (SENOKOT-Karra Pink) 8.6-50 MG tablet Take 2 tablets by mouth 2 (two) times daily. Hold this medicine if loose stools (Patient not taking: Reported on 06/24/2023)   traZODone  (DESYREL ) 50 MG tablet Take 0.5-1 tablets (25-50 mg  total) by mouth at bedtime as needed for sleep. (Patient not taking: Reported on 06/24/2023)   [DISCONTINUED] pioglitazone  (ACTOS ) 45 MG tablet Take 1 tablet (45 mg total) by mouth every morning. (Patient not taking: Reported on 06/24/2023)   No facility-administered medications prior to visit.    Review of Systems  Constitutional: Negative.   HENT: Negative.    Eyes: Negative.   Respiratory: Negative.  Negative for shortness of breath.   Cardiovascular: Negative.   Gastrointestinal: Negative.   Genitourinary: Negative.   Skin: Negative.   Neurological: Negative.   Endo/Heme/Allergies: Negative.        Objective:   BP (!) 150/60   Pulse 90  Ht 5' 6 (1.676 m)   Wt 164 lb 3.2 oz (74.5 kg)   SpO2 97%   BMI 26.50 kg/m   Vitals:   06/24/23 1026  BP: (!) 150/60  Pulse: 90  Height: 5' 6 (1.676 m)  Weight: 164 lb 3.2 oz (74.5 kg)  SpO2: 97%  BMI (Calculated): 26.52    Physical Exam Vitals reviewed.  Constitutional:      Appearance: Normal appearance.  HENT:     Head: Normocephalic.     Left Ear: There is no impacted cerumen.     Nose: Nose normal.     Mouth/Throat:     Mouth: Mucous membranes are moist.     Pharynx: No posterior oropharyngeal erythema.  Eyes:     General: Visual field deficit present.     Extraocular Movements: Extraocular movements intact.     Pupils: Pupils are equal, round, and reactive to light.  Cardiovascular:     Rate and Rhythm: Regular rhythm.     Chest Wall: PMI is not displaced.     Pulses: Normal pulses.     Heart sounds: Normal heart sounds. No murmur heard. Pulmonary:     Effort: Pulmonary effort is normal.     Breath sounds: Normal air entry. No rhonchi or rales.  Abdominal:     General: Abdomen is flat. Bowel sounds are normal. There is no distension.     Palpations: Abdomen is soft. There is no hepatomegaly, splenomegaly or mass.     Tenderness: There is no abdominal tenderness.  Musculoskeletal:        General: Normal  range of motion.     Cervical back: Normal range of motion and neck supple.     Right lower leg: No edema.     Left lower leg: No edema.  Skin:    General: Skin is warm and dry.  Neurological:     General: No focal deficit present.     Mental Status: He is alert. He is confused.     Cranial Nerves: Cranial nerve deficit (reduced visual acuity) present.     Motor: No weakness.  Psychiatric:        Mood and Affect: Mood normal.        Behavior: Behavior normal.      Results for orders placed or performed in visit on 06/24/23  POCT CBG (Fasting - Glucose)  Result Value Ref Range   Glucose Fasting, POC 233 (A) 70 - 99 mg/dL    Recent Results (from the past 2160 hours)  Hemoglobin A1c     Status: Abnormal   Collection Time: 04/11/23 10:25 AM  Result Value Ref Range   Hgb A1c MFr Bld 8.2 (H) 4.8 - 5.6 %    Comment:          Prediabetes: 5.7 - 6.4          Diabetes: >6.4          Glycemic control for adults with diabetes: <7.0    Est. average glucose Bld gHb Est-mCnc 189 mg/dL  Comprehensive metabolic panel     Status: Abnormal   Collection Time: 04/11/23 10:25 AM  Result Value Ref Range   Glucose 152 (H) 70 - 99 mg/dL   BUN 25 8 - 27 mg/dL   Creatinine, Ser 8.34 (H) 0.76 - 1.27 mg/dL   eGFR 46 (L) >40 fO/fpw/8.26   BUN/Creatinine Ratio 15 10 - 24   Sodium 140 134 - 144 mmol/L   Potassium 4.7 3.5 -  5.2 mmol/L   Chloride 99 96 - 106 mmol/L   CO2 24 20 - 29 mmol/L   Calcium  9.3 8.6 - 10.2 mg/dL   Total Protein 7.5 6.0 - 8.5 g/dL   Albumin 4.7 3.9 - 4.9 g/dL   Globulin, Total 2.8 1.5 - 4.5 g/dL   Bilirubin Total 0.9 0.0 - 1.2 mg/dL   Alkaline Phosphatase 60 44 - 121 IU/L   AST 17 0 - 40 IU/L   ALT 17 0 - 44 IU/L  Lipid panel     Status: Abnormal   Collection Time: 04/11/23 10:25 AM  Result Value Ref Range   Cholesterol, Total 187 100 - 199 mg/dL   Triglycerides 725 (H) 0 - 149 mg/dL   HDL 35 (L) >60 mg/dL   VLDL Cholesterol Cal 47 (H) 5 - 40 mg/dL   LDL Chol Calc  (NIH) 105 (H) 0 - 99 mg/dL   Chol/HDL Ratio 5.3 (H) 0.0 - 5.0 ratio    Comment:                                   T. Chol/HDL Ratio                                             Men  Women                               1/2 Avg.Risk  3.4    3.3                                   Avg.Risk  5.0    4.4                                2X Avg.Risk  9.6    7.1                                3X Avg.Risk 23.4   11.0   POCT CBG (Fasting - Glucose)     Status: Abnormal   Collection Time: 04/14/23  3:05 PM  Result Value Ref Range   Glucose Fasting, POC 326 (A) 70 - 99 mg/dL  POCT CBG (Fasting - Glucose)     Status: Abnormal   Collection Time: 05/26/23  2:58 PM  Result Value Ref Range   Glucose Fasting, POC 179 (A) 70 - 99 mg/dL  Fructosamine     Status: Abnormal   Collection Time: 05/26/23  3:47 PM  Result Value Ref Range   Fructosamine 381 (H) 0 - 285 umol/L    Comment: Published reference interval for apparently healthy subjects between age 67 and 27 is 40 - 285 umol/L and in a poorly controlled diabetic population is 228 - 563 umol/L with a mean of 396 umol/L.   POCT CBG (Fasting - Glucose)     Status: Abnormal   Collection Time: 06/24/23 10:33 AM  Result Value Ref Range   Glucose Fasting, POC 233 (A) 70 - 99 mg/dL      Assessment & Plan:  As per problem list.  Increase Rybelsus , reorder Pioglitazone  and dc Januvia . Problem List Items Addressed This Visit       Endocrine   Type 2 diabetes mellitus with peripheral neuropathy (HCC) - Primary   Relevant Medications   Semaglutide  (RYBELSUS ) 14 MG TABS   pioglitazone  (ACTOS ) 45 MG tablet   Other Relevant Orders   POCT CBG (Fasting - Glucose) (Completed)   Hemoglobin A1c   Lipid panel   Comprehensive metabolic panel   Other Visit Diagnoses       Abdominal distension (gaseous)           Return in about 6 weeks (around 08/05/2023) for fu with labs prior.   Total time spent: 20 minutes  Sherrill Cinderella Perry,  MD  06/24/2023   This document may have been prepared by [facility] Voice Recognition software and as such may include unintentional dictation errors.

## 2023-06-27 ENCOUNTER — Encounter
Admission: RE | Admit: 2023-06-27 | Discharge: 2023-06-27 | Disposition: A | Discharge status: Home / Self Care | Payer: Medicaid Other | Source: Ambulatory Visit | Attending: Internal Medicine | Admitting: Internal Medicine

## 2023-06-27 DIAGNOSIS — R140 Abdominal distension (gaseous): Secondary | ICD-10-CM | POA: Diagnosis present

## 2023-06-27 MED ORDER — TECHNETIUM TC 99M SULFUR COLLOID
2.3500 | Freq: Once | INTRAVENOUS | Status: AC | PRN
Start: 2023-06-27 — End: 2023-06-27
  Administered 2023-06-27: 2.35 via ORAL

## 2023-06-29 ENCOUNTER — Other Ambulatory Visit: Payer: Self-pay | Admitting: Internal Medicine

## 2023-06-29 DIAGNOSIS — E1142 Type 2 diabetes mellitus with diabetic polyneuropathy: Secondary | ICD-10-CM

## 2023-08-11 ENCOUNTER — Ambulatory Visit: Payer: Medicaid Other | Admitting: Internal Medicine

## 2023-08-11 ENCOUNTER — Encounter: Payer: Self-pay | Admitting: Internal Medicine

## 2023-08-11 ENCOUNTER — Other Ambulatory Visit: Payer: Self-pay

## 2023-08-11 VITALS — BP 121/68 | HR 77 | Temp 98.6°F | Ht 66.0 in | Wt 155.0 lb

## 2023-08-11 DIAGNOSIS — K760 Fatty (change of) liver, not elsewhere classified: Secondary | ICD-10-CM

## 2023-08-11 DIAGNOSIS — E1142 Type 2 diabetes mellitus with diabetic polyneuropathy: Secondary | ICD-10-CM

## 2023-08-11 DIAGNOSIS — R4189 Other symptoms and signs involving cognitive functions and awareness: Secondary | ICD-10-CM

## 2023-08-11 DIAGNOSIS — I10 Essential (primary) hypertension: Secondary | ICD-10-CM

## 2023-08-11 LAB — POCT CBG (FASTING - GLUCOSE)-MANUAL ENTRY: Glucose Fasting, POC: 251 mg/dL — AB (ref 70–99)

## 2023-08-11 MED ORDER — ACCU-CHEK SOFTCLIX LANCETS MISC: 12 refills | Status: DC

## 2023-08-11 MED ORDER — METFORMIN HCL 1000 MG PO TABS: 1000.0000 mg | ORAL_TABLET | Freq: Two times a day (BID) | ORAL | 0 refills | Status: DC

## 2023-08-11 MED ORDER — ACCU-CHEK SOFTCLIX LANCET DEV KIT: 1.0000 | PACK | Freq: Every day | 0 refills | Status: DC

## 2023-08-11 MED ORDER — VALSARTAN-HYDROCHLOROTHIAZIDE 320-12.5 MG PO TABS: 1.0000 | ORAL_TABLET | Freq: Every day | ORAL | 1 refills | Status: DC

## 2023-08-11 NOTE — Progress Notes (Signed)
Established Patient Office Visit  Subjective:  Patient ID: Daniel Powell, male    DOB: 1956/05/04  Age: 68 y.o. MRN: [id number]  Chief Complaint  Patient presents with   Follow-up    Januvia 100mg ? Wants to know if he should be taking this  And wants to know why he'Keontre Defino taking both Glimperiride and poiglitazone     No new complaints, here for lab review and medication refills. Failed to have previsit labs done. Home bg readings still elevated.      No other concerns at this time.   Past Medical History:  Diagnosis Date   Diabetes mellitus without complication (HCC)    Hypertension     Past Surgical History:  Procedure Laterality Date   BACK SURGERY  2014   COLONOSCOPY WITH PROPOFOL N/A 03/11/2018   Procedure: COLONOSCOPY WITH PROPOFOL;  Surgeon: Wyline Mood, MD;  Location: [facility] ENDOSCOPY;  Service: Gastroenterology;  Laterality: N/A;   HERNIA REPAIR      Social History   Socioeconomic History   Marital status: Legally Separated    Spouse name: Not on file   Number of children: 8   Years of education: Not on file   Highest education level: Not on file  Occupational History   Occupation: Unemployed  Tobacco Use   Smoking status: Never   Smokeless tobacco: Never  Vaping Use   Vaping status: Never Used  Substance and Sexual Activity   Alcohol use: No    Alcohol/week: 0.0 standard drinks of alcohol   Drug use: No   Sexual activity: Not Currently    Partners: Female  Other Topics Concern   Not on file  Social History Narrative   Patient and his brother live together-his brother works. Mr. Chanel does not work.  Has significant other.        Has 5 adult sons and 3 daughters.      Brother has recently travelled to [place]      Son lives about 2hrs away.   Social Drivers of Corporate investment banker Strain: Not on file  Food Insecurity: No Food Insecurity (07/05/2022)   Hunger Vital Sign    Worried About Running Out of Food in the Last Year: Never true     Ran Out of Food in the Last Year: Never true  Transportation Needs: No Transportation Needs (07/05/2022)   PRAPARE - Administrator, Civil Service (Medical): No    Lack of Transportation (Non-Medical): No  Physical Activity: Inactive (02/10/2018)   Exercise Vital Sign    Days of Exercise per Week: 0 days    Minutes of Exercise per Session: 0 min  Stress: No Stress Concern Present (02/10/2018)   [employer] of Occupational Health - Occupational Stress Questionnaire    Feeling of Stress : Not at all  Social Connections: Moderately Isolated (02/10/2018)   Social Connection and Isolation Panel [NHANES]    Frequency of Communication with Friends and Family: Twice a week    Frequency of Social Gatherings with Friends and Family: Twice a week    Attends Religious Services: Never    Database administrator or Organizations: No    Attends Banker Meetings: Never    Marital Status: Separated  Intimate Partner Violence: Not At Risk (07/05/2022)   Humiliation, Afraid, Rape, and Kick questionnaire    Fear of Current or Ex-Partner: No    Emotionally Abused: No    Physically Abused: No    Sexually Abused: No  Family History  Problem Relation Age of Onset   Heart attack Mother    Diabetes Mother    Hypertension Mother    Heart disease Father    Diabetes Father    Hypertension Father    Diabetes Sister    Hypertension Sister    Diabetes Son    Diabetes Son    Hip dysplasia Daughter     Allergies  Allergen Reactions   Gadolinium Derivatives Itching    Pt began itching across ribs and back and neck Dustine Bertini/p Multihance injection    Outpatient Medications Prior to Visit  Medication Sig   Accu-Chek Softclix Lancets lancets Use as instructed   atorvastatin (LIPITOR) 40 MG tablet Take 1 tablet (40 mg total) by mouth daily.   empagliflozin (JARDIANCE) 25 MG TABS tablet Take 1 tablet (25 mg total) by mouth daily.   glimepiride (AMARYL) 4 MG tablet Take 1 tablet by  mouth twice daily   glucose blood (ACCU-CHEK GUIDE) test strip Use as instructed   pioglitazone (ACTOS) 45 MG tablet Take 1 tablet (45 mg total) by mouth every morning.   Semaglutide (RYBELSUS) 14 MG TABS Take 1 tablet (14 mg total) by mouth daily.   valsartan-hydrochlorothiazide (DIOVAN-HCT) 320-12.5 MG tablet Take 1 tablet by mouth daily.   acetaminophen (TYLENOL) 325 MG tablet Take 2 tablets (650 mg total) by mouth every 6 (six) hours as needed for mild pain or headache (fever >/= 101). (Patient not taking: Reported on 08/11/2023)   albuterol (VENTOLIN HFA) 108 (90 Base) MCG/ACT inhaler Inhale 2 puffs into the lungs every 6 (six) hours as needed. (Patient not taking: Reported on 08/11/2023)   allopurinol (ZYLOPRIM) 300 MG tablet Take 300 mg by mouth daily. (Patient not taking: Reported on 06/24/2023)   furosemide (LASIX) 20 MG tablet Take 1 tablet (20 mg total) by mouth daily for 14 days. (Patient not taking: Reported on 06/24/2023)   HYDROcodone-acetaminophen (NORCO/VICODIN) 5-325 MG tablet Take 1 tablet by mouth every 6 (six) hours as needed. (Patient not taking: Reported on 08/11/2023)   lactulose (CEPHULAC) 20 g packet Take 1 packet (20 g total) by mouth daily as needed (constipation). (Patient not taking: Reported on 08/11/2023)   magnesium hydroxide (MILK OF MAGNESIA) 400 MG/5ML suspension Take 30 mLs by mouth daily as needed for mild constipation. (Patient not taking: Reported on 08/11/2023)   metFORMIN (GLUCOPHAGE) 1000 MG tablet Take 1 tablet (1,000 mg total) by mouth 2 (two) times daily.   pantoprazole (PROTONIX) 40 MG tablet Take 1 tablet (40 mg total) by mouth daily. (Patient not taking: Reported on 08/11/2023)   senna-docusate (SENOKOT-Lynnetta Tom) 8.6-50 MG tablet Take 2 tablets by mouth 2 (two) times daily. Hold this medicine if loose stools (Patient not taking: Reported on 08/11/2023)   traZODone (DESYREL) 50 MG tablet Take 0.5-1 tablets (25-50 mg total) by mouth at bedtime as needed for sleep.  (Patient not taking: Reported on 06/24/2023)   No facility-administered medications prior to visit.    Review of Systems  Constitutional: Negative.   HENT: Negative.    Eyes: Negative.   Respiratory: Negative.  Negative for shortness of breath.   Cardiovascular: Negative.   Gastrointestinal: Negative.   Genitourinary: Negative.   Skin: Negative.   Neurological: Negative.   Endo/Heme/Allergies: Negative.        Objective:   BP 121/68   Pulse 77   Temp 98.6 F (37 C)   Ht 5\' 6"  (1.676 m)   Wt 155 lb (70.3 kg)   SpO2 97%  BMI 25.02 kg/m   Vitals:   08/11/23 1331  BP: 121/68  Pulse: 77  Temp: 98.6 F (37 C)  Height: 5\' 6"  (1.676 m)  Weight: 155 lb (70.3 kg)  SpO2: 97%  BMI (Calculated): 25.03    Physical Exam Vitals reviewed.  Constitutional:      Appearance: Normal appearance.  HENT:     Head: Normocephalic.     Left Ear: There is no impacted cerumen.     Nose: Nose normal.     Mouth/Throat:     Mouth: Mucous membranes are moist.     Pharynx: No posterior oropharyngeal erythema.  Eyes:     General: Visual field deficit present.     Extraocular Movements: Extraocular movements intact.     Pupils: Pupils are equal, round, and reactive to light.  Cardiovascular:     Rate and Rhythm: Regular rhythm.     Chest Wall: PMI is not displaced.     Pulses: Normal pulses.     Heart sounds: Normal heart sounds. No murmur heard. Pulmonary:     Effort: Pulmonary effort is normal.     Breath sounds: Normal air entry. No rhonchi or rales.  Abdominal:     General: Abdomen is flat. Bowel sounds are normal. There is no distension.     Palpations: Abdomen is soft. There is no hepatomegaly, splenomegaly or mass.     Tenderness: There is no abdominal tenderness.  Musculoskeletal:        General: Normal range of motion.     Cervical back: Normal range of motion and neck supple.     Right lower leg: No edema.     Left lower leg: No edema.  Skin:    General: Skin is  warm and dry.  Neurological:     General: No focal deficit present.     Mental Status: He is alert. He is confused.     Cranial Nerves: Cranial nerve deficit (reduced visual acuity) present.     Motor: No weakness.  Psychiatric:        Mood and Affect: Mood normal.        Behavior: Behavior normal.      Results for orders placed or performed in visit on 08/11/23  POCT CBG (Fasting - Glucose)  Result Value Ref Range   Glucose Fasting, POC 251 (A) 70 - 99 mg/dL    Recent Results (from the past 2160 hours)  POCT CBG (Fasting - Glucose)     Status: Abnormal   Collection Time: 05/26/23  2:58 PM  Result Value Ref Range   Glucose Fasting, POC 179 (A) 70 - 99 mg/dL  Fructosamine     Status: Abnormal   Collection Time: 05/26/23  3:47 PM  Result Value Ref Range   Fructosamine 381 (H) 0 - 285 umol/L    Comment: Published reference interval for apparently healthy subjects between age 87 and 87 is 63 - 285 umol/L and in a poorly controlled diabetic population is 228 - 563 umol/L with a mean of 396 umol/L.   POCT CBG (Fasting - Glucose)     Status: Abnormal   Collection Time: 06/24/23 10:33 AM  Result Value Ref Range   Glucose Fasting, POC 233 (A) 70 - 99 mg/dL  POCT CBG (Fasting - Glucose)     Status: Abnormal   Collection Time: 08/11/23  1:41 PM  Result Value Ref Range   Glucose Fasting, POC 251 (A) 70 - 99 mg/dL      Assessment &  Plan:  As per problem list  Problem List Items Addressed This Visit       Endocrine   Type 2 diabetes mellitus with peripheral neuropathy (HCC) - Primary   Relevant Orders   POCT CBG (Fasting - Glucose) (Completed)   Other Visit Diagnoses       Cognitive impairment         Fatty liver           Return in about 1 week (around 08/18/2023) for lab results.   Total time spent: 20 minutes  Luna Fuse, MD  08/11/2023   This document may have been prepared by [facility] Voice Recognition software and as such may include  unintentional dictation errors.

## 2023-08-15 ENCOUNTER — Other Ambulatory Visit: Payer: Medicaid Other

## 2023-08-15 DIAGNOSIS — E669 Obesity, unspecified: Secondary | ICD-10-CM

## 2023-08-15 DIAGNOSIS — I10 Essential (primary) hypertension: Secondary | ICD-10-CM

## 2023-08-15 DIAGNOSIS — E1142 Type 2 diabetes mellitus with diabetic polyneuropathy: Secondary | ICD-10-CM

## 2023-08-16 LAB — LIPID PANEL
Chol/HDL Ratio: 3.1 {ratio} (ref 0.0–5.0)
Cholesterol, Total: 161 mg/dL (ref 100–199)
HDL: 52 mg/dL (ref >39)
LDL Chol Calc (NIH): 93 mg/dL (ref 0–99)
Triglycerides: 84 mg/dL (ref 0–149)
VLDL Cholesterol Cal: 16 mg/dL (ref 5–40)

## 2023-08-16 LAB — CMP14+EGFR
ALT: 32 [IU]/L (ref 0–44)
AST: 24 [IU]/L (ref 0–40)
Albumin: 4.4 g/dL (ref 3.9–4.9)
Alkaline Phosphatase: 132 [IU]/L — ABNORMAL HIGH (ref 44–121)
BUN/Creatinine Ratio: 10 (ref 10–24)
BUN: 9 mg/dL (ref 8–27)
Bilirubin Total: 0.3 mg/dL (ref 0.0–1.2)
CO2: 24 mmol/L (ref 20–29)
Calcium: 9.9 mg/dL (ref 8.6–10.2)
Chloride: 97 mmol/L (ref 96–106)
Creatinine, Ser: 0.94 mg/dL (ref 0.76–1.27)
Globulin, Total: 2.6 g/dL (ref 1.5–4.5)
Glucose: 111 mg/dL — ABNORMAL HIGH (ref 70–99)
Potassium: 3.7 mmol/L (ref 3.5–5.2)
Sodium: 138 mmol/L (ref 134–144)
Total Protein: 7 g/dL (ref 6.0–8.5)
eGFR: 90 mL/min/{1.73_m2} (ref >59)

## 2023-08-16 LAB — HEMOGLOBIN A1C
Est. average glucose Bld gHb Est-mCnc: 134 mg/dL
Hgb A1c MFr Bld: 6.3 % — ABNORMAL HIGH (ref 4.8–5.6)

## 2023-08-16 LAB — TSH: TSH: 2.52 u[IU]/mL (ref 0.450–4.500)

## 2023-08-18 ENCOUNTER — Ambulatory Visit: Payer: Medicaid Other | Admitting: Internal Medicine

## 2023-08-18 VITALS — BP 142/62 | HR 96 | Ht 66.0 in | Wt 157.0 lb

## 2023-08-18 DIAGNOSIS — R809 Proteinuria, unspecified: Secondary | ICD-10-CM

## 2023-08-18 DIAGNOSIS — I10 Essential (primary) hypertension: Secondary | ICD-10-CM

## 2023-08-18 DIAGNOSIS — E1142 Type 2 diabetes mellitus with diabetic polyneuropathy: Secondary | ICD-10-CM

## 2023-08-18 DIAGNOSIS — E1129 Type 2 diabetes mellitus with other diabetic kidney complication: Secondary | ICD-10-CM | POA: Diagnosis not present

## 2023-08-18 LAB — POCT CBG (FASTING - GLUCOSE)-MANUAL ENTRY: Glucose Fasting, POC: 337 mg/dL — AB (ref 70–99)

## 2023-08-18 MED ORDER — PIOGLITAZONE HCL 45 MG PO TABS: 45.0000 mg | ORAL_TABLET | Freq: Every morning | ORAL | 0 refills | Status: DC

## 2023-08-18 MED ORDER — EMPAGLIFLOZIN 25 MG PO TABS: 25.0000 mg | ORAL_TABLET | Freq: Every day | ORAL | 1 refills | Status: DC

## 2023-08-18 MED ORDER — RYBELSUS 14 MG PO TABS: 14.0000 mg | ORAL_TABLET | Freq: Every day | ORAL | 0 refills | Status: DC

## 2023-08-18 NOTE — Progress Notes (Signed)
 Established Patient Office Visit  Subjective:  Patient ID: Daniel Powell, male    DOB: 25-Apr-1956  Age: 68 y.o. MRN: [id number]  Chief Complaint  Patient presents with   Follow-up    1 Weeks Lab Results    No new complaints, here for lab review and medication refills. Labs reviewed and notable for well controlled diabetes, A1c at target, lipids at target with cmp notable for improvement in renal function. Denies any hypoglycemic episodes and home bg readings have been at target.   No other concerns at this time.   Past Medical History:  Diagnosis Date   Diabetes mellitus without complication (HCC)    Hypertension     Past Surgical History:  Procedure Laterality Date   BACK SURGERY  2014   COLONOSCOPY WITH PROPOFOL N/A 03/11/2018   Procedure: COLONOSCOPY WITH PROPOFOL;  Surgeon: Wyline Mood, MD;  Location: [facility] ENDOSCOPY;  Service: Gastroenterology;  Laterality: N/A;   HERNIA REPAIR      Social History   Socioeconomic History   Marital status: Legally Separated    Spouse name: Not on file   Number of children: 8   Years of education: Not on file   Highest education level: Not on file  Occupational History   Occupation: Unemployed  Tobacco Use   Smoking status: Never   Smokeless tobacco: Never  Vaping Use   Vaping status: Never Used  Substance and Sexual Activity   Alcohol use: No    Alcohol/week: 0.0 standard drinks of alcohol   Drug use: No   Sexual activity: Not Currently    Partners: Female  Other Topics Concern   Not on file  Social History Narrative   Patient and his brother live together-his brother works. Mr. Daniel Powell does not work.  Has significant other.        Has 5 adult sons and 3 daughters.      Brother has recently travelled to [place]      Son lives about 2hrs away.   Social Drivers of Corporate investment banker Strain: Not on file  Food Insecurity: No Food Insecurity (07/05/2022)   Hunger Vital Sign    Worried About Running Out of Food in  the Last Year: Never true    Ran Out of Food in the Last Year: Never true  Transportation Needs: No Transportation Needs (07/05/2022)   PRAPARE - Administrator, Civil Service (Medical): No    Lack of Transportation (Non-Medical): No  Physical Activity: Inactive (02/10/2018)   Exercise Vital Sign    Days of Exercise per Week: 0 days    Minutes of Exercise per Session: 0 min  Stress: No Stress Concern Present (02/10/2018)   [employer] of Occupational Health - Occupational Stress Questionnaire    Feeling of Stress : Not at all  Social Connections: Moderately Isolated (02/10/2018)   Social Connection and Isolation Panel [NHANES]    Frequency of Communication with Friends and Family: Twice a week    Frequency of Social Gatherings with Friends and Family: Twice a week    Attends Religious Services: Never    Database administrator or Organizations: No    Attends Banker Meetings: Never    Marital Status: Separated  Intimate Partner Violence: Not At Risk (07/05/2022)   Humiliation, Afraid, Rape, and Kick questionnaire    Fear of Current or Ex-Partner: No    Emotionally Abused: No    Physically Abused: No    Sexually Abused: No  Family History  Problem Relation Age of Onset   Heart attack Mother    Diabetes Mother    Hypertension Mother    Heart disease Father    Diabetes Father    Hypertension Father    Diabetes Sister    Hypertension Sister    Diabetes Son    Diabetes Son    Hip dysplasia Daughter     Allergies  Allergen Reactions   Gadolinium Derivatives Itching    Pt began itching across ribs and back and neck Daniel Powell/p Multihance injection    Outpatient Medications Prior to Visit  Medication Sig   Accu-Chek Softclix Lancets lancets Use as instructed   acetaminophen (TYLENOL) 325 MG tablet Take 2 tablets (650 mg total) by mouth every 6 (six) hours as needed for mild pain or headache (fever >/= 101). (Patient not taking: Reported on 08/11/2023)    albuterol (VENTOLIN HFA) 108 (90 Base) MCG/ACT inhaler Inhale 2 puffs into the lungs every 6 (six) hours as needed. (Patient not taking: Reported on 08/11/2023)   allopurinol (ZYLOPRIM) 300 MG tablet Take 300 mg by mouth daily. (Patient not taking: Reported on 06/24/2023)   atorvastatin (LIPITOR) 40 MG tablet Take 1 tablet (40 mg total) by mouth daily.   furosemide (LASIX) 20 MG tablet Take 1 tablet (20 mg total) by mouth daily for 14 days. (Patient not taking: Reported on 06/24/2023)   glimepiride (AMARYL) 4 MG tablet Take 1 tablet by mouth twice daily   glucose blood (ACCU-CHEK GUIDE) test strip Use as instructed   HYDROcodone-acetaminophen (NORCO/VICODIN) 5-325 MG tablet Take 1 tablet by mouth every 6 (six) hours as needed. (Patient not taking: Reported on 08/11/2023)   lactulose (CEPHULAC) 20 g packet Take 1 packet (20 g total) by mouth daily as needed (constipation). (Patient not taking: Reported on 08/11/2023)   Lancets Misc. (ACCU-CHEK SOFTCLIX LANCET DEV) KIT 1 Device by Does not apply route daily in the afternoon.   magnesium hydroxide (MILK OF MAGNESIA) 400 MG/5ML suspension Take 30 mLs by mouth daily as needed for mild constipation. (Patient not taking: Reported on 08/11/2023)   metFORMIN (GLUCOPHAGE) 1000 MG tablet Take 1 tablet (1,000 mg total) by mouth 2 (two) times daily.   pantoprazole (PROTONIX) 40 MG tablet Take 1 tablet (40 mg total) by mouth daily. (Patient not taking: Reported on 08/11/2023)   senna-docusate (SENOKOT-Clema Skousen) 8.6-50 MG tablet Take 2 tablets by mouth 2 (two) times daily. Hold this medicine if loose stools (Patient not taking: Reported on 08/11/2023)   traZODone (DESYREL) 50 MG tablet Take 0.5-1 tablets (25-50 mg total) by mouth at bedtime as needed for sleep. (Patient not taking: Reported on 06/24/2023)   valsartan-hydrochlorothiazide (DIOVAN-HCT) 320-12.5 MG tablet Take 1 tablet by mouth daily.   [DISCONTINUED] empagliflozin (JARDIANCE) 25 MG TABS tablet Take 1 tablet (25 mg  total) by mouth daily.   [DISCONTINUED] pioglitazone (ACTOS) 45 MG tablet Take 1 tablet (45 mg total) by mouth every morning.   [DISCONTINUED] Semaglutide (RYBELSUS) 14 MG TABS Take 1 tablet (14 mg total) by mouth daily.   No facility-administered medications prior to visit.    Review of Systems  Constitutional: Negative.   HENT: Negative.    Eyes: Negative.   Respiratory: Negative.  Negative for shortness of breath.   Cardiovascular: Negative.   Gastrointestinal: Negative.   Genitourinary: Negative.   Skin: Negative.   Neurological: Negative.   Endo/Heme/Allergies: Negative.        Objective:   BP (!) 142/62   Pulse 96  Ht 5\' 6"  (1.676 m)   Wt 157 lb (71.2 kg)   SpO2 97%   BMI 25.34 kg/m   Vitals:   08/18/23 1510  BP: (!) 142/62  Pulse: 96  Height: 5\' 6"  (1.676 m)  Weight: 157 lb (71.2 kg)  SpO2: 97%  BMI (Calculated): 25.35    Physical Exam Vitals reviewed.  Constitutional:      Appearance: Normal appearance.  HENT:     Head: Normocephalic.     Left Ear: There is no impacted cerumen.     Nose: Nose normal.     Mouth/Throat:     Mouth: Mucous membranes are moist.     Pharynx: No posterior oropharyngeal erythema.  Eyes:     General: Visual field deficit present.     Extraocular Movements: Extraocular movements intact.     Pupils: Pupils are equal, round, and reactive to light.  Cardiovascular:     Rate and Rhythm: Regular rhythm.     Chest Wall: PMI is not displaced.     Pulses: Normal pulses.     Heart sounds: Normal heart sounds. No murmur heard. Pulmonary:     Effort: Pulmonary effort is normal.     Breath sounds: Normal air entry. No rhonchi or rales.  Abdominal:     General: Abdomen is flat. Bowel sounds are normal. There is no distension.     Palpations: Abdomen is soft. There is no hepatomegaly, splenomegaly or mass.     Tenderness: There is no abdominal tenderness.  Musculoskeletal:        General: Normal range of motion.     Cervical  back: Normal range of motion and neck supple.     Right lower leg: No edema.     Left lower leg: No edema.  Skin:    General: Skin is warm and dry.  Neurological:     General: No focal deficit present.     Mental Status: He is alert. He is confused.     Cranial Nerves: Cranial nerve deficit (reduced visual acuity) present.     Motor: No weakness.  Psychiatric:        Mood and Affect: Mood normal.        Behavior: Behavior normal.      Results for orders placed or performed in visit on 08/18/23  POCT CBG (Fasting - Glucose)  Result Value Ref Range   Glucose Fasting, POC 337 (A) 70 - 99 mg/dL    Recent Results (from the past 2160 hours)  POCT CBG (Fasting - Glucose)     Status: Abnormal   Collection Time: 05/26/23  2:58 PM  Result Value Ref Range   Glucose Fasting, POC 179 (A) 70 - 99 mg/dL  Fructosamine     Status: Abnormal   Collection Time: 05/26/23  3:47 PM  Result Value Ref Range   Fructosamine 381 (H) 0 - 285 umol/L    Comment: Published reference interval for apparently healthy subjects between age 20 and 44 is 30 - 285 umol/L and in a poorly controlled diabetic population is 228 - 563 umol/L with a mean of 396 umol/L.   POCT CBG (Fasting - Glucose)     Status: Abnormal   Collection Time: 06/24/23 10:33 AM  Result Value Ref Range   Glucose Fasting, POC 233 (A) 70 - 99 mg/dL  POCT CBG (Fasting - Glucose)     Status: Abnormal   Collection Time: 08/11/23  1:41 PM  Result Value Ref Range   Glucose Fasting,  POC 251 (A) 70 - 99 mg/dL  Hemoglobin Z6X     Status: Abnormal   Collection Time: 08/15/23 10:32 AM  Result Value Ref Range   Hgb A1c MFr Bld 6.3 (H) 4.8 - 5.6 %    Comment:          Prediabetes: 5.7 - 6.4          Diabetes: >6.4          Glycemic control for adults with diabetes: <7.0    Est. average glucose Bld gHb Est-mCnc 134 mg/dL  TSH     Status: None   Collection Time: 08/15/23 10:32 AM  Result Value Ref Range   TSH 2.520 0.450 - 4.500 uIU/mL   CMP14+EGFR     Status: Abnormal   Collection Time: 08/15/23 10:32 AM  Result Value Ref Range   Glucose 111 (H) 70 - 99 mg/dL   BUN 9 8 - 27 mg/dL   Creatinine, Ser 0.96 0.76 - 1.27 mg/dL   eGFR 90 >04 VW/UJW/1.19   BUN/Creatinine Ratio 10 10 - 24   Sodium 138 134 - 144 mmol/L   Potassium 3.7 3.5 - 5.2 mmol/L   Chloride 97 96 - 106 mmol/L   CO2 24 20 - 29 mmol/L   Calcium 9.9 8.6 - 10.2 mg/dL   Total Protein 7.0 6.0 - 8.5 g/dL   Albumin 4.4 3.9 - 4.9 g/dL   Globulin, Total 2.6 1.5 - 4.5 g/dL   Bilirubin Total 0.3 0.0 - 1.2 mg/dL   Alkaline Phosphatase 132 (H) 44 - 121 IU/L   AST 24 0 - 40 IU/L   ALT 32 0 - 44 IU/L  Lipid panel     Status: None   Collection Time: 08/15/23 10:32 AM  Result Value Ref Range   Cholesterol, Total 161 100 - 199 mg/dL   Triglycerides 84 0 - 149 mg/dL   HDL 52 >14 mg/dL   VLDL Cholesterol Cal 16 5 - 40 mg/dL   LDL Chol Calc (NIH) 93 0 - 99 mg/dL   Chol/HDL Ratio 3.1 0.0 - 5.0 ratio    Comment:                                   T. Chol/HDL Ratio                                             Men  Women                               1/2 Avg.Risk  3.4    3.3                                   Avg.Risk  5.0    4.4                                2X Avg.Risk  9.6    7.1                                3X Avg.Risk 23.4  11.0   POCT CBG (Fasting - Glucose)     Status: Abnormal   Collection Time: 08/18/23  3:11 PM  Result Value Ref Range   Glucose Fasting, POC 337 (A) 70 - 99 mg/dL      Assessment & Plan:  As per problem list  Problem List Items Addressed This Visit       Endocrine   Type 2 diabetes mellitus with peripheral neuropathy (HCC) - Primary   Relevant Medications   empagliflozin (JARDIANCE) 25 MG TABS tablet   pioglitazone (ACTOS) 45 MG tablet   Semaglutide (RYBELSUS) 14 MG TABS   Other Relevant Orders   POCT CBG (Fasting - Glucose) (Completed)   Other Visit Diagnoses       Microalbuminuria due to type 2 diabetes mellitus (HCC)        Relevant Medications   empagliflozin (JARDIANCE) 25 MG TABS tablet   pioglitazone (ACTOS) 45 MG tablet   Semaglutide (RYBELSUS) 14 MG TABS       Return in about 3 months (around 11/15/2023) for fu with labs prior.   Total time spent: 20 minutes  Luna Fuse, MD  08/18/2023   This document may have been prepared by [facility] Voice Recognition software and as such may include unintentional dictation errors.

## 2023-08-26 ENCOUNTER — Ambulatory Visit: Admitting: Internal Medicine

## 2023-08-26 VITALS — BP 126/58 | HR 84 | Temp 97.9°F | Ht 66.0 in | Wt 156.0 lb

## 2023-08-26 DIAGNOSIS — M546 Pain in thoracic spine: Secondary | ICD-10-CM

## 2023-08-26 DIAGNOSIS — I10 Essential (primary) hypertension: Secondary | ICD-10-CM

## 2023-08-26 DIAGNOSIS — M62838 Other muscle spasm: Secondary | ICD-10-CM | POA: Diagnosis not present

## 2023-08-26 MED ORDER — TIZANIDINE HCL 2 MG PO CAPS: 2.0000 mg | ORAL_CAPSULE | Freq: Three times a day (TID) | ORAL | 1 refills | Status: AC | PRN

## 2023-08-26 MED ORDER — NAPROXEN 500 MG PO TABS: 500.0000 mg | ORAL_TABLET | Freq: Two times a day (BID) | ORAL | 2 refills | Status: DC

## 2023-08-26 NOTE — Progress Notes (Signed)
 Established Patient Office Visit  Subjective:  Patient ID: Daniel Powell, male    DOB: 1956/04/29  Age: 68 y.o. MRN: [id number]  Chief Complaint  Patient presents with   Back Pain    Back Pain, Lower back and shoulder blade pain    C/o exacerbation of chronic back pain over the last few weeks. Pain extends from his shoulder to the lower back. Has not used a heating pad or topical agents.    No other concerns at this time.   Past Medical History:  Diagnosis Date   Diabetes mellitus without complication (HCC)    Hypertension     Past Surgical History:  Procedure Laterality Date   BACK SURGERY  2014   COLONOSCOPY WITH PROPOFOL N/A 03/11/2018   Procedure: COLONOSCOPY WITH PROPOFOL;  Surgeon: Wyline Mood, MD;  Location: [facility] ENDOSCOPY;  Service: Gastroenterology;  Laterality: N/A;   HERNIA REPAIR      Social History   Socioeconomic History   Marital status: Legally Separated    Spouse name: Not on file   Number of children: 8   Years of education: Not on file   Highest education level: Not on file  Occupational History   Occupation: Unemployed  Tobacco Use   Smoking status: Never   Smokeless tobacco: Never  Vaping Use   Vaping status: Never Used  Substance and Sexual Activity   Alcohol use: No    Alcohol/week: 0.0 standard drinks of alcohol   Drug use: No   Sexual activity: Not Currently    Partners: Female  Other Topics Concern   Not on file  Social History Narrative   Patient and his brother live together-his brother works. Mr. Daniel Powell does not work.  Has significant other.        Has 5 adult sons and 3 daughters.      Brother has recently travelled to [place]      Son lives about 2hrs away.   Social Drivers of Corporate investment banker Strain: Not on file  Food Insecurity: No Food Insecurity (07/05/2022)   Hunger Vital Sign    Worried About Running Out of Food in the Last Year: Never true    Ran Out of Food in the Last Year: Never true  Transportation  Needs: No Transportation Needs (07/05/2022)   PRAPARE - Administrator, Civil Service (Medical): No    Lack of Transportation (Non-Medical): No  Physical Activity: Inactive (02/10/2018)   Exercise Vital Sign    Days of Exercise per Week: 0 days    Minutes of Exercise per Session: 0 min  Stress: No Stress Concern Present (02/10/2018)   [employer] of Occupational Health - Occupational Stress Questionnaire    Feeling of Stress : Not at all  Social Connections: Moderately Isolated (02/10/2018)   Social Connection and Isolation Panel [NHANES]    Frequency of Communication with Friends and Family: Twice a week    Frequency of Social Gatherings with Friends and Family: Twice a week    Attends Religious Services: Never    Database administrator or Organizations: No    Attends Banker Meetings: Never    Marital Status: Separated  Intimate Partner Violence: Not At Risk (07/05/2022)   Humiliation, Afraid, Rape, and Kick questionnaire    Fear of Current or Ex-Partner: No    Emotionally Abused: No    Physically Abused: No    Sexually Abused: No    Family History  Problem Relation Age  of Onset   Heart attack Mother    Diabetes Mother    Hypertension Mother    Heart disease Father    Diabetes Father    Hypertension Father    Diabetes Sister    Hypertension Sister    Diabetes Son    Diabetes Son    Hip dysplasia Daughter     Allergies  Allergen Reactions   Gadolinium Derivatives Itching    Pt began itching across ribs and back and neck [name]/p Multihance injection    Outpatient Medications Prior to Visit  Medication Sig   Accu-Chek Softclix Lancets lancets Use as instructed   acetaminophen (TYLENOL) 325 MG tablet Take 2 tablets (650 mg total) by mouth every 6 (six) hours as needed for mild pain or headache (fever >/= 101). (Patient not taking: Reported on 08/11/2023)   albuterol (VENTOLIN HFA) 108 (90 Base) MCG/ACT inhaler Inhale 2 puffs into the lungs every 6  (six) hours as needed. (Patient not taking: Reported on 08/11/2023)   allopurinol (ZYLOPRIM) 300 MG tablet Take 300 mg by mouth daily. (Patient not taking: Reported on 06/24/2023)   atorvastatin (LIPITOR) 40 MG tablet Take 1 tablet (40 mg total) by mouth daily.   empagliflozin (JARDIANCE) 25 MG TABS tablet Take 1 tablet (25 mg total) by mouth daily.   glimepiride (AMARYL) 4 MG tablet Take 1 tablet by mouth twice daily   glucose blood (ACCU-CHEK GUIDE) test strip Use as instructed   lactulose (CEPHULAC) 20 g packet Take 1 packet (20 g total) by mouth daily as needed (constipation). (Patient not taking: Reported on 08/11/2023)   Lancets Misc. (ACCU-CHEK SOFTCLIX LANCET DEV) KIT 1 Device by Does not apply route daily in the afternoon.   metFORMIN (GLUCOPHAGE) 1000 MG tablet Take 1 tablet (1,000 mg total) by mouth 2 (two) times daily.   pantoprazole (PROTONIX) 40 MG tablet Take 1 tablet (40 mg total) by mouth daily. (Patient not taking: Reported on 08/11/2023)   pioglitazone (ACTOS) 45 MG tablet Take 1 tablet (45 mg total) by mouth every morning.   Semaglutide (RYBELSUS) 14 MG TABS Take 1 tablet (14 mg total) by mouth daily.   senna-docusate (SENOKOT-Estie Sproule) 8.6-50 MG tablet Take 2 tablets by mouth 2 (two) times daily. Hold this medicine if loose stools (Patient not taking: Reported on 08/11/2023)   valsartan-hydrochlorothiazide (DIOVAN-HCT) 320-12.5 MG tablet Take 1 tablet by mouth daily.   [DISCONTINUED] furosemide (LASIX) 20 MG tablet Take 1 tablet (20 mg total) by mouth daily for 14 days. (Patient not taking: Reported on 06/24/2023)   [DISCONTINUED] HYDROcodone-acetaminophen (NORCO/VICODIN) 5-325 MG tablet Take 1 tablet by mouth every 6 (six) hours as needed. (Patient not taking: Reported on 08/11/2023)   [DISCONTINUED] magnesium hydroxide (MILK OF MAGNESIA) 400 MG/5ML suspension Take 30 mLs by mouth daily as needed for mild constipation. (Patient not taking: Reported on 08/11/2023)   [DISCONTINUED] traZODone  (DESYREL) 50 MG tablet Take 0.5-1 tablets (25-50 mg total) by mouth at bedtime as needed for sleep. (Patient not taking: Reported on 06/24/2023)   No facility-administered medications prior to visit.    Review of Systems  Constitutional: Negative.   HENT: Negative.    Eyes: Negative.   Respiratory: Negative.  Negative for shortness of breath.   Cardiovascular: Negative.   Gastrointestinal: Negative.   Genitourinary: Negative.   Musculoskeletal:  Positive for back pain.  Skin: Negative.   Neurological: Negative.   Endo/Heme/Allergies: Negative.        Objective:   BP (!) 126/58   Pulse 84  Temp 97.9 F (36.6 C) (Tympanic)   Ht 5\' 6"  (1.676 m)   Wt 156 lb (70.8 kg)   SpO2 96%   BMI 25.18 kg/m   Vitals:   08/26/23 1540  BP: (!) 126/58  Pulse: 84  Temp: 97.9 F (36.6 C)  Height: 5\' 6"  (1.676 m)  Weight: 156 lb (70.8 kg)  SpO2: 96%  TempSrc: Tympanic  BMI (Calculated): 25.19    Physical Exam Vitals reviewed.  Constitutional:      Appearance: Normal appearance.  HENT:     Head: Normocephalic.     Left Ear: There is no impacted cerumen.     Nose: Nose normal.     Mouth/Throat:     Mouth: Mucous membranes are moist.     Pharynx: No posterior oropharyngeal erythema.  Eyes:     General: Visual field deficit present.     Extraocular Movements: Extraocular movements intact.     Pupils: Pupils are equal, round, and reactive to light.  Cardiovascular:     Rate and Rhythm: Regular rhythm.     Chest Wall: PMI is not displaced.     Pulses: Normal pulses.     Heart sounds: Normal heart sounds. No murmur heard. Pulmonary:     Effort: Pulmonary effort is normal.     Breath sounds: Normal air entry. No rhonchi or rales.  Abdominal:     General: Abdomen is flat. Bowel sounds are normal. There is no distension.     Palpations: Abdomen is soft. There is no hepatomegaly, splenomegaly or mass.     Tenderness: There is no abdominal tenderness.  Musculoskeletal:         General: Normal range of motion.     Cervical back: Normal range of motion and neck supple.     Right lower leg: No edema.     Left lower leg: No edema.  Skin:    General: Skin is warm and dry.  Neurological:     General: No focal deficit present.     Mental Status: He is alert. He is confused.     Cranial Nerves: Cranial nerve deficit (reduced visual acuity) present.     Motor: No weakness.  Psychiatric:        Mood and Affect: Mood normal.        Behavior: Behavior normal.      No results found for any visits on 08/26/23.      Assessment & Plan:  As per problem list  Problem List Items Addressed This Visit       Musculoskeletal and Integument   Trapezius muscle spasm     Other   Back pain, thoracic - Primary   Relevant Medications   naproxen (NAPROSYN) 500 MG tablet   tizanidine (ZANAFLEX) 2 MG capsule   Other Relevant Orders   Ambulatory referral to Physical Therapy    Return if symptoms worsen or fail to improve.   Total time spent: 30 minutes  Luna Fuse, MD  08/26/2023   This document may have been prepared by [facility] Voice Recognition software and as such may include unintentional dictation errors.

## 2023-11-05 ENCOUNTER — Other Ambulatory Visit: Payer: Self-pay | Admitting: Internal Medicine

## 2023-11-05 DIAGNOSIS — E1142 Type 2 diabetes mellitus with diabetic polyneuropathy: Secondary | ICD-10-CM

## 2023-11-06 ENCOUNTER — Other Ambulatory Visit: Payer: Self-pay | Admitting: Internal Medicine

## 2023-11-06 ENCOUNTER — Telehealth: Payer: Self-pay | Admitting: Internal Medicine

## 2023-11-06 ENCOUNTER — Other Ambulatory Visit: Payer: Self-pay

## 2023-11-06 DIAGNOSIS — E1142 Type 2 diabetes mellitus with diabetic polyneuropathy: Secondary | ICD-10-CM

## 2023-11-06 DIAGNOSIS — E785 Hyperlipidemia, unspecified: Secondary | ICD-10-CM

## 2023-11-06 NOTE — Telephone Encounter (Signed)
 error

## 2023-11-06 NOTE — Telephone Encounter (Signed)
 Pt came by requesting refills, he also wanted refill on rx's allopurinol  300mg  and januvia  100mg  but neither of those two rx's were on his list. Pended other refills, please advise

## 2023-11-07 MED ORDER — GLIMEPIRIDE 4 MG PO TABS: 4.0000 mg | ORAL_TABLET | Freq: Two times a day (BID) | ORAL | 0 refills | Status: DC

## 2023-11-07 MED ORDER — ATORVASTATIN CALCIUM 40 MG PO TABS: 40.0000 mg | ORAL_TABLET | Freq: Every day | ORAL | 1 refills | Status: DC

## 2023-11-07 MED ORDER — METFORMIN HCL 1000 MG PO TABS: 1000.0000 mg | ORAL_TABLET | Freq: Two times a day (BID) | ORAL | 0 refills | Status: DC

## 2023-11-07 MED ORDER — RYBELSUS 14 MG PO TABS: 14.0000 mg | ORAL_TABLET | Freq: Every day | ORAL | 0 refills | Status: DC

## 2023-11-07 MED ORDER — PIOGLITAZONE HCL 45 MG PO TABS: 45.0000 mg | ORAL_TABLET | Freq: Every morning | ORAL | 0 refills | Status: DC

## 2023-11-10 ENCOUNTER — Other Ambulatory Visit: Payer: Self-pay | Admitting: Family Medicine

## 2023-11-10 ENCOUNTER — Other Ambulatory Visit: Payer: Self-pay | Admitting: Internal Medicine

## 2023-11-10 DIAGNOSIS — E1142 Type 2 diabetes mellitus with diabetic polyneuropathy: Secondary | ICD-10-CM

## 2023-11-10 NOTE — Telephone Encounter (Signed)
Was sent to wrong office

## 2023-11-18 ENCOUNTER — Ambulatory Visit: Admitting: Internal Medicine

## 2023-11-18 ENCOUNTER — Encounter: Payer: Self-pay | Admitting: Internal Medicine

## 2023-11-18 VITALS — BP 132/72 | HR 78 | Temp 97.1°F | Ht 65.0 in | Wt 160.0 lb

## 2023-11-18 DIAGNOSIS — R3121 Asymptomatic microscopic hematuria: Secondary | ICD-10-CM

## 2023-11-18 DIAGNOSIS — R351 Nocturia: Secondary | ICD-10-CM | POA: Diagnosis not present

## 2023-11-18 DIAGNOSIS — I10 Essential (primary) hypertension: Secondary | ICD-10-CM

## 2023-11-18 DIAGNOSIS — E1142 Type 2 diabetes mellitus with diabetic polyneuropathy: Secondary | ICD-10-CM

## 2023-11-18 DIAGNOSIS — N401 Enlarged prostate with lower urinary tract symptoms: Secondary | ICD-10-CM | POA: Insufficient documentation

## 2023-11-18 DIAGNOSIS — M545 Low back pain, unspecified: Secondary | ICD-10-CM

## 2023-11-18 DIAGNOSIS — J4520 Mild intermittent asthma, uncomplicated: Secondary | ICD-10-CM | POA: Diagnosis not present

## 2023-11-18 LAB — POCT URINALYSIS DIPSTICK
Bilirubin, UA: NEGATIVE
Blood, UA: POSITIVE
Glucose, UA: POSITIVE — AB
Ketones, UA: NEGATIVE
Leukocytes, UA: NEGATIVE
Nitrite, UA: NEGATIVE
Protein, UA: NEGATIVE
Spec Grav, UA: 1.015 (ref 1.010–1.025)
Urobilinogen, UA: 0.2 U/dL
pH, UA: 6 (ref 5.0–8.0)

## 2023-11-18 LAB — POCT CBG (FASTING - GLUCOSE)-MANUAL ENTRY: Glucose Fasting, POC: 172 mg/dL — AB (ref 70–99)

## 2023-11-18 MED ORDER — MELOXICAM 15 MG PO TABS
15.0000 mg | ORAL_TABLET | Freq: Every day | ORAL | 0 refills | Status: DC
Start: 2023-11-18 — End: 2023-12-02

## 2023-11-18 MED ORDER — ALBUTEROL SULFATE HFA 108 (90 BASE) MCG/ACT IN AERS: 2.0000 | INHALATION_SPRAY | Freq: Four times a day (QID) | RESPIRATORY_TRACT | 1 refills | Status: DC | PRN

## 2023-11-18 MED ORDER — TAMSULOSIN HCL 0.4 MG PO CAPS: 0.4000 mg | ORAL_CAPSULE | Freq: Every day | ORAL | 1 refills | Status: DC

## 2023-11-18 NOTE — Progress Notes (Signed)
 Established Patient Office Visit  Subjective:  Patient ID: Daniel Powell, male    DOB: March 30, 1956  Age: 68 y.o. MRN: [id number]  Chief Complaint  Patient presents with   Results    3 month lab results    C/o nocturia also here for lab review and medication refills. Failed to have previsit labs done. Still c/o back pain partially relieved by nsaids. PT completed but unhelpful.    No other concerns at this time.   Past Medical History:  Diagnosis Date   Diabetes mellitus without complication (HCC)    Hypertension     Past Surgical History:  Procedure Laterality Date   BACK SURGERY  2014   COLONOSCOPY WITH PROPOFOL  N/A 03/11/2018   Procedure: COLONOSCOPY WITH PROPOFOL ;  Surgeon: Luke Salaam, MD;  Location: [facility] ENDOSCOPY;  Service: Gastroenterology;  Laterality: N/A;   HERNIA REPAIR      Social History   Socioeconomic History   Marital status: Legally Separated    Spouse name: Not on file   Number of children: 8   Years of education: Not on file   Highest education level: Not on file  Occupational History   Occupation: Unemployed  Tobacco Use   Smoking status: Never   Smokeless tobacco: Never  Vaping Use   Vaping status: Never Used  Substance and Sexual Activity   Alcohol use: No    Alcohol/week: 0.0 standard drinks of alcohol   Drug use: No   Sexual activity: Not Currently    Partners: Female  Other Topics Concern   Not on file  Social History Narrative   Patient and his brother live together-his brother works. Mr. Harun does not work.  Has significant other.        Has 5 adult sons and 3 daughters.      Brother has recently travelled to [place]      Son lives about 2hrs away.   Social Drivers of Corporate investment banker Strain: Not on file  Food Insecurity: No Food Insecurity (07/05/2022)   Hunger Vital Sign    Worried About Running Out of Food in the Last Year: Never true    Ran Out of Food in the Last Year: Never true  Transportation Needs: No  Transportation Needs (07/05/2022)   PRAPARE - Administrator, Civil Service (Medical): No    Lack of Transportation (Non-Medical): No  Physical Activity: Inactive (02/10/2018)   Exercise Vital Sign    Days of Exercise per Week: 0 days    Minutes of Exercise per Session: 0 min  Stress: No Stress Concern Present (02/10/2018)   [employer] of Occupational Health - Occupational Stress Questionnaire    Feeling of Stress : Not at all  Social Connections: Moderately Isolated (02/10/2018)   Social Connection and Isolation Panel [NHANES]    Frequency of Communication with Friends and Family: Twice a week    Frequency of Social Gatherings with Friends and Family: Twice a week    Attends Religious Services: Never    Database administrator or Organizations: No    Attends Banker Meetings: Never    Marital Status: Separated  Intimate Partner Violence: Not At Risk (07/05/2022)   Humiliation, Afraid, Rape, and Kick questionnaire    Fear of Current or Ex-Partner: No    Emotionally Abused: No    Physically Abused: No    Sexually Abused: No    Family History  Problem Relation Age of Onset   Heart attack  Mother    Diabetes Mother    Hypertension Mother    Heart disease Father    Diabetes Father    Hypertension Father    Diabetes Sister    Hypertension Sister    Diabetes Son    Diabetes Son    Hip dysplasia Daughter     Allergies  Allergen Reactions   Gadolinium Derivatives Itching    Pt began itching across ribs and back and neck [employer] Fuerte/p Multihance  injection    Outpatient Medications Prior to Visit  Medication Sig   Accu-Chek Softclix Lancets lancets Use as instructed   allopurinol  (ZYLOPRIM ) 300 MG tablet Take 1 tablet by mouth once daily   atorvastatin  (LIPITOR) 40 MG tablet Take 1 tablet (40 mg total) by mouth daily.   empagliflozin  (JARDIANCE ) 25 MG TABS tablet Take 1 tablet (25 mg total) by mouth daily.   glimepiride  (AMARYL ) 4 MG tablet Take 1 tablet (4  mg total) by mouth 2 (two) times daily.   glucose blood (ACCU-CHEK GUIDE) test strip Use as instructed   Lancets Misc. (ACCU-CHEK SOFTCLIX LANCET DEV) KIT 1 Device by Does not apply route daily in the afternoon.   metFORMIN  (GLUCOPHAGE ) 1000 MG tablet Take 1 tablet (1,000 mg total) by mouth 2 (two) times daily.   pioglitazone  (ACTOS ) 45 MG tablet Take 1 tablet (45 mg total) by mouth every morning.   Semaglutide  (RYBELSUS ) 14 MG TABS Take 1 tablet (14 mg total) by mouth daily.   valsartan -hydrochlorothiazide  (DIOVAN -HCT) 320-25 MG tablet Take 1 tablet by mouth once daily   [DISCONTINUED] JANUVIA  100 MG tablet Take 100 mg by mouth daily.   acetaminophen  (TYLENOL ) 325 MG tablet Take 2 tablets (650 mg total) by mouth every 6 (six) hours as needed for mild pain or headache (fever >/= 101). (Patient not taking: Reported on 06/24/2023)   lactulose  (CEPHULAC ) 20 g packet Take 1 packet (20 g total) by mouth daily as needed (constipation). (Patient not taking: Reported on 06/24/2023)   pantoprazole  (PROTONIX ) 40 MG tablet Take 1 tablet (40 mg total) by mouth daily. (Patient not taking: Reported on 06/24/2023)   senna-docusate (SENOKOT-[place]ray Reza) 8.6-50 MG tablet Take 2 tablets by mouth 2 (two) times daily. Hold this medicine if loose stools (Patient not taking: Reported on 06/24/2023)   [DISCONTINUED] albuterol  (VENTOLIN  HFA) 108 (90 Base) MCG/ACT inhaler Inhale 2 puffs into the lungs every 6 (six) hours as needed. (Patient not taking: Reported on 08/11/2023)   [DISCONTINUED] naproxen  (NAPROSYN ) 500 MG tablet Take 1 tablet (500 mg total) by mouth 2 (two) times daily with a meal. (Patient not taking: Reported on 11/18/2023)   [DISCONTINUED] valsartan -hydrochlorothiazide  (DIOVAN -HCT) 320-12.5 MG tablet Take 1 tablet by mouth daily. (Patient not taking: Reported on 11/18/2023)   No facility-administered medications prior to visit.    Review of Systems  Constitutional: Negative.   HENT: Negative.    Eyes: Negative.    Respiratory: Negative.  Negative for shortness of breath.   Cardiovascular: Negative.   Gastrointestinal: Negative.   Genitourinary: Negative.   Musculoskeletal:  Positive for back pain.  Skin: Negative.   Neurological: Negative.   Endo/Heme/Allergies: Negative.        Objective:   BP 132/72   Pulse 78   Temp (!) 97.1 F (36.2 C)   Ht 5\' 5"  (1.651 m)   Wt 160 lb (72.6 kg)   SpO2 100%   BMI 26.63 kg/m   Vitals:   11/18/23 1432  BP: 132/72  Pulse: 78  Temp: (!) 97.1 F (36.2  C)  Height: 5\' 5"  (1.651 m)  Weight: 160 lb (72.6 kg)  SpO2: 100%  BMI (Calculated): 26.63    Physical Exam Vitals reviewed.  Constitutional:      Appearance: Normal appearance.  HENT:     Head: Normocephalic.     Left Ear: There is no impacted cerumen.     Nose: Nose normal.     Mouth/Throat:     Mouth: Mucous membranes are moist.     Pharynx: No posterior oropharyngeal erythema.  Eyes:     General: Visual field deficit present.     Extraocular Movements: Extraocular movements intact.     Pupils: Pupils are equal, round, and reactive to light.  Cardiovascular:     Rate and Rhythm: Regular rhythm.     Chest Wall: PMI is not displaced.     Pulses: Normal pulses.     Heart sounds: Normal heart sounds. No murmur heard. Pulmonary:     Effort: Pulmonary effort is normal.     Breath sounds: Normal air entry. No rhonchi or rales.  Abdominal:     General: Abdomen is flat. Bowel sounds are normal. There is no distension.     Palpations: Abdomen is soft. There is no hepatomegaly, splenomegaly or mass.     Tenderness: There is no abdominal tenderness.  Musculoskeletal:     Cervical back: Normal range of motion and neck supple.     Lumbar back: Positive right straight leg raise test and positive left straight leg raise test.     Right lower leg: No edema.     Left lower leg: No edema.  Skin:    General: Skin is warm and dry.  Neurological:     General: No focal deficit present.      Mental Status: He is alert. He is confused.     Cranial Nerves: Cranial nerve deficit (reduced visual acuity) present.     Motor: No weakness.  Psychiatric:        Mood and Affect: Mood normal.        Behavior: Behavior normal.      Results for orders placed or performed in visit on 11/18/23  POCT CBG (Fasting - Glucose)  Result Value Ref Range   Glucose Fasting, POC 172 (A) 70 - 99 mg/dL    Recent Results (from the past 2160 hours)  POCT CBG (Fasting - Glucose)     Status: Abnormal   Collection Time: 11/18/23  2:50 PM  Result Value Ref Range   Glucose Fasting, POC 172 (A) 70 - 99 mg/dL      Assessment & Plan:  As per problem list  Problem List Items Addressed This Visit       Endocrine   Type 2 diabetes mellitus with peripheral neuropathy (HCC) - Primary   Relevant Orders   POCT CBG (Fasting - Glucose) (Completed)   Other Visit Diagnoses       Mild intermittent asthma without complication       Relevant Medications   albuterol  (VENTOLIN  HFA) 108 (90 Base) MCG/ACT inhaler     Benign prostatic hyperplasia with nocturia       Relevant Medications   tamsulosin (FLOMAX) 0.4 MG CAPS capsule   Other Relevant Orders   POCT Urinalysis Dipstick (81002)     Lumbar pain       Relevant Medications   meloxicam (MOBIC) 15 MG tablet   Other Relevant Orders   Ambulatory referral to Pain Clinic       Return  in about 2 weeks (around 12/02/2023) for fu with labs prior.   Total time spent: 30 minutes  Arzella Bitters, MD  11/18/2023   This document may have been prepared by [facility] Voice Recognition software and as such may include unintentional dictation errors.

## 2023-11-28 ENCOUNTER — Other Ambulatory Visit

## 2023-11-29 LAB — LIPID PANEL
Chol/HDL Ratio: 6 ratio — ABNORMAL HIGH (ref 0.0–5.0)
Cholesterol, Total: 168 mg/dL (ref 100–199)
HDL: 28 mg/dL — ABNORMAL LOW (ref >39)
LDL Chol Calc (NIH): 81 mg/dL (ref 0–99)
Triglycerides: 361 mg/dL — ABNORMAL HIGH (ref 0–149)
VLDL Cholesterol Cal: 59 mg/dL — ABNORMAL HIGH (ref 5–40)

## 2023-11-29 LAB — COMPREHENSIVE METABOLIC PANEL WITH GFR
ALT: 14 IU/L (ref 0–44)
AST: 17 IU/L (ref 0–40)
Albumin: 4.4 g/dL (ref 3.9–4.9)
Alkaline Phosphatase: 54 IU/L (ref 44–121)
BUN/Creatinine Ratio: 19 (ref 10–24)
BUN: 32 mg/dL — ABNORMAL HIGH (ref 8–27)
Bilirubin Total: 0.8 mg/dL (ref 0.0–1.2)
CO2: 22 mmol/L (ref 20–29)
Calcium: 8.8 mg/dL (ref 8.6–10.2)
Chloride: 102 mmol/L (ref 96–106)
Creatinine, Ser: 1.68 mg/dL — ABNORMAL HIGH (ref 0.76–1.27)
Globulin, Total: 2.5 g/dL (ref 1.5–4.5)
Glucose: 217 mg/dL — ABNORMAL HIGH (ref 70–99)
Potassium: 5.3 mmol/L — ABNORMAL HIGH (ref 3.5–5.2)
Sodium: 139 mmol/L (ref 134–144)
Total Protein: 6.9 g/dL (ref 6.0–8.5)
eGFR: 45 mL/min/{1.73_m2} — ABNORMAL LOW (ref >59)

## 2023-11-29 LAB — HEMOGLOBIN A1C
Est. average glucose Bld gHb Est-mCnc: 166 mg/dL
Hgb A1c MFr Bld: 7.4 % — ABNORMAL HIGH (ref 4.8–5.6)

## 2023-12-02 ENCOUNTER — Ambulatory Visit: Admitting: Internal Medicine

## 2023-12-02 ENCOUNTER — Ambulatory Visit: Payer: Self-pay | Admitting: Internal Medicine

## 2023-12-02 VITALS — BP 132/60 | HR 69 | Temp 96.9°F | Ht 65.0 in | Wt 168.4 lb

## 2023-12-02 DIAGNOSIS — E875 Hyperkalemia: Secondary | ICD-10-CM

## 2023-12-02 DIAGNOSIS — N1831 Chronic kidney disease, stage 3a: Secondary | ICD-10-CM | POA: Diagnosis not present

## 2023-12-02 DIAGNOSIS — E1142 Type 2 diabetes mellitus with diabetic polyneuropathy: Secondary | ICD-10-CM

## 2023-12-02 DIAGNOSIS — I10 Essential (primary) hypertension: Secondary | ICD-10-CM | POA: Diagnosis not present

## 2023-12-02 LAB — POCT CBG (FASTING - GLUCOSE)-MANUAL ENTRY: Glucose Fasting, POC: 338 mg/dL — AB (ref 70–99)

## 2023-12-02 MED ORDER — VALSARTAN 320 MG PO TABS: 320.0000 mg | ORAL_TABLET | Freq: Every day | ORAL | 0 refills | Status: DC

## 2023-12-02 MED ORDER — LOKELMA 10 G PO PACK: 10.0000 g | PACK | Freq: Three times a day (TID) | ORAL | 0 refills | Status: AC

## 2023-12-02 NOTE — Progress Notes (Signed)
 Established Patient Office Visit  Subjective:  Patient ID: Daniel Powell, male    DOB: [birth year]  Age: 68 y.o. MRN: [id number]  Chief Complaint  Patient presents with   Follow-up    2 weeks follow up    No new complaints, here for lab review and medication refills. Labs reviewed and notable for uncontrolled diabetes, A1c not at target, LDL not at target with cmp notable for deterioration in renal function and hyperkalemia. Unclear if he'Daniel Powell taking Rybelsus  as he'Daniel Powell gaining weight.    No other concerns at this time.   Past Medical History:  Diagnosis Date   Diabetes mellitus without complication (HCC)    Hypertension     Past Surgical History:  Procedure Laterality Date   BACK SURGERY  2014   COLONOSCOPY WITH PROPOFOL  N/A 03/11/2018   Procedure: COLONOSCOPY WITH PROPOFOL ;  Surgeon: Luke Salaam, MD;  Location: [facility] ENDOSCOPY;  Service: Gastroenterology;  Laterality: N/A;   HERNIA REPAIR      Social History   Socioeconomic History   Marital status: Legally Separated    Spouse name: Not on file   Number of children: 8   Years of education: Not on file   Highest education level: Not on file  Occupational History   Occupation: Unemployed  Tobacco Use   Smoking status: Never   Smokeless tobacco: Never  Vaping Use   Vaping status: Never Used  Substance and Sexual Activity   Alcohol use: No    Alcohol/week: 0.0 standard drinks of alcohol   Drug use: No   Sexual activity: Not Currently    Partners: Female  Other Topics Concern   Not on file  Social History Narrative   Patient and his brother live together-his brother works. Mr. Daniel Powell does not work.  Has significant other.        Has 5 adult sons and 3 daughters.      Brother has recently travelled to [place]      Son lives about 2hrs away.   Social Drivers of Corporate investment banker Strain: Not on file  Food Insecurity: No Food Insecurity (07/05/2022)   Hunger Vital Sign    Worried About Running Out of Food  in the Last Year: Never true    Ran Out of Food in the Last Year: Never true  Transportation Needs: No Transportation Needs (07/05/2022)   PRAPARE - Administrator, Civil Service (Medical): No    Lack of Transportation (Non-Medical): No  Physical Activity: Inactive (02/10/2018)   Exercise Vital Sign    Days of Exercise per Week: 0 days    Minutes of Exercise per Session: 0 min  Stress: No Stress Concern Present (02/10/2018)   [employer] of Occupational Health - Occupational Stress Questionnaire    Feeling of Stress : Not at all  Social Connections: Moderately Isolated (02/10/2018)   Social Connection and Isolation Panel [NHANES]    Frequency of Communication with Friends and Family: Twice a week    Frequency of Social Gatherings with Friends and Family: Twice a week    Attends Religious Services: Never    Database administrator or Organizations: No    Attends Banker Meetings: Never    Marital Status: Separated  Intimate Partner Violence: Not At Risk (07/05/2022)   Humiliation, Afraid, Rape, and Kick questionnaire    Fear of Current or Ex-Partner: No    Emotionally Abused: No    Physically Abused: No    Sexually Abused:  No    Family History  Problem Relation Age of Onset   Heart attack Mother    Diabetes Mother    Hypertension Mother    Heart disease Father    Diabetes Father    Hypertension Father    Diabetes Sister    Hypertension Sister    Diabetes Son    Diabetes Son    Hip dysplasia Daughter     Allergies  Allergen Reactions   Gadolinium Derivatives Itching    Pt began itching across ribs and back and neck Araceli Coufal/p Multihance  injection    Outpatient Medications Prior to Visit  Medication Sig Note   Accu-Chek Softclix Lancets lancets Use as instructed    acetaminophen  (TYLENOL ) 325 MG tablet Take 2 tablets (650 mg total) by mouth every 6 (six) hours as needed for mild pain or headache (fever >/= 101). (Patient not taking: Reported on  06/24/2023)    albuterol  (VENTOLIN  HFA) 108 (90 Base) MCG/ACT inhaler Inhale 2 puffs into the lungs every 6 (six) hours as needed.    allopurinol  (ZYLOPRIM ) 300 MG tablet Take 1 tablet by mouth once daily    atorvastatin  (LIPITOR) 40 MG tablet Take 1 tablet (40 mg total) by mouth daily.    empagliflozin  (JARDIANCE ) 25 MG TABS tablet Take 1 tablet (25 mg total) by mouth daily.    glimepiride  (AMARYL ) 4 MG tablet Take 1 tablet (4 mg total) by mouth 2 (two) times daily.    glucose blood (ACCU-CHEK GUIDE) test strip Use as instructed    lactulose  (CEPHULAC ) 20 g packet Take 1 packet (20 g total) by mouth daily as needed (constipation). (Patient not taking: Reported on 06/24/2023)    Lancets Misc. (ACCU-CHEK SOFTCLIX LANCET DEV) KIT 1 Device by Does not apply route daily in the afternoon.    metFORMIN  (GLUCOPHAGE ) 1000 MG tablet Take 1 tablet (1,000 mg total) by mouth 2 (two) times daily.    pantoprazole  (PROTONIX ) 40 MG tablet Take 1 tablet (40 mg total) by mouth daily. (Patient not taking: Reported on 06/24/2023)    pioglitazone  (ACTOS ) 45 MG tablet Take 1 tablet (45 mg total) by mouth every morning.    Semaglutide  (RYBELSUS ) 14 MG TABS Take 1 tablet (14 mg total) by mouth daily.    senna-docusate (SENOKOT-[place]) 8.6-50 MG tablet Take 2 tablets by mouth 2 (two) times daily. Hold this medicine if loose stools (Patient not taking: Reported on 06/24/2023)    tamsulosin  (FLOMAX ) 0.4 MG CAPS capsule Take 1 capsule (0.4 mg total) by mouth daily after supper.    [DISCONTINUED] meloxicam  (MOBIC ) 15 MG tablet Take 1 tablet (15 mg total) by mouth daily. 12/02/2023: aki   [DISCONTINUED] valsartan -hydrochlorothiazide  (DIOVAN -HCT) 320-25 MG tablet Take 1 tablet by mouth once daily    No facility-administered medications prior to visit.    Review of Systems  Constitutional:  Negative for weight loss (gained 8 lbs).       Objective:   BP 132/60   Pulse 69   Temp (!) 96.9 F (36.1 C) (Tympanic)   Ht 5\' 5"   (1.651 m)   Wt 168 lb 6.4 oz (76.4 kg)   SpO2 98%   BMI 28.02 kg/m   Vitals:   12/02/23 1328  BP: 132/60  Pulse: 69  Temp: (!) 96.9 F (36.1 C)  Height: 5\' 5"  (1.651 m)  Weight: 168 lb 6.4 oz (76.4 kg)  SpO2: 98%  TempSrc: Tympanic  BMI (Calculated): 28.02    Physical Exam Vitals reviewed.  Constitutional:  Appearance: Normal appearance.  HENT:     Head: Normocephalic.     Left Ear: There is no impacted cerumen.     Nose: Nose normal.     Mouth/Throat:     Mouth: Mucous membranes are moist.     Pharynx: No posterior oropharyngeal erythema.  Eyes:     General: Visual field deficit present.     Extraocular Movements: Extraocular movements intact.     Pupils: Pupils are equal, round, and reactive to light.  Cardiovascular:     Rate and Rhythm: Regular rhythm.     Chest Wall: PMI is not displaced.     Pulses: Normal pulses.     Heart sounds: Normal heart sounds. No murmur heard. Pulmonary:     Effort: Pulmonary effort is normal.     Breath sounds: Normal air entry. No rhonchi or rales.  Abdominal:     General: Abdomen is flat. Bowel sounds are normal. There is no distension.     Palpations: Abdomen is soft. There is no hepatomegaly, splenomegaly or mass.     Tenderness: There is no abdominal tenderness.  Musculoskeletal:     Cervical back: Normal range of motion and neck supple.     Lumbar back: Positive right straight leg raise test and positive left straight leg raise test.     Right lower leg: 1+ Edema present.     Left lower leg: 1+ Edema present.  Skin:    General: Skin is warm and dry.  Neurological:     General: No focal deficit present.     Mental Status: He is alert. He is confused.     Cranial Nerves: Cranial nerve deficit (reduced visual acuity) present.     Motor: No weakness.  Psychiatric:        Mood and Affect: Mood normal.        Behavior: Behavior normal.      Results for orders placed or performed in visit on 12/02/23  POCT CBG  (Fasting - Glucose)  Result Value Ref Range   Glucose Fasting, POC 338 (A) 70 - 99 mg/dL    Recent Results (from the past 2160 hours)  POCT CBG (Fasting - Glucose)     Status: Abnormal   Collection Time: 11/18/23  2:50 PM  Result Value Ref Range   Glucose Fasting, POC 172 (A) 70 - 99 mg/dL  POCT Urinalysis Dipstick (40981)     Status: Abnormal   Collection Time: 11/18/23  3:27 PM  Result Value Ref Range   Color, UA     Clarity, UA     Glucose, UA Positive (A) Negative   Bilirubin, UA Negative    Ketones, UA Negative    Spec Grav, UA 1.015 1.010 - 1.025   Blood, UA Positive    pH, UA 6.0 5.0 - 8.0   Protein, UA Negative Negative   Urobilinogen, UA 0.2 0.2 or 1.0 E.U./dL   Nitrite, UA Negative    Leukocytes, UA Negative Negative   Appearance     Odor    Hemoglobin A1c     Status: Abnormal   Collection Time: 11/28/23  9:44 AM  Result Value Ref Range   Hgb A1c MFr Bld 7.4 (H) 4.8 - 5.6 %    Comment:          Prediabetes: 5.7 - 6.4          Diabetes: >6.4          Glycemic control for adults with diabetes: <7.0  Est. average glucose Bld gHb Est-mCnc 166 mg/dL  Lipid panel     Status: Abnormal   Collection Time: 11/28/23  9:44 AM  Result Value Ref Range   Cholesterol, Total 168 100 - 199 mg/dL   Triglycerides 027 (H) 0 - 149 mg/dL   HDL 28 (L) >25 mg/dL   VLDL Cholesterol Cal 59 (H) 5 - 40 mg/dL   LDL Chol Calc (NIH) 81 0 - 99 mg/dL   Chol/HDL Ratio 6.0 (H) 0.0 - 5.0 ratio    Comment:                                   T. Chol/HDL Ratio                                             Men  Women                               1/2 Avg.Risk  3.4    3.3                                   Avg.Risk  5.0    4.4                                2X Avg.Risk  9.6    7.1                                3X Avg.Risk 23.4   11.0   Comprehensive metabolic panel     Status: Abnormal   Collection Time: 11/28/23  9:44 AM  Result Value Ref Range   Glucose 217 (H) 70 - 99 mg/dL   BUN 32 (H) 8 -  27 mg/dL   Creatinine, Ser 3.66 (H) 0.76 - 1.27 mg/dL   eGFR 45 (L) >44 IH/KVQ/2.59   BUN/Creatinine Ratio 19 10 - 24   Sodium 139 134 - 144 mmol/L   Potassium 5.3 (H) 3.5 - 5.2 mmol/L   Chloride 102 96 - 106 mmol/L   CO2 22 20 - 29 mmol/L   Calcium  8.8 8.6 - 10.2 mg/dL   Total Protein 6.9 6.0 - 8.5 g/dL   Albumin 4.4 3.9 - 4.9 g/dL   Globulin, Total 2.5 1.5 - 4.5 g/dL   Bilirubin Total 0.8 0.0 - 1.2 mg/dL   Alkaline Phosphatase 54 44 - 121 IU/L   AST 17 0 - 40 IU/L   ALT 14 0 - 44 IU/L  POCT CBG (Fasting - Glucose)     Status: Abnormal   Collection Time: 12/02/23  1:32 PM  Result Value Ref Range   Glucose Fasting, POC 338 (A) 70 - 99 mg/dL      Assessment & Plan:  As per problem list. Dc hydrochlorothiazide  and nsaids, repeat bmp in two weeks. Instructed to bring his meds with him to his next appt.  Problem List Items Addressed This Visit       Cardiovascular and Mediastinum   HTN (hypertension), benign   Relevant Medications   valsartan  (DIOVAN ) 320 MG tablet  Endocrine   Type 2 diabetes mellitus with peripheral neuropathy (HCC) - Primary   Relevant Medications   valsartan  (DIOVAN ) 320 MG tablet   Other Relevant Orders   POCT CBG (Fasting - Glucose) (Completed)   Other Visit Diagnoses       Hyperkalemia       Relevant Medications   sodium zirconium cyclosilicate (LOKELMA) 10 g PACK packet     Stage 3a chronic kidney disease (HCC)       Relevant Orders   BMP8+Anion Gap       Return in about 2 weeks (around 12/16/2023) for fu with labs prior.   Total time spent: 20 minutes  Arzella Bitters, MD  12/02/2023   This document may have been prepared by [facility] Voice Recognition software and as such may include unintentional dictation errors.

## 2023-12-04 ENCOUNTER — Other Ambulatory Visit: Payer: Self-pay | Admitting: Internal Medicine

## 2023-12-04 DIAGNOSIS — E1142 Type 2 diabetes mellitus with diabetic polyneuropathy: Secondary | ICD-10-CM

## 2023-12-15 ENCOUNTER — Other Ambulatory Visit

## 2023-12-16 ENCOUNTER — Ambulatory Visit: Admitting: Internal Medicine

## 2023-12-16 ENCOUNTER — Encounter: Payer: Self-pay | Admitting: Internal Medicine

## 2023-12-16 ENCOUNTER — Ambulatory Visit: Payer: Self-pay | Admitting: Internal Medicine

## 2023-12-16 VITALS — BP 140/70 | HR 83 | Temp 97.6°F | Ht 65.0 in | Wt 162.0 lb

## 2023-12-16 DIAGNOSIS — R4189 Other symptoms and signs involving cognitive functions and awareness: Secondary | ICD-10-CM

## 2023-12-16 DIAGNOSIS — I10 Essential (primary) hypertension: Secondary | ICD-10-CM

## 2023-12-16 DIAGNOSIS — E1142 Type 2 diabetes mellitus with diabetic polyneuropathy: Secondary | ICD-10-CM

## 2023-12-16 DIAGNOSIS — N1831 Chronic kidney disease, stage 3a: Secondary | ICD-10-CM

## 2023-12-16 DIAGNOSIS — L738 Other specified follicular disorders: Secondary | ICD-10-CM

## 2023-12-16 DIAGNOSIS — K760 Fatty (change of) liver, not elsewhere classified: Secondary | ICD-10-CM

## 2023-12-16 LAB — BMP8+ANION GAP
Anion Gap: 15 mmol/L (ref 10.0–18.0)
BUN/Creatinine Ratio: 18 (ref 10–24)
BUN: 23 mg/dL (ref 8–27)
CO2: 20 mmol/L (ref 20–29)
Calcium: 9.3 mg/dL (ref 8.6–10.2)
Chloride: 106 mmol/L (ref 96–106)
Creatinine, Ser: 1.31 mg/dL — ABNORMAL HIGH (ref 0.76–1.27)
Glucose: 107 mg/dL — ABNORMAL HIGH (ref 70–99)
Potassium: 5.1 mmol/L (ref 3.5–5.2)
Sodium: 141 mmol/L (ref 134–144)
eGFR: 60 mL/min/{1.73_m2} (ref >59)

## 2023-12-16 LAB — GLUCOSE, POCT (MANUAL RESULT ENTRY): POC Glucose: 97 mg/dL (ref 70–99)

## 2023-12-16 MED ORDER — TRIPLE ANTIBIOTIC 3.5-400-5000 EX OINT: 1.0000 | TOPICAL_OINTMENT | Freq: Two times a day (BID) | CUTANEOUS | 0 refills | Status: AC | PRN

## 2023-12-16 MED ORDER — FREESTYLE LIBRE 3 PLUS SENSOR MISC: 11 refills | Status: DC

## 2023-12-16 NOTE — Progress Notes (Signed)
 Established Patient Office Visit  Subjective:  Patient ID: Daniel Powell, male    DOB: 04-03-1956  Age: 68 y.o. MRN: [id number]  Chief Complaint  Patient presents with   Follow-up    2 week lab results    No new complaints, here for lab review. Labs reviewed and notable for improvement in renal function and potassium and he endorses improvement in his ankle and foot edema. Son interpreted.    No other concerns at this time.   Past Medical History:  Diagnosis Date   Diabetes mellitus without complication (HCC)    Hypertension     Past Surgical History:  Procedure Laterality Date   BACK SURGERY  2014   COLONOSCOPY WITH PROPOFOL  N/A 03/11/2018   Procedure: COLONOSCOPY WITH PROPOFOL ;  Surgeon: Therisa Bi, MD;  Location: [facility] ENDOSCOPY;  Service: Gastroenterology;  Laterality: N/A;   HERNIA REPAIR      Social History   Socioeconomic History   Marital status: Legally Separated    Spouse name: Not on file   Number of children: 8   Years of education: Not on file   Highest education level: Not on file  Occupational History   Occupation: Unemployed  Tobacco Use   Smoking status: Never   Smokeless tobacco: Never  Vaping Use   Vaping status: Never Used  Substance and Sexual Activity   Alcohol use: No    Alcohol/week: 0.0 standard drinks of alcohol   Drug use: No   Sexual activity: Not Currently    Partners: Female  Other Topics Concern   Not on file  Social History Narrative   Patient and his brother live together-his brother works. Mr. [name] does not work.  Has significant other.        Has 5 adult sons and 3 daughters.      Brother has recently travelled to [place]      Son lives about 2hrs away.   Social Drivers of Corporate investment banker Strain: Not on file  Food Insecurity: No Food Insecurity (07/05/2022)   Hunger Vital Sign    Worried About Running Out of Food in the Last Year: Never true    Ran Out of Food in the Last Year: Never true   Transportation Needs: No Transportation Needs (07/05/2022)   PRAPARE - Administrator, Civil Service (Medical): No    Lack of Transportation (Non-Medical): No  Physical Activity: Inactive (02/10/2018)   Exercise Vital Sign    Days of Exercise per Week: 0 days    Minutes of Exercise per Session: 0 min  Stress: No Stress Concern Present (02/10/2018)   [employer] of Occupational Health - Occupational Stress Questionnaire    Feeling of Stress : Not at all  Social Connections: Moderately Isolated (02/10/2018)   Social Connection and Isolation Panel    Frequency of Communication with Friends and Family: Twice a week    Frequency of Social Gatherings with Friends and Family: Twice a week    Attends Religious Services: Never    Database administrator or Organizations: No    Attends Banker Meetings: Never    Marital Status: Separated  Intimate Partner Violence: Not At Risk (07/05/2022)   Humiliation, Afraid, Rape, and Kick questionnaire    Fear of Current or Ex-Partner: No    Emotionally Abused: No    Physically Abused: No    Sexually Abused: No    Family History  Problem Relation Age of Onset   Heart  attack Mother    Diabetes Mother    Hypertension Mother    Heart disease Father    Diabetes Father    Hypertension Father    Diabetes Sister    Hypertension Sister    Diabetes Son    Diabetes Son    Hip dysplasia Daughter     Allergies  Allergen Reactions   Gadolinium Derivatives Itching    Pt began itching across ribs and back and neck Shawonda Kerce/p Multihance  injection    Outpatient Medications Prior to Visit  Medication Sig   Accu-Chek Softclix Lancets lancets Use as instructed   atorvastatin  (LIPITOR) 40 MG tablet Take 1 tablet (40 mg total) by mouth daily.   empagliflozin  (JARDIANCE ) 25 MG TABS tablet Take 1 tablet (25 mg total) by mouth daily.   glimepiride  (AMARYL ) 4 MG tablet Take 1 tablet (4 mg total) by mouth 2 (two) times daily.   glucose blood  (ACCU-CHEK GUIDE) test strip Use as instructed   Lancets Misc. (ACCU-CHEK SOFTCLIX LANCET DEV) KIT 1 Device by Does not apply route daily in the afternoon.   meloxicam  (MOBIC ) 15 MG tablet Take 15 mg by mouth daily.   metFORMIN  (GLUCOPHAGE ) 1000 MG tablet Take 1 tablet (1,000 mg total) by mouth 2 (two) times daily.   pioglitazone  (ACTOS ) 45 MG tablet Take 1 tablet (45 mg total) by mouth every morning.   RYBELSUS  14 MG TABS Take 1 tablet by mouth once daily   tamsulosin  (FLOMAX ) 0.4 MG CAPS capsule Take 1 capsule (0.4 mg total) by mouth daily after supper.   valsartan  (DIOVAN ) 320 MG tablet Take 1 tablet (320 mg total) by mouth daily.   acetaminophen  (TYLENOL ) 325 MG tablet Take 2 tablets (650 mg total) by mouth every 6 (six) hours as needed for mild pain or headache (fever >/= 101). (Patient not taking: Reported on 12/16/2023)   albuterol  (VENTOLIN  HFA) 108 (90 Base) MCG/ACT inhaler Inhale 2 puffs into the lungs every 6 (six) hours as needed. (Patient not taking: Reported on 12/16/2023)   allopurinol  (ZYLOPRIM ) 300 MG tablet Take 1 tablet by mouth once daily (Patient not taking: Reported on 12/16/2023)   lactulose  (CEPHULAC ) 20 g packet Take 1 packet (20 g total) by mouth daily as needed (constipation). (Patient not taking: Reported on 12/16/2023)   pantoprazole  (PROTONIX ) 40 MG tablet Take 1 tablet (40 mg total) by mouth daily. (Patient not taking: Reported on 12/16/2023)   senna-docusate (SENOKOT-Eliab Closson) 8.6-50 MG tablet Take 2 tablets by mouth 2 (two) times daily. Hold this medicine if loose stools (Patient not taking: Reported on 12/16/2023)   No facility-administered medications prior to visit.    Review of Systems  Constitutional:  Positive for weight loss (6 lbs).  HENT: Negative.    Eyes: Negative.   Respiratory: Negative.  Negative for shortness of breath.   Cardiovascular: Negative.   Gastrointestinal: Negative.   Genitourinary: Negative.   Musculoskeletal:  Positive for back pain.  Skin:  Negative.   Neurological: Negative.   Endo/Heme/Allergies: Negative.        Objective:   BP (!) 140/70   Pulse 83   Temp 97.6 F (36.4 C)   Ht 5' 5 (1.651 m)   Wt 162 lb (73.5 kg)   SpO2 97%   BMI 26.96 kg/m   Vitals:   12/16/23 1324  BP: (!) 140/70  Pulse: 83  Temp: 97.6 F (36.4 C)  Height: 5' 5 (1.651 m)  Weight: 162 lb (73.5 kg)  SpO2: 97%  BMI (Calculated): 26.96  Physical Exam Vitals reviewed.  Constitutional:      Appearance: Normal appearance.  HENT:     Head: Normocephalic.     Left Ear: There is no impacted cerumen.     Nose: Nose normal.     Mouth/Throat:     Mouth: Mucous membranes are moist.     Pharynx: No posterior oropharyngeal erythema.   Eyes:     General: Visual field deficit present.     Extraocular Movements: Extraocular movements intact.     Pupils: Pupils are equal, round, and reactive to light.    Cardiovascular:     Rate and Rhythm: Regular rhythm.     Chest Wall: PMI is not displaced.     Pulses: Normal pulses.     Heart sounds: Normal heart sounds. No murmur heard. Pulmonary:     Effort: Pulmonary effort is normal.     Breath sounds: Normal air entry. No rhonchi or rales.  Abdominal:     General: Abdomen is flat. Bowel sounds are normal. There is no distension.     Palpations: Abdomen is soft. There is no hepatomegaly, splenomegaly or mass.     Tenderness: There is no abdominal tenderness.   Musculoskeletal:     Cervical back: Normal range of motion and neck supple.     Lumbar back: Positive right straight leg raise test and positive left straight leg raise test.     Right lower leg: 1+ Edema present.     Left lower leg: 1+ Edema present.   Skin:    General: Skin is warm and dry.   Neurological:     General: No focal deficit present.     Mental Status: He is alert.     Cranial Nerves: Cranial nerve deficit (reduced visual acuity) present.     Motor: No weakness.   Psychiatric:        Mood and Affect: Mood  normal.        Behavior: Behavior normal.      Results for orders placed or performed in visit on 12/16/23  POCT Glucose (CBG)  Result Value Ref Range   POC Glucose 97 70 - 99 mg/dl    Recent Results (from the past 2160 hours)  POCT CBG (Fasting - Glucose)     Status: Abnormal   Collection Time: 11/18/23  2:50 PM  Result Value Ref Range   Glucose Fasting, POC 172 (A) 70 - 99 mg/dL  POCT Urinalysis Dipstick (18997)     Status: Abnormal   Collection Time: 11/18/23  3:27 PM  Result Value Ref Range   Color, UA     Clarity, UA     Glucose, UA Positive (A) Negative   Bilirubin, UA Negative    Ketones, UA Negative    Spec Grav, UA 1.015 1.010 - 1.025   Blood, UA Positive    pH, UA 6.0 5.0 - 8.0   Protein, UA Negative Negative   Urobilinogen, UA 0.2 0.2 or 1.0 E.U./dL   Nitrite, UA Negative    Leukocytes, UA Negative Negative   Appearance     Odor    Hemoglobin A1c     Status: Abnormal   Collection Time: 11/28/23  9:44 AM  Result Value Ref Range   Hgb A1c MFr Bld 7.4 (H) 4.8 - 5.6 %    Comment:          Prediabetes: 5.7 - 6.4          Diabetes: >6.4  Glycemic control for adults with diabetes: <7.0    Est. average glucose Bld gHb Est-mCnc 166 mg/dL  Lipid panel     Status: Abnormal   Collection Time: 11/28/23  9:44 AM  Result Value Ref Range   Cholesterol, Total 168 100 - 199 mg/dL   Triglycerides 638 (H) 0 - 149 mg/dL   HDL 28 (L) >60 mg/dL   VLDL Cholesterol Cal 59 (H) 5 - 40 mg/dL   LDL Chol Calc (NIH) 81 0 - 99 mg/dL   Chol/HDL Ratio 6.0 (H) 0.0 - 5.0 ratio    Comment:                                   T. Chol/HDL Ratio                                             Men  Women                               1/2 Avg.Risk  3.4    3.3                                   Avg.Risk  5.0    4.4                                2X Avg.Risk  9.6    7.1                                3X Avg.Risk 23.4   11.0   Comprehensive metabolic panel     Status: Abnormal   Collection  Time: 11/28/23  9:44 AM  Result Value Ref Range   Glucose 217 (H) 70 - 99 mg/dL   BUN 32 (H) 8 - 27 mg/dL   Creatinine, Ser 8.31 (H) 0.76 - 1.27 mg/dL   eGFR 45 (L) >40 fO/fpw/8.26   BUN/Creatinine Ratio 19 10 - 24   Sodium 139 134 - 144 mmol/L   Potassium 5.3 (H) 3.5 - 5.2 mmol/L   Chloride 102 96 - 106 mmol/L   CO2 22 20 - 29 mmol/L   Calcium  8.8 8.6 - 10.2 mg/dL   Total Protein 6.9 6.0 - 8.5 g/dL   Albumin 4.4 3.9 - 4.9 g/dL   Globulin, Total 2.5 1.5 - 4.5 g/dL   Bilirubin Total 0.8 0.0 - 1.2 mg/dL   Alkaline Phosphatase 54 44 - 121 IU/L   AST 17 0 - 40 IU/L   ALT 14 0 - 44 IU/L  POCT CBG (Fasting - Glucose)     Status: Abnormal   Collection Time: 12/02/23  1:32 PM  Result Value Ref Range   Glucose Fasting, POC 338 (A) 70 - 99 mg/dL  AFE1+Jwpnw Gap     Status: Abnormal   Collection Time: 12/15/23  9:51 AM  Result Value Ref Range   Glucose 107 (H) 70 - 99 mg/dL   BUN 23 8 - 27 mg/dL   Creatinine, Ser 8.68 (H) 0.76 - 1.27 mg/dL   eGFR 60 >40 fO/fpw/8.26  BUN/Creatinine Ratio 18 10 - 24   Sodium 141 134 - 144 mmol/L   Potassium 5.1 3.5 - 5.2 mmol/L   Chloride 106 96 - 106 mmol/L   CO2 20 20 - 29 mmol/L   Anion Gap 15.0 10.0 - 18.0 mmol/L   Calcium  9.3 8.6 - 10.2 mg/dL  POCT Glucose (CBG)     Status: Normal   Collection Time: 12/16/23  1:35 PM  Result Value Ref Range   POC Glucose 97 70 - 99 mg/dl      Assessment & Plan:  As per problem list  Problem List Items Addressed This Visit       Endocrine   Type 2 diabetes mellitus with peripheral neuropathy (HCC) - Primary   Relevant Orders   POCT Glucose (CBG) (Completed)    Return in about 10 weeks (around 02/24/2024) for fu with labs prior.   Total time spent: 20 minutes  Sherrill Cinderella Perry, MD  12/16/2023   This document may have been prepared by [facility] Voice Recognition software and as such may include unintentional dictation errors.

## 2023-12-19 NOTE — Telephone Encounter (Signed)
 Patient is unable to get [facility] 3  plus since he is not on a daily injection

## 2024-01-05 ENCOUNTER — Other Ambulatory Visit: Payer: Self-pay | Admitting: Internal Medicine

## 2024-01-05 ENCOUNTER — Ambulatory Visit: Admitting: Internal Medicine

## 2024-01-05 ENCOUNTER — Encounter: Payer: Self-pay | Admitting: Internal Medicine

## 2024-01-05 VITALS — BP 140/70 | HR 96 | Temp 98.4°F | Ht 65.0 in | Wt 157.2 lb

## 2024-01-05 DIAGNOSIS — I10 Essential (primary) hypertension: Secondary | ICD-10-CM

## 2024-01-05 DIAGNOSIS — E1129 Type 2 diabetes mellitus with other diabetic kidney complication: Secondary | ICD-10-CM

## 2024-01-05 DIAGNOSIS — R4189 Other symptoms and signs involving cognitive functions and awareness: Secondary | ICD-10-CM | POA: Diagnosis not present

## 2024-01-05 DIAGNOSIS — E1142 Type 2 diabetes mellitus with diabetic polyneuropathy: Secondary | ICD-10-CM

## 2024-01-05 LAB — GLUCOSE, POCT (MANUAL RESULT ENTRY): POC Glucose: 185 mg/dL — AB (ref 70–99)

## 2024-01-05 MED ORDER — VALSARTAN-HYDROCHLOROTHIAZIDE 320-12.5 MG PO TABS
1.0000 | ORAL_TABLET | Freq: Every day | ORAL | 0 refills | Status: DC
Start: 2024-01-05 — End: 2024-03-26

## 2024-01-05 NOTE — Progress Notes (Signed)
 Established Patient Office Visit  Subjective:  Patient ID: Daniel Powell, male    DOB: March 23, 1956  Age: 68 y.o. MRN: [id number]  Chief Complaint  Patient presents with   Follow-up    Discuss paperwork    No new complaints, here for med refill and review of immigration paperwork. Denies any hypoglycemic episodes and home bg readings have been at target. Endorses improvement in leg swelling but bp still elevated even at home.    No other concerns at this time.   Past Medical History:  Diagnosis Date   Diabetes mellitus without complication (HCC)    Hypertension     Past Surgical History:  Procedure Laterality Date   BACK SURGERY  2014   COLONOSCOPY WITH PROPOFOL  N/A 03/11/2018   Procedure: COLONOSCOPY WITH PROPOFOL ;  Surgeon: Therisa Bi, MD;  Location: [facility] ENDOSCOPY;  Service: Gastroenterology;  Laterality: N/A;   HERNIA REPAIR      Social History   Socioeconomic History   Marital status: Legally Separated    Spouse name: Not on file   Number of children: 8   Years of education: Not on file   Highest education level: Not on file  Occupational History   Occupation: Unemployed  Tobacco Use   Smoking status: Never   Smokeless tobacco: Never  Vaping Use   Vaping status: Never Used  Substance and Sexual Activity   Alcohol use: No    Alcohol/week: 0.0 standard drinks of alcohol   Drug use: No   Sexual activity: Not Currently    Partners: Female  Other Topics Concern   Not on file  Social History Narrative   Patient and his brother live together-his brother works. Mr. Jermaine does not work.  Has significant other.        Has 5 adult sons and 3 daughters.      Brother has recently travelled to [place]      Son lives about 2hrs away.   Social Drivers of Corporate investment banker Strain: Not on file  Food Insecurity: No Food Insecurity (07/05/2022)   Hunger Vital Sign    Worried About Running Out of Food in the Last Year: Never true    Ran Out of Food in the  Last Year: Never true  Transportation Needs: No Transportation Needs (07/05/2022)   PRAPARE - Administrator, Civil Service (Medical): No    Lack of Transportation (Non-Medical): No  Physical Activity: Inactive (02/10/2018)   Exercise Vital Sign    Days of Exercise per Week: 0 days    Minutes of Exercise per Session: 0 min  Stress: No Stress Concern Present (02/10/2018)   [employer] of Occupational Health - Occupational Stress Questionnaire    Feeling of Stress : Not at all  Social Connections: Moderately Isolated (02/10/2018)   Social Connection and Isolation Panel    Frequency of Communication with Friends and Family: Twice a week    Frequency of Social Gatherings with Friends and Family: Twice a week    Attends Religious Services: Never    Database administrator or Organizations: No    Attends Banker Meetings: Never    Marital Status: Separated  Intimate Partner Violence: Not At Risk (07/05/2022)   Humiliation, Afraid, Rape, and Kick questionnaire    Fear of Current or Ex-Partner: No    Emotionally Abused: No    Physically Abused: No    Sexually Abused: No    Family History  Problem Relation Age of  Onset   Heart attack Mother    Diabetes Mother    Hypertension Mother    Heart disease Father    Diabetes Father    Hypertension Father    Diabetes Sister    Hypertension Sister    Diabetes Son    Diabetes Son    Hip dysplasia Daughter     Allergies  Allergen Reactions   Gadolinium Derivatives Itching    Pt began itching across ribs and back and neck Jasean Ambrosia/p Multihance  injection    Outpatient Medications Prior to Visit  Medication Sig   Accu-Chek Softclix Lancets lancets Use as instructed   acetaminophen  (TYLENOL ) 325 MG tablet Take 2 tablets (650 mg total) by mouth every 6 (six) hours as needed for mild pain or headache (fever >/= 101).   albuterol  (VENTOLIN  HFA) 108 (90 Base) MCG/ACT inhaler Inhale 2 puffs into the lungs every 6 (six) hours  as needed.   allopurinol  (ZYLOPRIM ) 300 MG tablet Take 1 tablet by mouth once daily   atorvastatin  (LIPITOR) 40 MG tablet Take 1 tablet (40 mg total) by mouth daily.   Continuous Glucose Sensor (FREESTYLE LIBRE 3 PLUS SENSOR) MISC Change sensor every 15 days.   glimepiride  (AMARYL ) 4 MG tablet Take 1 tablet by mouth twice daily   glucose blood (ACCU-CHEK GUIDE) test strip Use as instructed   JARDIANCE  25 MG TABS tablet Take 1 tablet by mouth once daily   lactulose  (CEPHULAC ) 20 g packet Take 1 packet (20 g total) by mouth daily as needed (constipation).   Lancets Misc. (ACCU-CHEK SOFTCLIX LANCET DEV) KIT 1 Device by Does not apply route daily in the afternoon.   meloxicam  (MOBIC ) 15 MG tablet Take 15 mg by mouth daily.   metFORMIN  (GLUCOPHAGE ) 1000 MG tablet Take 1 tablet by mouth twice daily   pantoprazole  (PROTONIX ) 40 MG tablet Take 1 tablet (40 mg total) by mouth daily.   pioglitazone  (ACTOS ) 45 MG tablet TAKE 1 TABLET BY MOUTH IN THE MORNING   RYBELSUS  14 MG TABS Take 1 tablet by mouth once daily   senna-docusate (SENOKOT-Belicia Difatta) 8.6-50 MG tablet Take 2 tablets by mouth 2 (two) times daily. Hold this medicine if loose stools   tamsulosin  (FLOMAX ) 0.4 MG CAPS capsule Take 1 capsule (0.4 mg total) by mouth daily after supper.   [DISCONTINUED] valsartan  (DIOVAN ) 320 MG tablet Take 1 tablet by mouth once daily   No facility-administered medications prior to visit.    Review of Systems  Constitutional:  Positive for weight loss (6 lbs).  HENT: Negative.    Eyes: Negative.   Respiratory: Negative.  Negative for shortness of breath.   Cardiovascular: Negative.   Gastrointestinal: Negative.   Genitourinary: Negative.   Musculoskeletal:  Positive for back pain.  Skin: Negative.   Neurological: Negative.   Endo/Heme/Allergies: Negative.        Objective:   BP (!) 140/70   Pulse 96   Temp 98.4 F (36.9 C)   Ht 5' 5 (1.651 m)   Wt 157 lb 3.2 oz (71.3 kg)   SpO2 97%   BMI 26.16  kg/m   Vitals:   01/05/24 1514  BP: (!) 140/70  Pulse: 96  Temp: 98.4 F (36.9 C)  Height: 5' 5 (1.651 m)  Weight: 157 lb 3.2 oz (71.3 kg)  SpO2: 97%  BMI (Calculated): 26.16    Physical Exam Vitals reviewed.  Constitutional:      Appearance: Normal appearance.  HENT:     Head: Normocephalic.  Left Ear: There is no impacted cerumen.     Nose: Nose normal.     Mouth/Throat:     Mouth: Mucous membranes are moist.     Pharynx: No posterior oropharyngeal erythema.  Eyes:     General: Visual field deficit present.     Extraocular Movements: Extraocular movements intact.     Pupils: Pupils are equal, round, and reactive to light.  Cardiovascular:     Rate and Rhythm: Regular rhythm.     Chest Wall: PMI is not displaced.     Pulses: Normal pulses.     Heart sounds: Normal heart sounds. No murmur heard. Pulmonary:     Effort: Pulmonary effort is normal.     Breath sounds: Normal air entry. No rhonchi or rales.  Abdominal:     General: Abdomen is flat. Bowel sounds are normal. There is no distension.     Palpations: Abdomen is soft. There is no hepatomegaly, splenomegaly or mass.     Tenderness: There is no abdominal tenderness.  Musculoskeletal:     Cervical back: Normal range of motion and neck supple.     Lumbar back: Positive right straight leg raise test and positive left straight leg raise test.     Right lower leg: 1+ Edema present.     Left lower leg: 1+ Edema present.  Skin:    General: Skin is warm and dry.  Neurological:     General: No focal deficit present.     Mental Status: He is alert.     Cranial Nerves: Cranial nerve deficit (reduced visual acuity) present.     Motor: No weakness.  Psychiatric:        Mood and Affect: Mood normal.        Behavior: Behavior normal.      Results for orders placed or performed in visit on 01/05/24  POCT Glucose (CBG)  Result Value Ref Range   POC Glucose 185 (A) 70 - 99 mg/dl    Recent Results (from the  past 2160 hours)  POCT CBG (Fasting - Glucose)     Status: Abnormal   Collection Time: 11/18/23  2:50 PM  Result Value Ref Range   Glucose Fasting, POC 172 (A) 70 - 99 mg/dL  POCT Urinalysis Dipstick (18997)     Status: Abnormal   Collection Time: 11/18/23  3:27 PM  Result Value Ref Range   Color, UA     Clarity, UA     Glucose, UA Positive (A) Negative   Bilirubin, UA Negative    Ketones, UA Negative    Spec Grav, UA 1.015 1.010 - 1.025   Blood, UA Positive    pH, UA 6.0 5.0 - 8.0   Protein, UA Negative Negative   Urobilinogen, UA 0.2 0.2 or 1.0 E.U./dL   Nitrite, UA Negative    Leukocytes, UA Negative Negative   Appearance     Odor    Hemoglobin A1c     Status: Abnormal   Collection Time: 11/28/23  9:44 AM  Result Value Ref Range   Hgb A1c MFr Bld 7.4 (H) 4.8 - 5.6 %    Comment:          Prediabetes: 5.7 - 6.4          Diabetes: >6.4          Glycemic control for adults with diabetes: <7.0    Est. average glucose Bld gHb Est-mCnc 166 mg/dL  Lipid panel     Status: Abnormal  Collection Time: 11/28/23  9:44 AM  Result Value Ref Range   Cholesterol, Total 168 100 - 199 mg/dL   Triglycerides 638 (H) 0 - 149 mg/dL   HDL 28 (L) >60 mg/dL   VLDL Cholesterol Cal 59 (H) 5 - 40 mg/dL   LDL Chol Calc (NIH) 81 0 - 99 mg/dL   Chol/HDL Ratio 6.0 (H) 0.0 - 5.0 ratio    Comment:                                   T. Chol/HDL Ratio                                             Men  Women                               1/2 Avg.Risk  3.4    3.3                                   Avg.Risk  5.0    4.4                                2X Avg.Risk  9.6    7.1                                3X Avg.Risk 23.4   11.0   Comprehensive metabolic panel     Status: Abnormal   Collection Time: 11/28/23  9:44 AM  Result Value Ref Range   Glucose 217 (H) 70 - 99 mg/dL   BUN 32 (H) 8 - 27 mg/dL   Creatinine, Ser 8.31 (H) 0.76 - 1.27 mg/dL   eGFR 45 (L) >40 fO/fpw/8.26   BUN/Creatinine Ratio 19 10 - 24    Sodium 139 134 - 144 mmol/L   Potassium 5.3 (H) 3.5 - 5.2 mmol/L   Chloride 102 96 - 106 mmol/L   CO2 22 20 - 29 mmol/L   Calcium  8.8 8.6 - 10.2 mg/dL   Total Protein 6.9 6.0 - 8.5 g/dL   Albumin 4.4 3.9 - 4.9 g/dL   Globulin, Total 2.5 1.5 - 4.5 g/dL   Bilirubin Total 0.8 0.0 - 1.2 mg/dL   Alkaline Phosphatase 54 44 - 121 IU/L   AST 17 0 - 40 IU/L   ALT 14 0 - 44 IU/L  POCT CBG (Fasting - Glucose)     Status: Abnormal   Collection Time: 12/02/23  1:32 PM  Result Value Ref Range   Glucose Fasting, POC 338 (A) 70 - 99 mg/dL  AFE1+Jwpnw Gap     Status: Abnormal   Collection Time: 12/15/23  9:51 AM  Result Value Ref Range   Glucose 107 (H) 70 - 99 mg/dL   BUN 23 8 - 27 mg/dL   Creatinine, Ser 8.68 (H) 0.76 - 1.27 mg/dL   eGFR 60 >40 fO/fpw/8.26   BUN/Creatinine Ratio 18 10 - 24   Sodium 141 134 - 144 mmol/L   Potassium 5.1 3.5 - 5.2 mmol/L   Chloride 106 96 -  106 mmol/L   CO2 20 20 - 29 mmol/L   Anion Gap 15.0 10.0 - 18.0 mmol/L   Calcium  9.3 8.6 - 10.2 mg/dL  POCT Glucose (CBG)     Status: Normal   Collection Time: 12/16/23  1:35 PM  Result Value Ref Range   POC Glucose 97 70 - 99 mg/dl  POCT Glucose (CBG)     Status: Abnormal   Collection Time: 01/05/24  3:32 PM  Result Value Ref Range   POC Glucose 185 (A) 70 - 99 mg/dl      Assessment & Plan:  As per problem list. Problem List Items Addressed This Visit       Cardiovascular and Mediastinum   HTN (hypertension), benign   Relevant Medications   valsartan -hydrochlorothiazide  (DIOVAN -HCT) 320-12.5 MG tablet     Endocrine   Type 2 diabetes mellitus with peripheral neuropathy (HCC) - Primary   Relevant Medications   valsartan -hydrochlorothiazide  (DIOVAN -HCT) 320-12.5 MG tablet   Other Relevant Orders   POCT Glucose (CBG) (Completed)   Other Visit Diagnoses       Cognitive impairment           Return Keep appt.   Total time spent: 20 minutes  Sherrill Cinderella Perry, MD  01/05/2024   This document  may have been prepared by [facility] Voice Recognition software and as such may include unintentional dictation errors.

## 2024-02-02 ENCOUNTER — Other Ambulatory Visit: Payer: Self-pay | Admitting: Internal Medicine

## 2024-02-02 DIAGNOSIS — I10 Essential (primary) hypertension: Secondary | ICD-10-CM

## 2024-02-04 ENCOUNTER — Telehealth: Payer: Self-pay | Admitting: Internal Medicine

## 2024-02-04 NOTE — Telephone Encounter (Signed)
 Pt wants 90 day refills on his VALSARTAN  sent to [facility] [address]  thanks

## 2024-02-05 ENCOUNTER — Other Ambulatory Visit: Payer: Self-pay | Admitting: Internal Medicine

## 2024-02-05 ENCOUNTER — Telehealth: Payer: Self-pay | Admitting: Internal Medicine

## 2024-02-05 DIAGNOSIS — I10 Essential (primary) hypertension: Secondary | ICD-10-CM

## 2024-02-05 NOTE — Telephone Encounter (Signed)
 Pt still needing refills for 90 days on his VALSARTAN  sent to [facility] on [address]

## 2024-02-06 ENCOUNTER — Other Ambulatory Visit: Payer: Self-pay

## 2024-02-06 DIAGNOSIS — I10 Essential (primary) hypertension: Secondary | ICD-10-CM

## 2024-02-06 NOTE — Telephone Encounter (Signed)
Rx request was sent to PCP.

## 2024-02-07 ENCOUNTER — Other Ambulatory Visit: Payer: Self-pay | Admitting: Internal Medicine

## 2024-02-07 DIAGNOSIS — I10 Essential (primary) hypertension: Secondary | ICD-10-CM

## 2024-02-24 ENCOUNTER — Other Ambulatory Visit: Payer: Self-pay | Admitting: Internal Medicine

## 2024-02-24 ENCOUNTER — Ambulatory Visit: Admitting: Internal Medicine

## 2024-02-24 DIAGNOSIS — I10 Essential (primary) hypertension: Secondary | ICD-10-CM

## 2024-03-15 ENCOUNTER — Ambulatory Visit: Admitting: Internal Medicine

## 2024-03-15 VITALS — BP 120/60 | HR 85 | Temp 97.8°F | Ht 65.0 in | Wt 160.8 lb

## 2024-03-15 DIAGNOSIS — N1831 Chronic kidney disease, stage 3a: Secondary | ICD-10-CM

## 2024-03-15 DIAGNOSIS — E1142 Type 2 diabetes mellitus with diabetic polyneuropathy: Secondary | ICD-10-CM | POA: Diagnosis not present

## 2024-03-15 DIAGNOSIS — K760 Fatty (change of) liver, not elsewhere classified: Secondary | ICD-10-CM

## 2024-03-15 LAB — POCT CBG (FASTING - GLUCOSE)-MANUAL ENTRY: Glucose Fasting, POC: 183 mg/dL — AB (ref 70–99)

## 2024-03-15 NOTE — Progress Notes (Signed)
 Established Patient Office Visit  Subjective:  Patient ID: Daniel Powell, male    DOB: Nov 18, 1955  Age: 68 y.o. MRN: [id number]  Chief Complaint  Patient presents with   Follow-up    10 week follow up    No new complaints, here for lab review and medication refills. Failed to have previsit labs done.Occasional hypoglycemic episodes at home.    No other concerns at this time.   Past Medical History:  Diagnosis Date   Diabetes mellitus without complication (HCC)    Hypertension     Past Surgical History:  Procedure Laterality Date   BACK SURGERY  2014   COLONOSCOPY WITH PROPOFOL  N/A 03/11/2018   Procedure: COLONOSCOPY WITH PROPOFOL ;  Surgeon: Therisa Bi, MD;  Location: [facility] ENDOSCOPY;  Service: Gastroenterology;  Laterality: N/A;   HERNIA REPAIR      Social History   Socioeconomic History   Marital status: Legally Separated    Spouse name: Not on file   Number of children: 8   Years of education: Not on file   Highest education level: Not on file  Occupational History   Occupation: Unemployed  Tobacco Use   Smoking status: Never   Smokeless tobacco: Never  Vaping Use   Vaping status: Never Used  Substance and Sexual Activity   Alcohol use: No    Alcohol/week: 0.0 standard drinks of alcohol   Drug use: No   Sexual activity: Not Currently    Partners: Female  Other Topics Concern   Not on file  Social History Narrative   Patient and his brother live together-his brother works. Mr. Shermon does not work.  Has significant other.        Has 5 adult sons and 3 daughters.      Brother has recently travelled to [place]      Son lives about 2hrs away.   Social Drivers of Corporate investment banker Strain: Not on file  Food Insecurity: No Food Insecurity (07/05/2022)   Hunger Vital Sign    Worried About Running Out of Food in the Last Year: Never true    Ran Out of Food in the Last Year: Never true  Transportation Needs: No Transportation Needs (07/05/2022)    PRAPARE - Administrator, Civil Service (Medical): No    Lack of Transportation (Non-Medical): No  Physical Activity: Inactive (02/10/2018)   Exercise Vital Sign    Days of Exercise per Week: 0 days    Minutes of Exercise per Session: 0 min  Stress: No Stress Concern Present (02/10/2018)   [employer] of Occupational Health - Occupational Stress Questionnaire    Feeling of Stress : Not at all  Social Connections: Moderately Isolated (02/10/2018)   Social Connection and Isolation Panel    Frequency of Communication with Friends and Family: Twice a week    Frequency of Social Gatherings with Friends and Family: Twice a week    Attends Religious Services: Never    Database administrator or Organizations: No    Attends Banker Meetings: Never    Marital Status: Separated  Intimate Partner Violence: Not At Risk (07/05/2022)   Humiliation, Afraid, Rape, and Kick questionnaire    Fear of Current or Ex-Partner: No    Emotionally Abused: No    Physically Abused: No    Sexually Abused: No    Family History  Problem Relation Age of Onset   Heart attack Mother    Diabetes Mother  Hypertension Mother    Heart disease Father    Diabetes Father    Hypertension Father    Diabetes Sister    Hypertension Sister    Diabetes Son    Diabetes Son    Hip dysplasia Daughter     Allergies  Allergen Reactions   Gadolinium Derivatives Itching    Pt began itching across ribs and back and neck Macel Yearsley/p Multihance  injection    Outpatient Medications Prior to Visit  Medication Sig   acetaminophen  (TYLENOL ) 325 MG tablet Take 2 tablets (650 mg total) by mouth every 6 (six) hours as needed for mild pain or headache (fever >/= 101).   albuterol  (VENTOLIN  HFA) 108 (90 Base) MCG/ACT inhaler Inhale 2 puffs into the lungs every 6 (six) hours as needed.   allopurinol  (ZYLOPRIM ) 300 MG tablet Take 1 tablet by mouth once daily   atorvastatin  (LIPITOR) 40 MG tablet Take 1 tablet (40  mg total) by mouth daily.   Continuous Glucose Sensor (FREESTYLE LIBRE 3 PLUS SENSOR) MISC Change sensor every 15 days.   glimepiride  (AMARYL ) 4 MG tablet Take 1 tablet by mouth twice daily   glucose blood (ACCU-CHEK GUIDE) test strip Use as instructed   JARDIANCE  25 MG TABS tablet Take 1 tablet by mouth once daily   lactulose  (CEPHULAC ) 20 g packet Take 1 packet (20 g total) by mouth daily as needed (constipation).   meloxicam  (MOBIC ) 15 MG tablet Take 15 mg by mouth daily.   metFORMIN  (GLUCOPHAGE ) 1000 MG tablet Take 1 tablet by mouth twice daily   pantoprazole  (PROTONIX ) 40 MG tablet Take 1 tablet (40 mg total) by mouth daily.   pioglitazone  (ACTOS ) 45 MG tablet TAKE 1 TABLET BY MOUTH IN THE MORNING   RYBELSUS  14 MG TABS Take 1 tablet by mouth once daily   tamsulosin  (FLOMAX ) 0.4 MG CAPS capsule Take 1 capsule (0.4 mg total) by mouth daily after supper.   valsartan -hydrochlorothiazide  (DIOVAN -HCT) 320-12.5 MG tablet Take 1 tablet by mouth daily.   [DISCONTINUED] Accu-Chek Softclix Lancets lancets Use as instructed   [DISCONTINUED] Lancets Misc. (ACCU-CHEK SOFTCLIX LANCET DEV) KIT 1 Device by Does not apply route daily in the afternoon.   [DISCONTINUED] senna-docusate (SENOKOT-[place] Colocho) 8.6-50 MG tablet Take 2 tablets by mouth 2 (two) times daily. Hold this medicine if loose stools   No facility-administered medications prior to visit.    Review of Systems  Constitutional:  Negative for weight loss (gained 3 lbs).  HENT: Negative.    Eyes: Negative.   Respiratory: Negative.  Negative for shortness of breath.   Cardiovascular: Negative.   Gastrointestinal: Negative.   Genitourinary: Negative.   Musculoskeletal:  Positive for back pain.  Skin: Negative.   Neurological: Negative.   Endo/Heme/Allergies: Negative.        Objective:   BP 120/60   Pulse 85   Temp 97.8 F (36.6 C)   Ht 5' 5 (1.651 m)   Wt 160 lb 12.8 oz (72.9 kg)   SpO2 98%   BMI 26.76 kg/m   Vitals:   03/15/24  1527  BP: 120/60  Pulse: 85  Temp: 97.8 F (36.6 C)  Height: 5' 5 (1.651 m)  Weight: 160 lb 12.8 oz (72.9 kg)  SpO2: 98%  BMI (Calculated): 26.76    Physical Exam Vitals reviewed.  Constitutional:      Appearance: Normal appearance.  HENT:     Head: Normocephalic.     Left Ear: There is no impacted cerumen.     Nose: Nose  normal.     Mouth/Throat:     Mouth: Mucous membranes are moist.     Pharynx: No posterior oropharyngeal erythema.  Eyes:     General: Visual field deficit present.     Extraocular Movements: Extraocular movements intact.     Pupils: Pupils are equal, round, and reactive to light.  Cardiovascular:     Rate and Rhythm: Regular rhythm.     Chest Wall: PMI is not displaced.     Pulses: Normal pulses.     Heart sounds: Normal heart sounds. No murmur heard. Pulmonary:     Effort: Pulmonary effort is normal.     Breath sounds: Normal air entry. No rhonchi or rales.  Abdominal:     General: Abdomen is flat. Bowel sounds are normal. There is no distension.     Palpations: Abdomen is soft. There is no hepatomegaly, splenomegaly or mass.     Tenderness: There is no abdominal tenderness.  Musculoskeletal:     Cervical back: Normal range of motion and neck supple.     Lumbar back: Positive right straight leg raise test and positive left straight leg raise test.     Right lower leg: 1+ Edema present.     Left lower leg: 1+ Edema present.  Skin:    General: Skin is warm and dry.  Neurological:     General: No focal deficit present.     Mental Status: He is alert.     Cranial Nerves: Cranial nerve deficit (reduced visual acuity) present.     Motor: No weakness.  Psychiatric:        Mood and Affect: Mood normal.        Behavior: Behavior normal.      Results for orders placed or performed in visit on 03/15/24  POCT CBG (Fasting - Glucose)  Result Value Ref Range   Glucose Fasting, POC 183 (A) 70 - 99 mg/dL    Recent Results (from the past 2160 hours)   POCT Glucose (CBG)     Status: Abnormal   Collection Time: 01/05/24  3:32 PM  Result Value Ref Range   POC Glucose 185 (A) 70 - 99 mg/dl  POCT CBG (Fasting - Glucose)     Status: Abnormal   Collection Time: 03/15/24  3:34 PM  Result Value Ref Range   Glucose Fasting, POC 183 (A) 70 - 99 mg/dL      Assessment & Plan:  Daniel Powell was seen today for follow-up.  Type 2 diabetes mellitus with peripheral neuropathy (HCC) -     POCT CBG (Fasting - Glucose) -     Hemoglobin A1c  Stage 3a chronic kidney disease (HCC) -     Comprehensive metabolic panel with GFR  Fatty liver -     Lipid panel    Problem List Items Addressed This Visit       Endocrine   Type 2 diabetes mellitus with peripheral neuropathy (HCC) - Primary   Relevant Orders   POCT CBG (Fasting - Glucose) (Completed)   Other Visit Diagnoses       Stage 3a chronic kidney disease (HCC)         Fatty liver           Return in about 1 week (around 03/22/2024) for fu with labs prior.   Total time spent: 20 minutes  Sherrill Cinderella Perry, MD  03/15/2024   This document may have been prepared by [facility] Voice Recognition software and as such may include unintentional dictation  errors.

## 2024-03-22 ENCOUNTER — Other Ambulatory Visit

## 2024-03-22 LAB — HEMOGLOBIN A1C
Est. average glucose Bld gHb Est-mCnc: 137 mg/dL
Hgb A1c MFr Bld: 6.4 % — ABNORMAL HIGH (ref 4.8–5.6)

## 2024-03-23 LAB — LIPID PANEL
Chol/HDL Ratio: 2.2 ratio (ref 0.0–5.0)
Cholesterol, Total: 135 mg/dL (ref 100–199)
HDL: 61 mg/dL (ref >39)
LDL Chol Calc (NIH): 64 mg/dL (ref 0–99)
Triglycerides: 40 mg/dL (ref 0–149)
VLDL Cholesterol Cal: 10 mg/dL (ref 5–40)

## 2024-03-23 LAB — COMPREHENSIVE METABOLIC PANEL WITH GFR
ALT: 9 IU/L (ref 0–44)
AST: 18 IU/L (ref 0–40)
Albumin: 4.5 g/dL (ref 3.9–4.9)
Alkaline Phosphatase: 77 IU/L (ref 47–123)
BUN/Creatinine Ratio: 6 — ABNORMAL LOW (ref 10–24)
BUN: 8 mg/dL (ref 8–27)
Bilirubin Total: 0.5 mg/dL (ref 0.0–1.2)
CO2: 24 mmol/L (ref 20–29)
Calcium: 9.7 mg/dL (ref 8.6–10.2)
Chloride: 102 mmol/L (ref 96–106)
Creatinine, Ser: 1.24 mg/dL (ref 0.76–1.27)
Globulin, Total: 2.4 g/dL (ref 1.5–4.5)
Glucose: 80 mg/dL (ref 70–99)
Potassium: 4 mmol/L (ref 3.5–5.2)
Sodium: 139 mmol/L (ref 134–144)
Total Protein: 6.9 g/dL (ref 6.0–8.5)
eGFR: 64 mL/min/1.73 (ref >59)

## 2024-03-26 ENCOUNTER — Ambulatory Visit: Payer: Self-pay | Admitting: Internal Medicine

## 2024-03-26 ENCOUNTER — Ambulatory Visit: Admitting: Internal Medicine

## 2024-03-26 VITALS — BP 122/64 | HR 81 | Temp 98.1°F | Ht 65.0 in | Wt 163.2 lb

## 2024-03-26 DIAGNOSIS — M51369 Other intervertebral disc degeneration, lumbar region without mention of lumbar back pain or lower extremity pain: Secondary | ICD-10-CM

## 2024-03-26 DIAGNOSIS — I10 Essential (primary) hypertension: Secondary | ICD-10-CM | POA: Diagnosis not present

## 2024-03-26 DIAGNOSIS — E1142 Type 2 diabetes mellitus with diabetic polyneuropathy: Secondary | ICD-10-CM | POA: Diagnosis not present

## 2024-03-26 DIAGNOSIS — E1129 Type 2 diabetes mellitus with other diabetic kidney complication: Secondary | ICD-10-CM | POA: Diagnosis not present

## 2024-03-26 DIAGNOSIS — R809 Proteinuria, unspecified: Secondary | ICD-10-CM

## 2024-03-26 LAB — POCT CBG (FASTING - GLUCOSE)-MANUAL ENTRY: Glucose Fasting, POC: 173 mg/dL — AB (ref 70–99)

## 2024-03-26 MED ORDER — GLIMEPIRIDE 4 MG PO TABS: 4.0000 mg | ORAL_TABLET | Freq: Two times a day (BID) | ORAL | 0 refills | Status: DC

## 2024-03-26 MED ORDER — CYCLOBENZAPRINE HCL 5 MG PO TABS: 5.0000 mg | ORAL_TABLET | Freq: Three times a day (TID) | ORAL | 1 refills | Status: AC | PRN

## 2024-03-26 MED ORDER — VALSARTAN-HYDROCHLOROTHIAZIDE 320-12.5 MG PO TABS: 1.0000 | ORAL_TABLET | Freq: Every day | ORAL | 0 refills | Status: DC

## 2024-03-26 MED ORDER — EMPAGLIFLOZIN 25 MG PO TABS: 25.0000 mg | ORAL_TABLET | Freq: Every day | ORAL | 0 refills | Status: DC

## 2024-03-26 MED ORDER — METFORMIN HCL 1000 MG PO TABS: 1000.0000 mg | ORAL_TABLET | Freq: Two times a day (BID) | ORAL | 0 refills | Status: DC

## 2024-03-26 MED ORDER — PIOGLITAZONE HCL 45 MG PO TABS: 45.0000 mg | ORAL_TABLET | Freq: Every morning | ORAL | 0 refills | Status: DC

## 2024-03-26 MED ORDER — ALLOPURINOL 300 MG PO TABS: 300.0000 mg | ORAL_TABLET | Freq: Every day | ORAL | 0 refills | Status: DC

## 2024-03-26 MED ORDER — METHYLPREDNISOLONE 4 MG PO TBPK: ORAL_TABLET | ORAL | 0 refills | Status: DC

## 2024-03-26 NOTE — Progress Notes (Signed)
 Established Patient Office Visit  Subjective:  Patient ID: Daniel Powell, male    DOB: 1956-01-14  Age: 68 y.o. MRN: [id number]  Chief Complaint  Patient presents with  . Follow-up    1 week lab results    No new complaints, here for lab review and medication refills. Labs reviewed and notable for well controlled diabetes, A1c now at target, lipids at target with unremarkable cmp. Denies any hypoglycemic episodes and home bg readings have been at target. Reports worsening mid and lower back pain x 5 days and denies trauma or undue exertion, severe 8/10 unrelieved by analgesia.     No other concerns at this time.   Past Medical History:  Diagnosis Date  . Diabetes mellitus without complication (HCC)   . Hypertension     Past Surgical History:  Procedure Laterality Date  . BACK SURGERY  2014  . COLONOSCOPY WITH PROPOFOL  N/A 03/11/2018   Procedure: COLONOSCOPY WITH PROPOFOL ;  Surgeon: Therisa Bi, MD;  Location: [facility] ENDOSCOPY;  Service: Gastroenterology;  Laterality: N/A;  . HERNIA REPAIR      Social History   Socioeconomic History  . Marital status: Legally Separated    Spouse name: Not on file  . Number of children: 8  . Years of education: Not on file  . Highest education level: Not on file  Occupational History  . Occupation: Unemployed  Tobacco Use  . Smoking status: Never  . Smokeless tobacco: Never  Vaping Use  . Vaping status: Never Used  Substance and Sexual Activity  . Alcohol use: No    Alcohol/week: 0.0 standard drinks of alcohol  . Drug use: No  . Sexual activity: Not Currently    Partners: Female  Other Topics Concern  . Not on file  Social History Narrative   Patient and his brother live together-his brother works. Mr. Hershal does not work.  Has significant other.        Has 5 adult sons and 3 daughters.      Brother has recently travelled to [place]      Son lives about 2hrs away.   Social Drivers of Corporate investment banker Strain: Not  on file  Food Insecurity: No Food Insecurity (07/05/2022)   Hunger Vital Sign   . Worried About Programme researcher, broadcasting/film/video in the Last Year: Never true   . Ran Out of Food in the Last Year: Never true  Transportation Needs: No Transportation Needs (07/05/2022)   PRAPARE - Transportation   . Lack of Transportation (Medical): No   . Lack of Transportation (Non-Medical): No  Physical Activity: Inactive (02/10/2018)   Exercise Vital Sign   . Days of Exercise per Week: 0 days   . Minutes of Exercise per Session: 0 min  Stress: No Stress Concern Present (02/10/2018)   [employer] of Occupational Health - Occupational Stress Questionnaire   . Feeling of Stress : Not at all  Social Connections: Moderately Isolated (02/10/2018)   Social Connection and Isolation Panel   . Frequency of Communication with Friends and Family: Twice a week   . Frequency of Social Gatherings with Friends and Family: Twice a week   . Attends Religious Services: Never   . Active Member of Clubs or Organizations: No   . Attends Banker Meetings: Never   . Marital Status: Separated  Intimate Partner Violence: Not At Risk (07/05/2022)   Humiliation, Afraid, Rape, and Kick questionnaire   . Fear of Current or Ex-Partner: No   .  Emotionally Abused: No   . Physically Abused: No   . Sexually Abused: No    Family History  Problem Relation Age of Onset  . Heart attack Mother   . Diabetes Mother   . Hypertension Mother   . Heart disease Father   . Diabetes Father   . Hypertension Father   . Diabetes Sister   . Hypertension Sister   . Diabetes Son   . Diabetes Son   . Hip dysplasia Daughter     Allergies  Allergen Reactions  . Gadolinium Derivatives Itching    Pt began itching across ribs and back and neck Seibert Keeter/p Multihance  injection    Outpatient Medications Prior to Visit  Medication Sig  . atorvastatin  (LIPITOR) 40 MG tablet Take 1 tablet (40 mg total) by mouth daily.  . Continuous Glucose Sensor  (FREESTYLE LIBRE 3 PLUS SENSOR) MISC Change sensor every 15 days.  . glimepiride  (AMARYL ) 4 MG tablet Take 1 tablet by mouth twice daily  . glucose blood (ACCU-CHEK GUIDE) test strip Use as instructed  . JARDIANCE  25 MG TABS tablet Take 1 tablet by mouth once daily  . metFORMIN  (GLUCOPHAGE ) 1000 MG tablet Take 1 tablet by mouth twice daily  . naproxen  (EC NAPROSYN ) 500 MG EC tablet Take 500 mg by mouth 2 (two) times daily with a meal.  . pioglitazone  (ACTOS ) 45 MG tablet TAKE 1 TABLET BY MOUTH IN THE MORNING  . RYBELSUS  14 MG TABS Take 1 tablet by mouth once daily  . tamsulosin  (FLOMAX ) 0.4 MG CAPS capsule Take 1 capsule (0.4 mg total) by mouth daily after supper.  . valsartan -hydrochlorothiazide  (DIOVAN -HCT) 320-12.5 MG tablet Take 1 tablet by mouth daily.  . acetaminophen  (TYLENOL ) 325 MG tablet Take 2 tablets (650 mg total) by mouth every 6 (six) hours as needed for mild pain or headache (fever >/= 101). (Patient not taking: Reported on 03/26/2024)  . albuterol  (VENTOLIN  HFA) 108 (90 Base) MCG/ACT inhaler Inhale 2 puffs into the lungs every 6 (six) hours as needed. (Patient not taking: Reported on 03/26/2024)  . allopurinol  (ZYLOPRIM ) 300 MG tablet Take 1 tablet by mouth once daily (Patient not taking: Reported on 03/26/2024)  . lactulose  (CEPHULAC ) 20 g packet Take 1 packet (20 g total) by mouth daily as needed (constipation). (Patient not taking: Reported on 03/26/2024)  . meloxicam  (MOBIC ) 15 MG tablet Take 15 mg by mouth daily. (Patient not taking: Reported on 03/26/2024)  . pantoprazole  (PROTONIX ) 40 MG tablet Take 1 tablet (40 mg total) by mouth daily. (Patient not taking: Reported on 03/26/2024)   No facility-administered medications prior to visit.    Review of Systems  Constitutional:  Negative for weight loss (gained 3 lbs).  HENT: Negative.    Eyes: Negative.   Respiratory: Negative.  Negative for shortness of breath.   Cardiovascular: Negative.   Gastrointestinal: Negative.    Genitourinary: Negative.   Musculoskeletal:  Positive for back pain.  Skin: Negative.   Neurological: Negative.   Endo/Heme/Allergies: Negative.        Objective:   BP 122/64   Pulse 81   Temp 98.1 F (36.7 C)   Ht 5' 5 (1.651 m)   Wt 163 lb 3.2 oz (74 kg)   SpO2 97%   BMI 27.16 kg/m   Vitals:   03/26/24 1532  BP: 122/64  Pulse: 81  Temp: 98.1 F (36.7 C)  Height: 5' 5 (1.651 m)  Weight: 163 lb 3.2 oz (74 kg)  SpO2: 97%  BMI (Calculated): 27.16  Physical Exam Vitals reviewed.  Constitutional:      Appearance: Normal appearance.  HENT:     Head: Normocephalic.     Left Ear: There is no impacted cerumen.     Nose: Nose normal.     Mouth/Throat:     Mouth: Mucous membranes are moist.     Pharynx: No posterior oropharyngeal erythema.  Eyes:     General: Visual field deficit present.     Extraocular Movements: Extraocular movements intact.     Pupils: Pupils are equal, round, and reactive to light.  Cardiovascular:     Rate and Rhythm: Regular rhythm.     Chest Wall: PMI is not displaced.     Pulses: Normal pulses.     Heart sounds: Normal heart sounds. No murmur heard. Pulmonary:     Effort: Pulmonary effort is normal.     Breath sounds: Normal air entry. No rhonchi or rales.  Abdominal:     General: Abdomen is flat. Bowel sounds are normal. There is no distension.     Palpations: Abdomen is soft. There is no hepatomegaly, splenomegaly or mass.     Tenderness: There is no abdominal tenderness.  Musculoskeletal:     Cervical back: Normal range of motion and neck supple.     Lumbar back: Positive right straight leg raise test and positive left straight leg raise test.     Right lower leg: 1+ Edema present.     Left lower leg: 1+ Edema present.  Skin:    General: Skin is warm and dry.  Neurological:     General: No focal deficit present.     Mental Status: He is alert.     Cranial Nerves: Cranial nerve deficit (reduced visual acuity) present.      Motor: No weakness.  Psychiatric:        Mood and Affect: Mood normal.        Behavior: Behavior normal.      Results for orders placed or performed in visit on 03/26/24  POCT CBG (Fasting - Glucose)  Result Value Ref Range   Glucose Fasting, POC 173 (A) 70 - 99 mg/dL    Recent Results (from the past 2160 hours)  POCT Glucose (CBG)     Status: Abnormal   Collection Time: 01/05/24  3:32 PM  Result Value Ref Range   POC Glucose 185 (A) 70 - 99 mg/dl  POCT CBG (Fasting - Glucose)     Status: Abnormal   Collection Time: 03/15/24  3:34 PM  Result Value Ref Range   Glucose Fasting, POC 183 (A) 70 - 99 mg/dL  Comprehensive metabolic panel with GFR     Status: Abnormal   Collection Time: 03/22/24 12:53 PM  Result Value Ref Range   Glucose 80 70 - 99 mg/dL   BUN 8 8 - 27 mg/dL   Creatinine, Ser 8.75 0.76 - 1.27 mg/dL   eGFR 64 >40 fO/fpw/8.26   BUN/Creatinine Ratio 6 (L) 10 - 24   Sodium 139 134 - 144 mmol/L   Potassium 4.0 3.5 - 5.2 mmol/L   Chloride 102 96 - 106 mmol/L   CO2 24 20 - 29 mmol/L   Calcium  9.7 8.6 - 10.2 mg/dL   Total Protein 6.9 6.0 - 8.5 g/dL   Albumin 4.5 3.9 - 4.9 g/dL   Globulin, Total 2.4 1.5 - 4.5 g/dL   Bilirubin Total 0.5 0.0 - 1.2 mg/dL   Alkaline Phosphatase 77 47 - 123 IU/L   AST 18  0 - 40 IU/L   ALT 9 0 - 44 IU/L  Lipid panel     Status: None   Collection Time: 03/22/24 12:54 PM  Result Value Ref Range   Cholesterol, Total 135 100 - 199 mg/dL   Triglycerides 40 0 - 149 mg/dL   HDL 61 >60 mg/dL   VLDL Cholesterol Cal 10 5 - 40 mg/dL   LDL Chol Calc (NIH) 64 0 - 99 mg/dL   Chol/HDL Ratio 2.2 0.0 - 5.0 ratio    Comment:                                   T. Chol/HDL Ratio                                             Men  Women                               1/2 Avg.Risk  3.4    3.3                                   Avg.Risk  5.0    4.4                                2X Avg.Risk  9.6    7.1                                3X Avg.Risk 23.4   11.0    Hemoglobin A1c     Status: Abnormal   Collection Time: 03/22/24  2:22 PM  Result Value Ref Range   Hgb A1c MFr Bld 6.4 (H) 4.8 - 5.6 %    Comment:          Prediabetes: 5.7 - 6.4          Diabetes: >6.4          Glycemic control for adults with diabetes: <7.0    Est. average glucose Bld gHb Est-mCnc 137 mg/dL  POCT CBG (Fasting - Glucose)     Status: Abnormal   Collection Time: 03/26/24  3:40 PM  Result Value Ref Range   Glucose Fasting, POC 173 (A) 70 - 99 mg/dL      Assessment & Plan:   Problem List Items Addressed This Visit       Endocrine   Type 2 diabetes mellitus with peripheral neuropathy (HCC) - Primary   Relevant Medications   cyclobenzaprine (FLEXERIL) 5 MG tablet   Other Relevant Orders   POCT CBG (Fasting - Glucose) (Completed)   Other Visit Diagnoses       Bulging lumbar disc       Relevant Medications   methylPREDNISolone  (MEDROL  DOSEPAK) 4 MG TBPK tablet   cyclobenzaprine (FLEXERIL) 5 MG tablet   Other Relevant Orders   CT Lumbar Spine Wo Contrast       Return in about 3 months (around 06/26/2024) for fu with labs prior.   Total time spent: 30 minutes  Sherrill Cinderella Perry, MD  03/26/2024   This  document may have been prepared by [employer] Voice Recognition software and as such may include unintentional dictation errors.

## 2024-04-14 ENCOUNTER — Ambulatory Visit
Admission: RE | Admit: 2024-04-14 | Discharge: 2024-04-14 | Disposition: A | Discharge status: Home / Self Care | Source: Ambulatory Visit | Attending: Internal Medicine | Admitting: Internal Medicine

## 2024-04-14 DIAGNOSIS — M51369 Other intervertebral disc degeneration, lumbar region without mention of lumbar back pain or lower extremity pain: Secondary | ICD-10-CM

## 2024-04-16 ENCOUNTER — Telehealth: Payer: Self-pay

## 2024-04-16 NOTE — Telephone Encounter (Signed)
 Just wanted to let you know the patient will not qualify for the CGM since the patient is not on 1-2 injections daily, no attempt for the PA will be done

## 2024-04-20 ENCOUNTER — Ambulatory Visit (INDEPENDENT_AMBULATORY_CARE_PROVIDER_SITE_OTHER): Admitting: Internal Medicine

## 2024-04-20 ENCOUNTER — Ambulatory Visit: Payer: Self-pay | Admitting: Internal Medicine

## 2024-04-20 ENCOUNTER — Ambulatory Visit

## 2024-04-21 NOTE — Progress Notes (Signed)
Pt informed

## 2024-05-10 ENCOUNTER — Other Ambulatory Visit: Payer: Self-pay | Admitting: Internal Medicine

## 2024-05-10 DIAGNOSIS — E1142 Type 2 diabetes mellitus with diabetic polyneuropathy: Secondary | ICD-10-CM

## 2024-06-11 ENCOUNTER — Other Ambulatory Visit: Payer: Self-pay | Admitting: Internal Medicine

## 2024-06-11 DIAGNOSIS — E1142 Type 2 diabetes mellitus with diabetic polyneuropathy: Secondary | ICD-10-CM

## 2024-07-02 ENCOUNTER — Encounter: Payer: Self-pay | Admitting: Internal Medicine

## 2024-07-02 ENCOUNTER — Ambulatory Visit: Admitting: Internal Medicine

## 2024-07-02 VITALS — BP 162/74 | HR 95 | Temp 97.4°F | Ht 66.0 in | Wt 162.4 lb

## 2024-07-02 DIAGNOSIS — R351 Nocturia: Secondary | ICD-10-CM | POA: Diagnosis not present

## 2024-07-02 DIAGNOSIS — N401 Enlarged prostate with lower urinary tract symptoms: Secondary | ICD-10-CM | POA: Diagnosis not present

## 2024-07-02 DIAGNOSIS — E785 Hyperlipidemia, unspecified: Secondary | ICD-10-CM

## 2024-07-02 DIAGNOSIS — E1142 Type 2 diabetes mellitus with diabetic polyneuropathy: Secondary | ICD-10-CM | POA: Diagnosis not present

## 2024-07-02 DIAGNOSIS — R809 Proteinuria, unspecified: Secondary | ICD-10-CM

## 2024-07-02 DIAGNOSIS — E1129 Type 2 diabetes mellitus with other diabetic kidney complication: Secondary | ICD-10-CM

## 2024-07-02 DIAGNOSIS — I10 Essential (primary) hypertension: Secondary | ICD-10-CM | POA: Diagnosis not present

## 2024-07-02 DIAGNOSIS — I872 Venous insufficiency (chronic) (peripheral): Secondary | ICD-10-CM

## 2024-07-02 LAB — POCT CBG (FASTING - GLUCOSE)-MANUAL ENTRY: Glucose Fasting, POC: 370 mg/dL — AB (ref 70–99)

## 2024-07-02 MED ORDER — EMPAGLIFLOZIN 25 MG PO TABS: 25.0000 mg | ORAL_TABLET | Freq: Every day | ORAL | 0 refills | Status: AC

## 2024-07-02 MED ORDER — METFORMIN HCL 1000 MG PO TABS: 1000.0000 mg | ORAL_TABLET | Freq: Two times a day (BID) | ORAL | 0 refills | Status: AC

## 2024-07-02 MED ORDER — VALSARTAN-HYDROCHLOROTHIAZIDE 320-12.5 MG PO TABS: 1.0000 | ORAL_TABLET | Freq: Every day | ORAL | 0 refills | Status: DC

## 2024-07-02 MED ORDER — GLIMEPIRIDE 4 MG PO TABS: 4.0000 mg | ORAL_TABLET | Freq: Two times a day (BID) | ORAL | 0 refills | Status: AC

## 2024-07-02 MED ORDER — SILODOSIN 4 MG PO CAPS: 4.0000 mg | ORAL_CAPSULE | Freq: Every day | ORAL | 0 refills | Status: DC

## 2024-07-02 MED ORDER — RYBELSUS 14 MG PO TABS: 1.0000 | ORAL_TABLET | Freq: Every day | ORAL | 0 refills | Status: AC

## 2024-07-02 MED ORDER — PIOGLITAZONE HCL 45 MG PO TABS: 45.0000 mg | ORAL_TABLET | Freq: Every morning | ORAL | 0 refills | Status: AC

## 2024-07-02 MED ORDER — ALLOPURINOL 300 MG PO TABS: 300.0000 mg | ORAL_TABLET | Freq: Every day | ORAL | 0 refills | Status: AC

## 2024-07-02 MED ORDER — ATORVASTATIN CALCIUM 40 MG PO TABS: 40.0000 mg | ORAL_TABLET | Freq: Every day | ORAL | 1 refills | Status: AC

## 2024-07-02 MED ORDER — OLMESARTAN MEDOXOMIL 40 MG PO TABS: 40.0000 mg | ORAL_TABLET | Freq: Every day | ORAL | 0 refills | Status: AC

## 2024-07-02 MED ORDER — FREESTYLE LIBRE 3 PLUS SENSOR MISC: 11 refills | Status: DC

## 2024-07-02 NOTE — Progress Notes (Unsigned)
 "  Established Patient Office Visit  Subjective:  Patient ID: Daniel Powell, male    DOB: 17-Nov-1955  Age: 69 y.o. MRN: [id number]  Chief Complaint  Patient presents with   Results    3 month lab results     No new complaints, here for lab review and medication refills. Stopped taking Valsartan  hydrochlorothiazide  due to patient preference.     No other concerns at this time.   Past Medical History:  Diagnosis Date   Diabetes mellitus without complication (HCC)    Hypertension     Past Surgical History:  Procedure Laterality Date   BACK SURGERY  2014   COLONOSCOPY WITH PROPOFOL  N/A 03/11/2018   Procedure: COLONOSCOPY WITH PROPOFOL ;  Surgeon: Therisa Bi, MD;  Location: [facility] ENDOSCOPY;  Service: Gastroenterology;  Laterality: N/A;   HERNIA REPAIR      Social History   Socioeconomic History   Marital status: Legally Separated    Spouse name: Not on file   Number of children: 8   Years of education: Not on file   Highest education level: Not on file  Occupational History   Occupation: Unemployed  Tobacco Use   Smoking status: Never   Smokeless tobacco: Never  Vaping Use   Vaping status: Never Used  Substance and Sexual Activity   Alcohol use: No    Alcohol/week: 0.0 standard drinks of alcohol   Drug use: No   Sexual activity: Not Currently    Partners: Female  Other Topics Concern   Not on file  Social History Narrative   Patient and his brother live together-his brother works. Mr. Daniel Powell does not work.  Has significant other.        Has 5 adult sons and 3 daughters.      Brother has recently travelled to [place]      Son lives about 2hrs away.   Social Drivers of Health   Tobacco Use: Low Risk (07/02/2024)   Patient History    Smoking Tobacco Use: Never    Smokeless Tobacco Use: Never    Passive Exposure: Not on file  Financial Resource Strain: Not on file  Food Insecurity: No Food Insecurity (07/05/2022)   Hunger Vital Sign    Worried About Running Out  of Food in the Last Year: Never true    Ran Out of Food in the Last Year: Never true  Transportation Needs: No Transportation Needs (07/05/2022)   PRAPARE - Administrator, Civil Service (Medical): No    Lack of Transportation (Non-Medical): No  Physical Activity: Not on file  Stress: Not on file  Social Connections: Not on file  Intimate Partner Violence: Not At Risk (07/05/2022)   Humiliation, Afraid, Rape, and Kick questionnaire    Fear of Current or Ex-Partner: No    Emotionally Abused: No    Physically Abused: No    Sexually Abused: No  Depression (PHQ2-9): Not on file  Alcohol Screen: Not on file  Housing: Low Risk (07/05/2022)   Housing    Last Housing Risk Score: 0  Utilities: Not At Risk (07/05/2022)   AHC Utilities    Threatened with loss of utilities: No  Health Literacy: Not on file    Family History  Problem Relation Age of Onset   Heart attack Mother    Diabetes Mother    Hypertension Mother    Heart disease Father    Diabetes Father    Hypertension Father    Diabetes Sister    Hypertension  Sister    Diabetes Son    Diabetes Son    Hip dysplasia Daughter     Allergies[1]  Show/hide medication list[2]  Review of Systems  Constitutional:  Positive for weight loss (lost 2 lbs).  HENT: Negative.    Eyes: Negative.   Respiratory: Negative.  Negative for shortness of breath.   Cardiovascular: Negative.   Gastrointestinal: Negative.   Genitourinary: Negative.   Musculoskeletal:  Positive for back pain.  Skin: Negative.   Neurological: Negative.   Endo/Heme/Allergies: Negative.        Objective:   BP (!) 162/74   Pulse 95   Temp (!) 97.4 F (36.3 C)   Ht 5' 6 (1.676 m)   Wt 162 lb 6.4 oz (73.7 kg)   SpO2 96%   BMI 26.21 kg/m   Vitals:   07/02/24 1510  BP: (!) 162/74  Pulse: 95  Temp: (!) 97.4 F (36.3 C)  Height: 5' 6 (1.676 m)  Weight: 162 lb 6.4 oz (73.7 kg)  SpO2: 96%  BMI (Calculated): 26.22    Physical  Exam Vitals reviewed.  Constitutional:      Appearance: Normal appearance.  HENT:     Head: Normocephalic.     Left Ear: There is no impacted cerumen.     Nose: Nose normal.     Mouth/Throat:     Mouth: Mucous membranes are moist.     Pharynx: No posterior oropharyngeal erythema.  Eyes:     General: Visual field deficit present.     Extraocular Movements: Extraocular movements intact.     Pupils: Pupils are equal, round, and reactive to light.  Cardiovascular:     Rate and Rhythm: Regular rhythm.     Chest Wall: PMI is not displaced.     Pulses: Normal pulses.     Heart sounds: Normal heart sounds. No murmur heard. Pulmonary:     Effort: Pulmonary effort is normal.     Breath sounds: Normal air entry. No rhonchi or rales.  Abdominal:     General: Abdomen is flat. Bowel sounds are normal. There is no distension.     Palpations: Abdomen is soft. There is no hepatomegaly, splenomegaly or mass.     Tenderness: There is no abdominal tenderness.  Musculoskeletal:     Cervical back: Normal range of motion and neck supple.     Lumbar back: Positive right straight leg raise test and positive left straight leg raise test.     Right lower leg: 1+ Edema present.     Left lower leg: 1+ Edema present.  Skin:    General: Skin is warm and dry.  Neurological:     General: No focal deficit present.     Mental Status: He is alert.     Cranial Nerves: Cranial nerve deficit (reduced visual acuity) present.     Sensory: Sensory deficit present.     Motor: No weakness.  Psychiatric:        Mood and Affect: Mood normal.        Behavior: Behavior normal.      Results for orders placed or performed in visit on 07/02/24  POCT CBG (Fasting - Glucose)  Result Value Ref Range   Glucose Fasting, POC 370 (A) 70 - 99 mg/dL    Recent Results (from the past 2160 hours)  POCT CBG (Fasting - Glucose)     Status: Abnormal   Collection Time: 07/02/24  3:22 PM  Result Value Ref Range   Glucose  Fasting, POC 370 (A) 70 - 99 mg/dL      Assessment & Plan:   Problem List Items Addressed This Visit       Cardiovascular and Mediastinum   HTN (hypertension), benign   Relevant Medications   atorvastatin  (LIPITOR) 40 MG tablet   olmesartan  (BENICAR ) 40 MG tablet     Endocrine   Type 2 diabetes mellitus with peripheral neuropathy (HCC)   Relevant Medications   Semaglutide  (RYBELSUS ) 14 MG TABS   pioglitazone  (ACTOS ) 45 MG tablet   metFORMIN  (GLUCOPHAGE ) 1000 MG tablet   glimepiride  (AMARYL ) 4 MG tablet   empagliflozin  (JARDIANCE ) 25 MG TABS tablet   atorvastatin  (LIPITOR) 40 MG tablet   olmesartan  (BENICAR ) 40 MG tablet   Continuous Glucose Sensor (FREESTYLE LIBRE 3 PLUS SENSOR) MISC   Other Relevant Orders   POCT CBG (Fasting - Glucose) (Completed)   POC CREATINE & ALBUMIN,URINE     Other   Dyslipidemia (Chronic)   Relevant Medications   atorvastatin  (LIPITOR) 40 MG tablet   Benign prostatic hyperplasia with nocturia   Relevant Medications   silodosin  (RAPAFLO ) 4 MG CAPS capsule   Other Visit Diagnoses       Venous insufficiency    -  Primary   Relevant Medications   atorvastatin  (LIPITOR) 40 MG tablet   olmesartan  (BENICAR ) 40 MG tablet   Other Relevant Orders   Compression stockings     Microalbuminuria due to type 2 diabetes mellitus (HCC)       Relevant Medications   Semaglutide  (RYBELSUS ) 14 MG TABS   pioglitazone  (ACTOS ) 45 MG tablet   metFORMIN  (GLUCOPHAGE ) 1000 MG tablet   glimepiride  (AMARYL ) 4 MG tablet   empagliflozin  (JARDIANCE ) 25 MG TABS tablet   atorvastatin  (LIPITOR) 40 MG tablet   olmesartan  (BENICAR ) 40 MG tablet       Return in about 2 weeks (around 07/16/2024) for fu with labs prior.   Total time spent: 30 minutes. This time includes review of previous notes and results and patient face to face interaction during today'Daniel Powell visit.    Sherrill Cinderella Perry, MD  07/02/2024   This document may have been prepared by [facility] Voice  Recognition software and as such may include unintentional dictation errors.     [1]  Allergies Allergen Reactions   Gadolinium Derivatives Itching    Pt began itching across ribs and back and neck Kashmir Lysaght/p Multihance  injection  [2]  Outpatient Medications Prior to Visit  Medication Sig   [DISCONTINUED] atorvastatin  (LIPITOR) 40 MG tablet Take 1 tablet (40 mg total) by mouth daily.   [DISCONTINUED] Continuous Glucose Sensor (FREESTYLE LIBRE 3 PLUS SENSOR) MISC Change sensor every 15 days.   [DISCONTINUED] empagliflozin  (JARDIANCE ) 25 MG TABS tablet Take 1 tablet (25 mg total) by mouth daily.   [DISCONTINUED] glimepiride  (AMARYL ) 4 MG tablet Take 1 tablet (4 mg total) by mouth 2 (two) times daily.   [DISCONTINUED] metFORMIN  (GLUCOPHAGE ) 1000 MG tablet Take 1 tablet (1,000 mg total) by mouth 2 (two) times daily.   [DISCONTINUED] pioglitazone  (ACTOS ) 45 MG tablet Take 1 tablet (45 mg total) by mouth every morning.   [DISCONTINUED] RYBELSUS  14 MG TABS Take 1 tablet by mouth once daily   [DISCONTINUED] valsartan -hydrochlorothiazide  (DIOVAN -HCT) 320-12.5 MG tablet Take 1 tablet by mouth daily.   glucose blood (ACCU-CHEK GUIDE TEST) test strip USE AS DIRECTED (Patient not taking: Reported on 07/02/2024)   lactulose  (CEPHULAC ) 20 g packet Take 1 packet (20 g total) by mouth daily as needed (constipation). (Patient  not taking: Reported on 07/02/2024)   naproxen  (EC NAPROSYN ) 500 MG EC tablet Take 500 mg by mouth 2 (two) times daily with a meal. (Patient not taking: Reported on 07/02/2024)   pantoprazole  (PROTONIX ) 40 MG tablet Take 1 tablet (40 mg total) by mouth daily. (Patient not taking: Reported on 07/02/2024)   [DISCONTINUED] acetaminophen  (TYLENOL ) 325 MG tablet Take 2 tablets (650 mg total) by mouth every 6 (six) hours as needed for mild pain or headache (fever >/= 101). (Patient not taking: Reported on 07/02/2024)   [DISCONTINUED] albuterol  (VENTOLIN  HFA) 108 (90 Base) MCG/ACT inhaler Inhale 2 puffs into the  lungs every 6 (six) hours as needed. (Patient not taking: Reported on 07/02/2024)   [DISCONTINUED] allopurinol  (ZYLOPRIM ) 300 MG tablet Take 1 tablet (300 mg total) by mouth daily. (Patient not taking: Reported on 07/02/2024)   [DISCONTINUED] meloxicam  (MOBIC ) 15 MG tablet Take 15 mg by mouth daily. (Patient not taking: Reported on 07/02/2024)   [DISCONTINUED] methylPREDNISolone  (MEDROL  DOSEPAK) 4 MG TBPK tablet Use as directed (Patient not taking: Reported on 07/02/2024)   [DISCONTINUED] tamsulosin  (FLOMAX ) 0.4 MG CAPS capsule Take 1 capsule (0.4 mg total) by mouth daily after supper. (Patient not taking: Reported on 07/02/2024)   No facility-administered medications prior to visit.   "

## 2024-07-14 ENCOUNTER — Other Ambulatory Visit

## 2024-07-14 DIAGNOSIS — K760 Fatty (change of) liver, not elsewhere classified: Secondary | ICD-10-CM

## 2024-07-14 DIAGNOSIS — E1142 Type 2 diabetes mellitus with diabetic polyneuropathy: Secondary | ICD-10-CM

## 2024-07-15 LAB — LIPID PANEL
Chol/HDL Ratio: 5.1 ratio — ABNORMAL HIGH (ref 0.0–5.0)
Cholesterol, Total: 190 mg/dL (ref 100–199)
HDL: 37 mg/dL — ABNORMAL LOW
LDL Chol Calc (NIH): 112 mg/dL — ABNORMAL HIGH (ref 0–99)
Triglycerides: 233 mg/dL — ABNORMAL HIGH (ref 0–149)
VLDL Cholesterol Cal: 41 mg/dL — ABNORMAL HIGH (ref 5–40)

## 2024-07-15 LAB — HEMOGLOBIN A1C
Est. average glucose Bld gHb Est-mCnc: 183 mg/dL
Hgb A1c MFr Bld: 8 % — ABNORMAL HIGH (ref 4.8–5.6)

## 2024-07-16 ENCOUNTER — Ambulatory Visit: Admitting: Internal Medicine

## 2024-07-16 ENCOUNTER — Ambulatory Visit: Payer: Self-pay | Admitting: Internal Medicine

## 2024-07-16 ENCOUNTER — Encounter: Payer: Self-pay | Admitting: Internal Medicine

## 2024-07-16 VITALS — BP 126/60 | HR 76 | Temp 97.9°F | Ht 66.0 in | Wt 160.8 lb

## 2024-07-16 DIAGNOSIS — R351 Nocturia: Secondary | ICD-10-CM

## 2024-07-16 DIAGNOSIS — E1142 Type 2 diabetes mellitus with diabetic polyneuropathy: Secondary | ICD-10-CM | POA: Diagnosis not present

## 2024-07-16 DIAGNOSIS — N401 Enlarged prostate with lower urinary tract symptoms: Secondary | ICD-10-CM

## 2024-07-16 DIAGNOSIS — Z0130 Encounter for examination of blood pressure without abnormal findings: Secondary | ICD-10-CM

## 2024-07-16 LAB — POCT CBG (FASTING - GLUCOSE)-MANUAL ENTRY: Glucose Fasting, POC: 145 mg/dL — AB (ref 70–99)

## 2024-07-16 MED ORDER — ACCU-CHEK GUIDE TEST VI STRP: ORAL_STRIP | 3 refills | Status: AC

## 2024-07-16 MED ORDER — DEXCOM G7 SENSOR MISC: 1.0000 | 11 refills | Status: AC

## 2024-07-16 MED ORDER — SILODOSIN 4 MG PO CAPS: 4.0000 mg | ORAL_CAPSULE | Freq: Every day | ORAL | 1 refills | Status: AC

## 2024-07-16 MED ORDER — DEXCOM G7 RECEIVER DEVI: 1.0000 | 0 refills | Status: AC

## 2024-07-16 NOTE — Progress Notes (Signed)
 "  Established Patient Office Visit  Subjective:  Patient ID: Daniel Powell, male    DOB: [birth year]  Age: 69 y.o. MRN: [id number]  Chief Complaint  Patient presents with   Follow-up    2 week lab results     No new complaints, here for lab review and medication refills. Labs reviewed and notable for well controlled/uncontrolled diabetes, A1c not at target, lipids not at target. BP has improved. Noncompliant with his meds.     No other concerns at this time.   Past Medical History:  Diagnosis Date   Diabetes mellitus without complication (HCC)    Hypertension     Past Surgical History:  Procedure Laterality Date   BACK SURGERY  2014   COLONOSCOPY WITH PROPOFOL  N/A 03/11/2018   Procedure: COLONOSCOPY WITH PROPOFOL ;  Surgeon: Therisa Bi, MD;  Location: [facility] ENDOSCOPY;  Service: Gastroenterology;  Laterality: N/A;   HERNIA REPAIR      Social History   Socioeconomic History   Marital status: Legally Separated    Spouse name: Not on file   Number of children: 8   Years of education: Not on file   Highest education level: Not on file  Occupational History   Occupation: Unemployed  Tobacco Use   Smoking status: Never   Smokeless tobacco: Never  Vaping Use   Vaping status: Never Used  Substance and Sexual Activity   Alcohol use: No    Alcohol/week: 0.0 standard drinks of alcohol   Drug use: No   Sexual activity: Not Currently    Partners: Female  Other Topics Concern   Not on file  Social History Narrative   Patient and his brother live together-his brother works. Daniel Powell does not work.  Has significant other.        Has 5 adult sons and 3 daughters.      Brother has recently travelled to [place]      Son lives about 2hrs away.   Social Drivers of Health   Tobacco Use: Low Risk (07/02/2024)   Patient History    Smoking Tobacco Use: Never    Smokeless Tobacco Use: Never    Passive Exposure: Not on file  Financial Resource Strain: Not on file  Food  Insecurity: No Food Insecurity (07/05/2022)   Hunger Vital Sign    Worried About Running Out of Food in the Last Year: Never true    Ran Out of Food in the Last Year: Never true  Transportation Needs: No Transportation Needs (07/05/2022)   PRAPARE - Administrator, Civil Service (Medical): No    Lack of Transportation (Non-Medical): No  Physical Activity: Not on file  Stress: Not on file  Social Connections: Not on file  Intimate Partner Violence: Not At Risk (07/05/2022)   Humiliation, Afraid, Rape, and Kick questionnaire    Fear of Current or Ex-Partner: No    Emotionally Abused: No    Physically Abused: No    Sexually Abused: No  Depression (PHQ2-9): Not on file  Alcohol Screen: Not on file  Housing: Low Risk (07/05/2022)   Housing    Last Housing Risk Score: 0  Utilities: Not At Risk (07/05/2022)   AHC Utilities    Threatened with loss of utilities: No  Health Literacy: Not on file    Family History  Problem Relation Age of Onset   Heart attack Mother    Diabetes Mother    Hypertension Mother    Heart disease Father    Diabetes  Father    Hypertension Father    Diabetes Sister    Hypertension Sister    Diabetes Son    Diabetes Son    Hip dysplasia Daughter     Allergies[1]  Show/hide medication list[2]  Review of Systems  Constitutional:  Positive for weight loss (2 lbs).  HENT: Negative.    Eyes: Negative.   Respiratory: Negative.  Negative for shortness of breath.   Cardiovascular: Negative.   Gastrointestinal: Negative.   Genitourinary: Negative.   Musculoskeletal:  Positive for back pain.  Skin: Negative.   Neurological: Negative.   Endo/Heme/Allergies: Negative.        Objective:   BP 126/60   Pulse 76   Temp 97.9 F (36.6 C)   Ht 5' 6 (1.676 m)   Wt 160 lb 12.8 oz (72.9 kg)   SpO2 98%   BMI 25.95 kg/m   Vitals:   07/16/24 1427  BP: 126/60  Pulse: 76  Temp: 97.9 F (36.6 C)  Height: 5' 6 (1.676 m)  Weight: 160 lb 12.8  oz (72.9 kg)  SpO2: 98%  BMI (Calculated): 25.97    Physical Exam Vitals reviewed.  Constitutional:      Appearance: Normal appearance.  HENT:     Head: Normocephalic.     Left Ear: There is no impacted cerumen.     Nose: Nose normal.     Mouth/Throat:     Mouth: Mucous membranes are moist.     Pharynx: No posterior oropharyngeal erythema.  Eyes:     General: Visual field deficit present.     Extraocular Movements: Extraocular movements intact.     Pupils: Pupils are equal, round, and reactive to light.  Cardiovascular:     Rate and Rhythm: Regular rhythm.     Chest Wall: PMI is not displaced.     Pulses: Normal pulses.     Heart sounds: Normal heart sounds. No murmur heard. Pulmonary:     Effort: Pulmonary effort is normal.     Breath sounds: Normal air entry. No rhonchi or rales.  Abdominal:     General: Abdomen is flat. Bowel sounds are normal. There is no distension.     Palpations: Abdomen is soft. There is no hepatomegaly, splenomegaly or mass.     Tenderness: There is no abdominal tenderness.  Musculoskeletal:     Cervical back: Normal range of motion and neck supple.     Lumbar back: Positive right straight leg raise test and positive left straight leg raise test.     Right lower leg: 1+ Edema present.     Left lower leg: 1+ Edema present.  Skin:    General: Skin is warm and dry.  Neurological:     General: No focal deficit present.     Mental Status: He is alert.     Cranial Nerves: Cranial nerve deficit (reduced visual acuity) present.     Sensory: Sensory deficit present.     Motor: No weakness.  Psychiatric:        Mood and Affect: Mood normal.        Behavior: Behavior normal.      Results for orders placed or performed in visit on 07/16/24  POCT CBG (Fasting - Glucose)  Result Value Ref Range   Glucose Fasting, POC 145 (A) 70 - 99 mg/dL    Recent Results (from the past 2160 hours)  POCT CBG (Fasting - Glucose)     Status: Abnormal   Collection  Time: 07/02/24  3:22 PM  Result Value Ref Range   Glucose Fasting, POC 370 (A) 70 - 99 mg/dL  Lipid panel     Status: Abnormal   Collection Time: 07/14/24  9:33 AM  Result Value Ref Range   Cholesterol, Total 190 100 - 199 mg/dL   Triglycerides 766 (H) 0 - 149 mg/dL   HDL 37 (L) >60 mg/dL   VLDL Cholesterol Cal 41 (H) 5 - 40 mg/dL   LDL Chol Calc (NIH) 887 (H) 0 - 99 mg/dL   Chol/HDL Ratio 5.1 (H) 0.0 - 5.0 ratio    Comment:                                   T. Chol/HDL Ratio                                             Men  Women                               1/2 Avg.Risk  3.4    3.3                                   Avg.Risk  5.0    4.4                                2X Avg.Risk  9.6    7.1                                3X Avg.Risk 23.4   11.0   Hemoglobin A1c     Status: Abnormal   Collection Time: 07/14/24  9:33 AM  Result Value Ref Range   Hgb A1c MFr Bld 8.0 (H) 4.8 - 5.6 %    Comment:          Prediabetes: 5.7 - 6.4          Diabetes: >6.4          Glycemic control for adults with diabetes: <7.0    Est. average glucose Bld gHb Est-mCnc 183 mg/dL  POCT CBG (Fasting - Glucose)     Status: Abnormal   Collection Time: 07/16/24  2:34 PM  Result Value Ref Range   Glucose Fasting, POC 145 (A) 70 - 99 mg/dL      Assessment & Plan:  Rosalio was seen today for follow-up.  Type 2 diabetes mellitus with peripheral neuropathy (HCC) -     POCT CBG (Fasting - Glucose) -     Fructosamine -     Dexcom G7 Sensor; 1 Device by Does not apply route as directed. Apply new sensor every 10 days  Dispense: 3 each; Refill: 11 -     Dexcom G7 Receiver; 1 Device by Does not apply route as directed.  Dispense: 1 each; Refill: 0 -     Accu-Chek Guide Test; Test bid  Dispense: 100 each; Refill: 3  Benign prostatic hyperplasia with nocturia -     Silodosin ; Take 1 capsule (4 mg total) by mouth daily with breakfast.  Dispense:  30 capsule; Refill: 1   Patient instructed to comply with medications  as prescribed  Problem List Items Addressed This Visit       Endocrine   Type 2 diabetes mellitus with peripheral neuropathy (HCC) - Primary   Relevant Medications   cyclobenzaprine  (FLEXERIL ) 5 MG tablet   Continuous Glucose Sensor (DEXCOM G7 SENSOR) MISC   Continuous Glucose Receiver (DEXCOM G7 RECEIVER) DEVI   glucose blood (ACCU-CHEK GUIDE TEST) test strip   Other Relevant Orders   POCT CBG (Fasting - Glucose) (Completed)   Fructosamine     Other   Benign prostatic hyperplasia with nocturia   Relevant Medications   silodosin  (RAPAFLO ) 4 MG CAPS capsule    Return in about 6 weeks (around 08/27/2024) for fu with labs prior.   Total time spent: 20 minutes. This time includes review of previous notes and results and patient face to face interaction during today'Leilana Mcquire visit.    Sherrill Cinderella Perry, MD  07/16/2024   This document may have been prepared by [facility]: [facility] Voice Recognition software and as such may include unintentional dictation errors.     [1]  Allergies Allergen Reactions   Gadolinium Derivatives Itching    Pt began itching across ribs and back and neck Manmeet Arzola/p Multihance  injection  [2]  Outpatient Medications Prior to Visit  Medication Sig   atorvastatin  (LIPITOR) 40 MG tablet Take 1 tablet (40 mg total) by mouth daily.   cyclobenzaprine  (FLEXERIL ) 5 MG tablet Take 5 mg by mouth 3 (three) times daily as needed for muscle spasms.   empagliflozin  (JARDIANCE ) 25 MG TABS tablet Take 1 tablet (25 mg total) by mouth daily.   glimepiride  (AMARYL ) 4 MG tablet Take 1 tablet (4 mg total) by mouth 2 (two) times daily.   metFORMIN  (GLUCOPHAGE ) 1000 MG tablet Take 1 tablet (1,000 mg total) by mouth 2 (two) times daily.   olmesartan  (BENICAR ) 40 MG tablet Take 1 tablet (40 mg total) by mouth daily.   pioglitazone  (ACTOS ) 45 MG tablet Take 1 tablet (45 mg total) by mouth every morning.   Semaglutide  (RYBELSUS ) 14 MG TABS Take 1 tablet (14 mg total) by mouth daily.    [DISCONTINUED] Continuous Glucose Sensor (FREESTYLE LIBRE 3 PLUS SENSOR) MISC Change sensor every 15 days.   allopurinol  (ZYLOPRIM ) 300 MG tablet Take 1 tablet (300 mg total) by mouth daily. (Patient not taking: Reported on 07/16/2024)   lactulose  (CEPHULAC ) 20 g packet Take 1 packet (20 g total) by mouth daily as needed (constipation). (Patient not taking: Reported on 07/16/2024)   naproxen  (EC NAPROSYN ) 500 MG EC tablet Take 500 mg by mouth 2 (two) times daily with a meal. (Patient not taking: Reported on 07/16/2024)   pantoprazole  (PROTONIX ) 40 MG tablet Take 1 tablet (40 mg total) by mouth daily. (Patient not taking: Reported on 07/16/2024)   [DISCONTINUED] glucose blood (ACCU-CHEK GUIDE TEST) test strip USE AS DIRECTED (Patient not taking: Reported on 07/16/2024)   [DISCONTINUED] silodosin  (RAPAFLO ) 4 MG CAPS capsule Take 1 capsule (4 mg total) by mouth daily with breakfast. (Patient not taking: Reported on 07/16/2024)   No facility-administered medications prior to visit.   "

## 2024-09-03 ENCOUNTER — Ambulatory Visit: Admitting: Internal Medicine
